# Patient Record
Sex: Female | Born: 1945 | Race: White | Hispanic: No | Smoking: Former smoker
Health system: Southern US, Community
[De-identification: ages and names within clinical notes are randomized; demographics above are authoritative.]

## PROBLEM LIST (undated history)

## (undated) DIAGNOSIS — C419 Malignant neoplasm of bone and articular cartilage, unspecified: Secondary | ICD-10-CM

## (undated) DIAGNOSIS — K219 Gastro-esophageal reflux disease without esophagitis: Secondary | ICD-10-CM

## (undated) DIAGNOSIS — C229 Malignant neoplasm of liver, not specified as primary or secondary: Secondary | ICD-10-CM

## (undated) DIAGNOSIS — I509 Heart failure, unspecified: Secondary | ICD-10-CM

## (undated) DIAGNOSIS — G4730 Sleep apnea, unspecified: Secondary | ICD-10-CM

## (undated) DIAGNOSIS — I10 Essential (primary) hypertension: Secondary | ICD-10-CM

## (undated) DIAGNOSIS — J449 Chronic obstructive pulmonary disease, unspecified: Secondary | ICD-10-CM

## (undated) DIAGNOSIS — E119 Type 2 diabetes mellitus without complications: Secondary | ICD-10-CM

## (undated) DIAGNOSIS — C801 Malignant (primary) neoplasm, unspecified: Secondary | ICD-10-CM

## (undated) DIAGNOSIS — H9209 Otalgia, unspecified ear: Secondary | ICD-10-CM

## (undated) DIAGNOSIS — C539 Malignant neoplasm of cervix uteri, unspecified: Secondary | ICD-10-CM

## (undated) DIAGNOSIS — H43399 Other vitreous opacities, unspecified eye: Secondary | ICD-10-CM

## (undated) DIAGNOSIS — N189 Chronic kidney disease, unspecified: Secondary | ICD-10-CM

## (undated) HISTORY — DX: Chronic obstructive pulmonary disease, unspecified: J44.9

## (undated) HISTORY — DX: Malignant neoplasm of bone and articular cartilage, unspecified: C41.9

## (undated) HISTORY — DX: Chronic kidney disease, unspecified: N18.9

## (undated) HISTORY — DX: Hypercalcemia: E83.52

## (undated) HISTORY — PX: EYE SURGERY: SHX253

## (undated) HISTORY — DX: Other vitreous opacities, unspecified eye: H43.399

## (undated) HISTORY — DX: Otalgia, unspecified ear: H92.09

## (undated) HISTORY — DX: Gastro-esophageal reflux disease without esophagitis: K21.9

## (undated) HISTORY — DX: Malignant (primary) neoplasm, unspecified: C80.1

## (undated) HISTORY — DX: Malignant neoplasm of cervix uteri, unspecified: C53.9

## (undated) HISTORY — PX: ABDOMINAL HYSTERECTOMY: SHX81

## (undated) HISTORY — DX: Type 2 diabetes mellitus without complications: E11.9

## (undated) HISTORY — DX: Essential (primary) hypertension: I10

## (undated) HISTORY — PX: BREAST SURGERY: SHX581

## (undated) HISTORY — DX: Malignant neoplasm of liver, not specified as primary or secondary: C22.9

---

## 2005-04-17 ENCOUNTER — Emergency Department (HOSPITAL_COMMUNITY): Admission: EM | Admit: 2005-04-17 | Discharge: 2005-04-17 | Payer: Self-pay | Admitting: Emergency Medicine

## 2005-11-03 ENCOUNTER — Ambulatory Visit: Payer: Self-pay | Admitting: Family Medicine

## 2005-11-28 ENCOUNTER — Ambulatory Visit: Payer: Self-pay | Admitting: Family Medicine

## 2005-12-28 ENCOUNTER — Ambulatory Visit: Payer: Self-pay | Admitting: Family Medicine

## 2006-05-28 ENCOUNTER — Other Ambulatory Visit: Payer: Self-pay

## 2006-05-28 ENCOUNTER — Emergency Department: Payer: Self-pay | Admitting: Emergency Medicine

## 2007-01-17 ENCOUNTER — Emergency Department (HOSPITAL_COMMUNITY): Admission: EM | Admit: 2007-01-17 | Discharge: 2007-01-17 | Payer: Self-pay | Admitting: Emergency Medicine

## 2007-01-18 ENCOUNTER — Ambulatory Visit (HOSPITAL_COMMUNITY): Admission: RE | Admit: 2007-01-18 | Discharge: 2007-01-18 | Payer: Self-pay | Admitting: Emergency Medicine

## 2007-11-12 ENCOUNTER — Emergency Department (HOSPITAL_COMMUNITY): Admission: EM | Admit: 2007-11-12 | Discharge: 2007-11-12 | Payer: Self-pay | Admitting: Emergency Medicine

## 2008-03-07 ENCOUNTER — Emergency Department: Payer: Self-pay | Admitting: Emergency Medicine

## 2008-03-07 ENCOUNTER — Other Ambulatory Visit: Payer: Self-pay

## 2009-11-10 ENCOUNTER — Emergency Department (HOSPITAL_COMMUNITY): Admission: EM | Admit: 2009-11-10 | Discharge: 2009-11-10 | Payer: Self-pay | Admitting: Emergency Medicine

## 2010-02-05 ENCOUNTER — Emergency Department: Payer: Self-pay | Admitting: Emergency Medicine

## 2010-03-30 ENCOUNTER — Ambulatory Visit: Payer: Self-pay | Admitting: Internal Medicine

## 2010-04-10 ENCOUNTER — Inpatient Hospital Stay: Payer: Self-pay | Admitting: Internal Medicine

## 2010-04-30 ENCOUNTER — Ambulatory Visit: Payer: Self-pay | Admitting: Internal Medicine

## 2010-06-23 ENCOUNTER — Inpatient Hospital Stay: Payer: Self-pay | Admitting: *Deleted

## 2010-08-30 ENCOUNTER — Observation Stay: Payer: Self-pay | Admitting: Internal Medicine

## 2010-09-17 LAB — DIFFERENTIAL
Basophils Relative: 1 % (ref 0–1)
Eosinophils Absolute: 0.7 10*3/uL (ref 0.0–0.7)
Eosinophils Relative: 7 % — ABNORMAL HIGH (ref 0–5)
Monocytes Relative: 6 % (ref 3–12)

## 2010-09-17 LAB — BASIC METABOLIC PANEL
CO2: 24 mEq/L (ref 19–32)
Calcium: 8.7 mg/dL (ref 8.4–10.5)
GFR calc Af Amer: 52 mL/min — ABNORMAL LOW (ref >60)
GFR calc non Af Amer: 43 mL/min — ABNORMAL LOW (ref >60)
Glucose, Bld: 188 mg/dL — ABNORMAL HIGH (ref 70–99)
Potassium: 4 mEq/L (ref 3.5–5.1)
Sodium: 138 mEq/L (ref 135–145)

## 2010-09-17 LAB — CBC
Platelets: 270 10*3/uL (ref 150–400)
RBC: 3.42 MIL/uL — ABNORMAL LOW (ref 3.87–5.11)

## 2010-12-13 ENCOUNTER — Inpatient Hospital Stay: Payer: Self-pay | Admitting: Internal Medicine

## 2011-02-06 ENCOUNTER — Emergency Department: Payer: Self-pay | Admitting: Internal Medicine

## 2011-03-26 LAB — URINALYSIS, ROUTINE W REFLEX MICROSCOPIC
Bilirubin Urine: NEGATIVE
pH: 7

## 2011-04-14 LAB — URINALYSIS, ROUTINE W REFLEX MICROSCOPIC
Bilirubin Urine: NEGATIVE
Glucose, UA: NEGATIVE
Hgb urine dipstick: NEGATIVE
Nitrite: NEGATIVE
Specific Gravity, Urine: 1.025
Urobilinogen, UA: 0.2
pH: 6

## 2011-04-14 LAB — COMPREHENSIVE METABOLIC PANEL
ALT: 40 — ABNORMAL HIGH
AST: 34
Albumin: 3.4 — ABNORMAL LOW
Alkaline Phosphatase: 82
BUN: 14
CO2: 26
Calcium: 8.6
Chloride: 101
Creatinine, Ser: 0.81
GFR calc Af Amer: >60
GFR calc non Af Amer: >60
Glucose, Bld: 171 — ABNORMAL HIGH
Potassium: 3.4 — ABNORMAL LOW
Sodium: 135
Total Bilirubin: 0.8
Total Protein: 6.2

## 2011-04-14 LAB — CBC
Hemoglobin: 13.9
Platelets: 307

## 2011-04-14 LAB — DIFFERENTIAL
Basophils Absolute: 0
Basophils Relative: 0
Eosinophils Absolute: 0.1
Eosinophils Relative: 1
Lymphocytes Relative: 17
Lymphs Abs: 1.4
Monocytes Absolute: 0.6
Monocytes Relative: 7
Neutro Abs: 6.2
Neutrophils Relative %: 75

## 2011-04-14 LAB — LIPASE, BLOOD: Lipase: 12

## 2011-05-06 ENCOUNTER — Emergency Department: Payer: Self-pay | Admitting: Emergency Medicine

## 2011-07-09 ENCOUNTER — Emergency Department: Payer: Self-pay | Admitting: Unknown Physician Specialty

## 2011-07-09 LAB — CBC
MCH: 28.7 pg (ref 26.0–34.0)
MCV: 89 fL (ref 80–100)
Platelet: 310 10*3/uL (ref 150–440)
RBC: 4.53 10*6/uL (ref 3.80–5.20)
RDW: 14.6 % — ABNORMAL HIGH (ref 11.5–14.5)
WBC: 10 10*3/uL (ref 3.6–11.0)

## 2011-07-10 LAB — COMPREHENSIVE METABOLIC PANEL
Albumin: 3.7 g/dL (ref 3.4–5.0)
Alkaline Phosphatase: 66 U/L (ref 50–136)
BUN: 14 mg/dL (ref 7–18)
Bilirubin,Total: 0.3 mg/dL (ref 0.2–1.0)
Calcium, Total: 9.1 mg/dL (ref 8.5–10.1)
Creatinine: 1.01 mg/dL (ref 0.60–1.30)
EGFR (Non-African Amer.): 58 — ABNORMAL LOW
Glucose: 107 mg/dL — ABNORMAL HIGH (ref 65–99)
Osmolality: 278 (ref 275–301)
Sodium: 139 mmol/L (ref 136–145)
Total Protein: 7.6 g/dL (ref 6.4–8.2)

## 2011-07-10 LAB — TROPONIN I: Troponin-I: <0.02 ng/mL

## 2011-11-22 ENCOUNTER — Observation Stay: Payer: Self-pay | Admitting: Internal Medicine

## 2011-11-22 LAB — COMPREHENSIVE METABOLIC PANEL
Albumin: 3.1 g/dL — ABNORMAL LOW (ref 3.4–5.0)
Anion Gap: 11 (ref 7–16)
Bilirubin,Total: 0.2 mg/dL (ref 0.2–1.0)
Calcium, Total: 8.6 mg/dL (ref 8.5–10.1)
Co2: 29 mmol/L (ref 21–32)
Creatinine: 1.02 mg/dL (ref 0.60–1.30)
EGFR (African American): >60
EGFR (Non-African Amer.): 57 — ABNORMAL LOW
Glucose: 303 mg/dL — ABNORMAL HIGH (ref 65–99)
Potassium: 4 mmol/L (ref 3.5–5.1)
SGOT(AST): 16 U/L (ref 15–37)
Total Protein: 7.1 g/dL (ref 6.4–8.2)

## 2011-11-22 LAB — CBC
HCT: 38.8 % (ref 35.0–47.0)
HGB: 11.9 g/dL — ABNORMAL LOW (ref 12.0–16.0)
MCH: 28.2 pg (ref 26.0–34.0)
MCHC: 30.7 g/dL — ABNORMAL LOW (ref 32.0–36.0)
Platelet: 276 10*3/uL (ref 150–440)
RDW: 13.1 % (ref 11.5–14.5)
WBC: 13.9 10*3/uL — ABNORMAL HIGH (ref 3.6–11.0)

## 2011-11-22 LAB — TROPONIN I
Troponin-I: <0.02 ng/mL
Troponin-I: <0.02 ng/mL

## 2011-11-22 LAB — CK TOTAL AND CKMB (NOT AT ARMC): CK, Total: 53 U/L (ref 21–215)

## 2011-11-22 LAB — PRO B NATRIURETIC PEPTIDE: B-Type Natriuretic Peptide: 243 pg/mL — ABNORMAL HIGH (ref 0–125)

## 2011-11-22 LAB — PROTIME-INR
INR: 0.8
Prothrombin Time: 11.8 secs (ref 11.5–14.7)

## 2011-11-22 LAB — APTT: Activated PTT: 27.7 secs (ref 23.6–35.9)

## 2012-02-25 ENCOUNTER — Inpatient Hospital Stay: Payer: Self-pay | Admitting: Internal Medicine

## 2012-02-25 LAB — CBC
MCH: 29.1 pg (ref 26.0–34.0)
MCHC: 32.4 g/dL (ref 32.0–36.0)
Platelet: 261 10*3/uL (ref 150–440)
RBC: 3.86 10*6/uL (ref 3.80–5.20)
RDW: 14.7 % — ABNORMAL HIGH (ref 11.5–14.5)

## 2012-02-25 LAB — COMPREHENSIVE METABOLIC PANEL
Albumin: 3 g/dL — ABNORMAL LOW (ref 3.4–5.0)
Anion Gap: 7 (ref 7–16)
BUN: 17 mg/dL (ref 7–18)
EGFR (African American): 60 — ABNORMAL LOW
EGFR (Non-African Amer.): 52 — ABNORMAL LOW
Glucose: 190 mg/dL — ABNORMAL HIGH (ref 65–99)
Osmolality: 280 (ref 275–301)
Potassium: 4.5 mmol/L (ref 3.5–5.1)
SGOT(AST): 26 U/L (ref 15–37)
Sodium: 137 mmol/L (ref 136–145)

## 2012-02-25 LAB — URINALYSIS, COMPLETE
Bilirubin,UR: NEGATIVE
Ketone: NEGATIVE
Protein: NEGATIVE
Specific Gravity: 1.019 (ref 1.003–1.030)
Squamous Epithelial: 8
WBC UR: 16 /HPF (ref 0–5)

## 2012-02-25 LAB — MAGNESIUM: Magnesium: 1.5 mg/dL — ABNORMAL LOW

## 2012-02-25 LAB — CK TOTAL AND CKMB (NOT AT ARMC): CK-MB: 0.6 ng/mL (ref 0.5–3.6)

## 2012-02-26 LAB — CBC WITH DIFFERENTIAL/PLATELET
Basophil %: 0.1 %
Eosinophil #: 0 10*3/uL (ref 0.0–0.7)
MCH: 28.9 pg (ref 26.0–34.0)
MCHC: 31.7 g/dL — ABNORMAL LOW (ref 32.0–36.0)
Monocyte #: 0.1 x10 3/mm — ABNORMAL LOW (ref 0.2–0.9)
Neutrophil %: 93.7 %
Platelet: 265 10*3/uL (ref 150–440)
RDW: 14.5 % (ref 11.5–14.5)

## 2012-02-26 LAB — BASIC METABOLIC PANEL
Calcium, Total: 8.7 mg/dL (ref 8.5–10.1)
Chloride: 99 mmol/L (ref 98–107)
Co2: 26 mmol/L (ref 21–32)
Creatinine: 1.26 mg/dL (ref 0.60–1.30)
EGFR (African American): 51 — ABNORMAL LOW
Sodium: 136 mmol/L (ref 136–145)

## 2012-02-26 LAB — LIPID PANEL
HDL Cholesterol: 56 mg/dL (ref 40–60)
Ldl Cholesterol, Calc: 117 mg/dL — ABNORMAL HIGH (ref 0–100)
Triglycerides: 79 mg/dL (ref 0–200)

## 2012-03-02 LAB — CULTURE, BLOOD (SINGLE)

## 2013-01-17 ENCOUNTER — Ambulatory Visit: Payer: Self-pay

## 2013-04-19 ENCOUNTER — Inpatient Hospital Stay: Payer: Self-pay | Admitting: Internal Medicine

## 2013-04-19 LAB — BASIC METABOLIC PANEL
Anion Gap: 5 — ABNORMAL LOW (ref 7–16)
BUN: 12 mg/dL (ref 7–18)
Chloride: 100 mmol/L (ref 98–107)
Co2: 30 mmol/L (ref 21–32)
Creatinine: 0.96 mg/dL (ref 0.60–1.30)
EGFR (African American): >60
EGFR (Non-African Amer.): >60
Glucose: 187 mg/dL — ABNORMAL HIGH (ref 65–99)
Osmolality: 275 (ref 275–301)
Sodium: 135 mmol/L — ABNORMAL LOW (ref 136–145)

## 2013-04-19 LAB — CBC
HCT: 36.8 % (ref 35.0–47.0)
HGB: 12.2 g/dL (ref 12.0–16.0)
MCH: 29.1 pg (ref 26.0–34.0)
MCV: 88 fL (ref 80–100)
RBC: 4.18 10*6/uL (ref 3.80–5.20)
RDW: 13.8 % (ref 11.5–14.5)
WBC: 11.9 10*3/uL — ABNORMAL HIGH (ref 3.6–11.0)

## 2013-04-20 LAB — CBC WITH DIFFERENTIAL/PLATELET
Basophil #: 0.1 10*3/uL (ref 0.0–0.1)
HCT: 34.7 % — ABNORMAL LOW (ref 35.0–47.0)
HGB: 11.5 g/dL — ABNORMAL LOW (ref 12.0–16.0)
Lymphocyte %: 6.1 %
MCH: 29.1 pg (ref 26.0–34.0)
MCHC: 33.3 g/dL (ref 32.0–36.0)
Neutrophil #: 12.4 10*3/uL — ABNORMAL HIGH (ref 1.4–6.5)
Neutrophil %: 92.4 %
Platelet: 293 10*3/uL (ref 150–440)
RBC: 3.96 10*6/uL (ref 3.80–5.20)
WBC: 13.4 10*3/uL — ABNORMAL HIGH (ref 3.6–11.0)

## 2013-04-20 LAB — BASIC METABOLIC PANEL
Chloride: 100 mmol/L (ref 98–107)
Co2: 25 mmol/L (ref 21–32)
Creatinine: 1.2 mg/dL (ref 0.60–1.30)
EGFR (African American): 54 — ABNORMAL LOW
Osmolality: 282 (ref 275–301)
Potassium: 4 mmol/L (ref 3.5–5.1)

## 2013-05-23 ENCOUNTER — Inpatient Hospital Stay: Payer: Self-pay | Admitting: Internal Medicine

## 2013-05-23 LAB — CBC
HGB: 12.2 g/dL (ref 12.0–16.0)
MCV: 89 fL (ref 80–100)
RBC: 4.27 10*6/uL (ref 3.80–5.20)
RDW: 14.3 % (ref 11.5–14.5)

## 2013-05-23 LAB — COMPREHENSIVE METABOLIC PANEL
Alkaline Phosphatase: 127 U/L — ABNORMAL HIGH
BUN: 23 mg/dL — ABNORMAL HIGH (ref 7–18)
Calcium, Total: 9 mg/dL (ref 8.5–10.1)
Chloride: 98 mmol/L (ref 98–107)
Creatinine: 1.07 mg/dL (ref 0.60–1.30)
EGFR (African American): >60
EGFR (Non-African Amer.): 54 — ABNORMAL LOW
Glucose: 177 mg/dL — ABNORMAL HIGH (ref 65–99)
Osmolality: 278 (ref 275–301)
Potassium: 3.3 mmol/L — ABNORMAL LOW (ref 3.5–5.1)
SGOT(AST): 18 U/L (ref 15–37)
SGPT (ALT): 22 U/L (ref 12–78)
Sodium: 135 mmol/L — ABNORMAL LOW (ref 136–145)
Total Protein: 7.2 g/dL (ref 6.4–8.2)

## 2013-05-23 LAB — DIFFERENTIAL
Lymphocytes: 18 %
Monocytes: 3 %
Segmented Neutrophils: 77 %

## 2013-05-23 LAB — TROPONIN I: Troponin-I: <0.02 ng/mL

## 2013-05-24 ENCOUNTER — Ambulatory Visit: Payer: Self-pay | Admitting: Hospice and Palliative Medicine

## 2013-05-24 DIAGNOSIS — I059 Rheumatic mitral valve disease, unspecified: Secondary | ICD-10-CM

## 2013-05-24 LAB — TSH: Thyroid Stimulating Horm: 0.833 u[IU]/mL

## 2013-05-24 LAB — BASIC METABOLIC PANEL
Calcium, Total: 8.4 mg/dL — ABNORMAL LOW (ref 8.5–10.1)
Chloride: 113 mmol/L — ABNORMAL HIGH (ref 98–107)
Co2: 26 mmol/L (ref 21–32)
Creatinine: 1.18 mg/dL (ref 0.60–1.30)
EGFR (Non-African Amer.): 48 — ABNORMAL LOW
Glucose: 482 mg/dL — ABNORMAL HIGH (ref 65–99)
Osmolality: 323 (ref 275–301)
Potassium: 4.6 mmol/L (ref 3.5–5.1)

## 2013-05-24 LAB — CBC WITH DIFFERENTIAL/PLATELET
Basophil #: 0 10*3/uL (ref 0.0–0.1)
Basophil %: 0.1 %
HCT: 35.5 % (ref 35.0–47.0)
HGB: 11.4 g/dL — ABNORMAL LOW (ref 12.0–16.0)
Lymphocyte %: 2.5 %
MCHC: 32.1 g/dL (ref 32.0–36.0)
MCV: 90 fL (ref 80–100)
Monocyte #: 0.1 x10 3/mm — ABNORMAL LOW (ref 0.2–0.9)
Monocyte %: 0.8 %
Neutrophil %: 96.6 %
RDW: 14.6 % — ABNORMAL HIGH (ref 11.5–14.5)

## 2013-05-24 LAB — HEMOGLOBIN A1C: Hemoglobin A1C: 10.1 % — ABNORMAL HIGH (ref 4.2–6.3)

## 2013-05-25 LAB — BASIC METABOLIC PANEL
BUN: 27 mg/dL — ABNORMAL HIGH (ref 7–18)
Calcium, Total: 9 mg/dL (ref 8.5–10.1)
Chloride: 98 mmol/L (ref 98–107)
Co2: 30 mmol/L (ref 21–32)
Creatinine: 0.9 mg/dL (ref 0.60–1.30)
EGFR (African American): >60
EGFR (Non-African Amer.): >60
Sodium: 134 mmol/L — ABNORMAL LOW (ref 136–145)

## 2013-05-25 LAB — CBC WITH DIFFERENTIAL/PLATELET
Basophil #: 0 10*3/uL (ref 0.0–0.1)
Eosinophil #: 0 10*3/uL (ref 0.0–0.7)
Eosinophil %: 0 %
HGB: 11.4 g/dL — ABNORMAL LOW (ref 12.0–16.0)
MCH: 28.3 pg (ref 26.0–34.0)
MCV: 89 fL (ref 80–100)
Monocyte %: 2.8 %
Neutrophil %: 92.1 %
Platelet: 336 10*3/uL (ref 150–440)
RBC: 4.02 10*6/uL (ref 3.80–5.20)
WBC: 23.3 10*3/uL — ABNORMAL HIGH (ref 3.6–11.0)

## 2013-05-27 LAB — HEMOGLOBIN: HGB: 11.8 g/dL — ABNORMAL LOW (ref 12.0–16.0)

## 2013-05-28 LAB — CULTURE, BLOOD (SINGLE)

## 2013-05-29 LAB — CREATININE, SERUM
Creatinine: 0.93 mg/dL (ref 0.60–1.30)
EGFR (African American): >60
EGFR (Non-African Amer.): >60

## 2013-05-30 ENCOUNTER — Ambulatory Visit: Payer: Self-pay | Admitting: Hospice and Palliative Medicine

## 2013-05-31 LAB — EXPECTORATED SPUTUM ASSESSMENT W REFEX TO RESP CULTURE

## 2013-09-01 IMAGING — CR DG CHEST 1V PORT
1 series · 1 of 1 positions shown · non-contrast
Comparison: none

REASON FOR EXAM: Chest Pain
COMMENTS:

[portable]
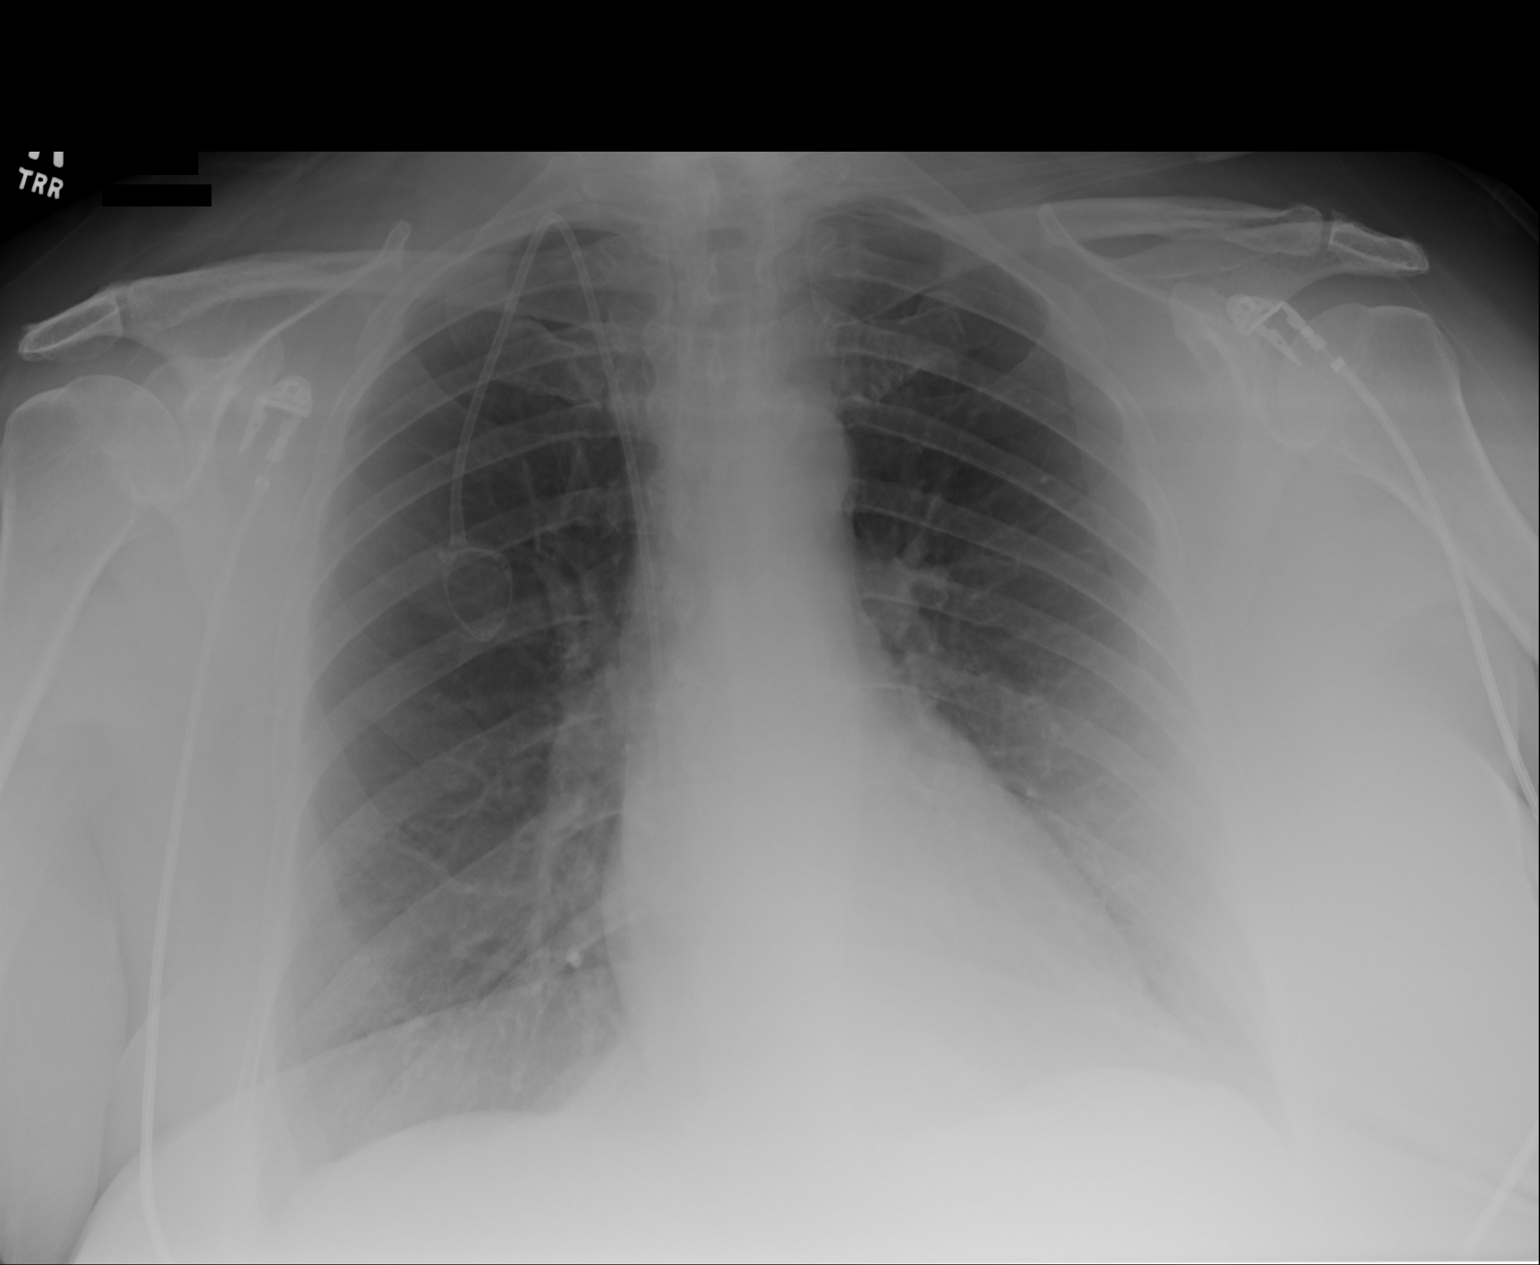

[1 of 1 positions shown; findings below may reference images not displayed]

PROCEDURE:     DXR - DXR PORTABLE CHEST SINGLE VIEW  - November 22, 2011  [DATE]

RESULT:     The lungs are clear. The cardiac silhouette and visualized bony
skeleton are unremarkable. A right-sided Port-A-Cath is appreciated with tip
projecting in the region of the superior vena caval right atrial junction.
IMPRESSION: 1. Chest radiograph without evidence of acute cardiopulmonary disease.
2. Comparison made to prior study dated 07/09/2011.

## 2014-03-24 ENCOUNTER — Inpatient Hospital Stay: Payer: Self-pay | Admitting: Internal Medicine

## 2014-03-24 LAB — CBC WITH DIFFERENTIAL/PLATELET
BASOS PCT: 0.4 %
Basophil #: 0 10*3/uL (ref 0.0–0.1)
EOS ABS: 0.5 10*3/uL (ref 0.0–0.7)
Eosinophil %: 4.3 %
HCT: 33.9 % — ABNORMAL LOW (ref 35.0–47.0)
HGB: 10.8 g/dL — AB (ref 12.0–16.0)
LYMPHS PCT: 16.8 %
Lymphocyte #: 1.9 10*3/uL (ref 1.0–3.6)
MCH: 28.6 pg (ref 26.0–34.0)
MCHC: 31.9 g/dL — ABNORMAL LOW (ref 32.0–36.0)
MCV: 90 fL (ref 80–100)
Monocyte #: 0.9 x10 3/mm (ref 0.2–0.9)
Monocyte %: 7.7 %
Neutrophil #: 8.1 10*3/uL — ABNORMAL HIGH (ref 1.4–6.5)
Neutrophil %: 70.8 %
Platelet: 264 10*3/uL (ref 150–440)
RBC: 3.77 10*6/uL — AB (ref 3.80–5.20)
RDW: 13.6 % (ref 11.5–14.5)
WBC: 11.5 10*3/uL — ABNORMAL HIGH (ref 3.6–11.0)

## 2014-03-24 LAB — URINALYSIS, COMPLETE
BLOOD: NEGATIVE
Bilirubin,UR: NEGATIVE
KETONE: NEGATIVE
Leukocyte Esterase: NEGATIVE
Nitrite: NEGATIVE
PROTEIN: NEGATIVE
Ph: 5 (ref 4.5–8.0)
RBC,UR: <1 /HPF (ref 0–5)
SPECIFIC GRAVITY: 1.048 (ref 1.003–1.030)

## 2014-03-24 LAB — COMPREHENSIVE METABOLIC PANEL
AST: 17 U/L (ref 15–37)
Albumin: 2.9 g/dL — ABNORMAL LOW (ref 3.4–5.0)
Alkaline Phosphatase: 90 U/L
Anion Gap: 7 (ref 7–16)
BUN: 10 mg/dL (ref 7–18)
Bilirubin,Total: 0.3 mg/dL (ref 0.2–1.0)
CO2: 29 mmol/L (ref 21–32)
Calcium, Total: 7.7 mg/dL — ABNORMAL LOW (ref 8.5–10.1)
Chloride: 105 mmol/L (ref 98–107)
Creatinine: 0.97 mg/dL (ref 0.60–1.30)
EGFR (African American): >60
Glucose: 163 mg/dL — ABNORMAL HIGH (ref 65–99)
Osmolality: 284 (ref 275–301)
Potassium: 3.7 mmol/L (ref 3.5–5.1)
SGPT (ALT): 19 U/L
Sodium: 141 mmol/L (ref 136–145)
Total Protein: 6.5 g/dL (ref 6.4–8.2)

## 2014-03-24 LAB — CK TOTAL AND CKMB (NOT AT ARMC)
CK, Total: 75 U/L
CK-MB: 0.8 ng/mL (ref 0.5–3.6)

## 2014-03-24 LAB — D-DIMER(ARMC): D-Dimer: 468 ng/ml

## 2014-03-24 LAB — HEMOGLOBIN A1C: HEMOGLOBIN A1C: 8.4 % — AB (ref 4.2–6.3)

## 2014-03-24 LAB — TROPONIN I

## 2014-03-25 LAB — BASIC METABOLIC PANEL
ANION GAP: 7 (ref 7–16)
BUN: 12 mg/dL (ref 7–18)
CREATININE: 0.95 mg/dL (ref 0.60–1.30)
Calcium, Total: 8.2 mg/dL — ABNORMAL LOW (ref 8.5–10.1)
Chloride: 101 mmol/L (ref 98–107)
Co2: 29 mmol/L (ref 21–32)
EGFR (African American): >60
EGFR (Non-African Amer.): >60
Glucose: 251 mg/dL — ABNORMAL HIGH (ref 65–99)
Osmolality: 282 (ref 275–301)
POTASSIUM: 4.2 mmol/L (ref 3.5–5.1)
SODIUM: 137 mmol/L (ref 136–145)

## 2014-03-25 LAB — CBC WITH DIFFERENTIAL/PLATELET
BASOS ABS: 0 10*3/uL (ref 0.0–0.1)
BASOS PCT: 0.1 %
Eosinophil #: 0 10*3/uL (ref 0.0–0.7)
Eosinophil %: 0 %
HCT: 34.5 % — AB (ref 35.0–47.0)
HGB: 11.1 g/dL — AB (ref 12.0–16.0)
Lymphocyte #: 0.8 10*3/uL — ABNORMAL LOW (ref 1.0–3.6)
Lymphocyte %: 5 %
MCH: 29.1 pg (ref 26.0–34.0)
MCHC: 32.3 g/dL (ref 32.0–36.0)
MCV: 90 fL (ref 80–100)
Monocyte #: 0.2 x10 3/mm (ref 0.2–0.9)
Monocyte %: 1.5 %
NEUTROS ABS: 14.3 10*3/uL — AB (ref 1.4–6.5)
Neutrophil %: 93.4 %
Platelet: 269 10*3/uL (ref 150–440)
RBC: 3.83 10*6/uL (ref 3.80–5.20)
RDW: 13.6 % (ref 11.5–14.5)
WBC: 15.3 10*3/uL — AB (ref 3.6–11.0)

## 2014-03-25 LAB — MAGNESIUM: Magnesium: 1.4 mg/dL — ABNORMAL LOW

## 2014-03-28 LAB — PLATELET COUNT: Platelet: 274 10*3/uL (ref 150–440)

## 2014-06-18 ENCOUNTER — Emergency Department: Payer: Self-pay | Admitting: Emergency Medicine

## 2014-06-18 LAB — CBC
HCT: 37.9 % (ref 35.0–47.0)
HGB: 12 g/dL (ref 12.0–16.0)
MCH: 28.8 pg (ref 26.0–34.0)
MCHC: 31.8 g/dL — ABNORMAL LOW (ref 32.0–36.0)
MCV: 91 fL (ref 80–100)
Platelet: 315 10*3/uL (ref 150–440)
RBC: 4.18 10*6/uL (ref 3.80–5.20)
RDW: 13.7 % (ref 11.5–14.5)
WBC: 12.2 10*3/uL — ABNORMAL HIGH (ref 3.6–11.0)

## 2014-06-18 LAB — BASIC METABOLIC PANEL
ANION GAP: 5 — AB (ref 7–16)
BUN: 12 mg/dL (ref 7–18)
CO2: 29 mmol/L (ref 21–32)
Calcium, Total: 8.2 mg/dL — ABNORMAL LOW (ref 8.5–10.1)
Chloride: 102 mmol/L (ref 98–107)
Creatinine: 0.98 mg/dL (ref 0.60–1.30)
EGFR (African American): >60
GFR CALC NON AF AMER: 60 — AB
Glucose: 209 mg/dL — ABNORMAL HIGH (ref 65–99)
Osmolality: 278 (ref 275–301)
POTASSIUM: 4.1 mmol/L (ref 3.5–5.1)
Sodium: 136 mmol/L (ref 136–145)

## 2014-06-18 LAB — TROPONIN I: Troponin-I: <0.02 ng/mL

## 2014-10-17 NOTE — H&P (Signed)
PATIENT NAME:  Sherry Prince, OFFENBERGER MR#:  [id number] DATE OF BIRTH:  Dec 11, 1945  DATE OF ADMISSION:  02/25/2012  PULMONOLOGIST: Dr. Humphrey Rolls   PRESENTING COMPLAINT: Short of breath.  HISTORY OF PRESENTING COMPLAINT: This is a 69 year old female with past medical history of chronic obstructive pulmonary disease, stage IV lung cancer with metastasis to liver, bone and lymph nodes, congestive heart failure, hypertension, diabetes, obesity, obstructive sleep apnea, depression, restless leg syndrome. For last few days she is not feeling well and she is short of breath so she went to her pulmonary doctor. He is Dr. Humphrey Rolls. He gave her a treatment of nebulization in the office but she still continued feeling short of breath. Usually she had to use her oxygen only at night with her CPAP machine but for last few days she is feeling increasingly short of breath and needs more frequent oxygen and her inhaler is not relieving the symptoms so she decided to come to the Emergency Room. She denies any fever. She has no cough or sputum production. No sick contacts and no travel history. She had mild chest pain but no palpitations. Denies any leg edema. No nausea, vomiting, diarrhea, abdominal pain.   REVIEW OF SYSTEMS: CONSTITUTIONAL: Denies fever, weakness, weight loss. EYES: No blurring or double vision. No pain or redness. ENT: No tinnitus, ear pain, hearing loss or discharge. RESPIRATORY: No cough. She has wheezing. No hemoptysis. CARDIOVASCULAR: Mild chest pain. No edema on the legs. No palpitations. GASTROINTESTINAL: No nausea, vomiting, diarrhea, abdominal pain, hematemesis, melena. GENITOURINARY: No dysuria. No hematuria. No increased frequency. HEMATOLOGY: Denies any anemia, easy bleeding or bruising. MUSCULOSKELETAL: No pain or swelling in the joints. NEUROLOGICAL: No numbness, weakness, tingling or loss of balance. PSYCH: Denies any anxiety or insomnia or nervousness.   PAST MEDICAL HISTORY: 1. Chronic obstructive  pulmonary disease. 2. Emphysema.  3. Chronic smoker.  4. Lung cancer stage IV, mets to liver, bone and lymph node.  5. Congestive heart failure.  6. Obesity. 7. Obstructive sleep apnea.  8. Restless leg syndrome.  9. Diabetes.  10. Hypertension.  PAST SURGICAL HISTORY:  1. Hysterectomy.  2. Hand surgery on left hand because of injury.   ALLERGIES: Allergic to penicillin.  SOCIAL HISTORY: She used to smoke three packs a day, almost smoked for 35 years and quit 12 years ago. Denies use of alcohol. Lives alone. Has to use hospital bed at night with CPAP and oxygen.   HOME MEDICATIONS: 1. Albuterol 3 mL inhaled q.6 hours as needed for short of breath. 2. Citalopram 40 mg once a day. 3. Furosemide 40 mg once a day. 4. Hydroxyzine 25 mg q.6 hours as needed. 5. Lisinopril 10 mg twice a day. 6. Loratadine 10 mg once a day. 7. Lorazepam 1 mg at bedtime.  8. Metformin 1000 mg two times a day. 9. Nitrostat 0.4 mg sublingual q.5 minutes up to three doses as needed for chest pain.  10. Omeprazole 20 mg two times a day.  11. Promethazine 25 mg q.6 hours as needed.  12. Simvastatin 40 mg once a day. 13. Spiriva 18 mcg once a day.  14. Trazodone 50 mg once a day.   PHYSICAL EXAMINATION: VITAL SIGNS: Blood pressure 140/90, heart rate 98, respirations 24, temperature 99.2.  GENERAL: Morbidly obese female with mild respiratory distress, fully alert and oriented and cooperative.   HEAD AND NECK: No signs of trauma. Conjunctiva pink. Oral mucosa wet.   NECK: No jugular venous distention.   RESPIRATORY: Bilateral equal air entry. Expiratory  rhonchi present bilaterally. No crepitations. Nontender on palpation. Using nasal cannula 2 liters oxygen.   CARDIOVASCULAR: Regular rate and rhythm. No murmur. No edema on the legs.   ABDOMEN: Obesity, nontender. Bowel sounds present.   MUSCULOSKELETAL: Joints nontender, non-swollen.   SKIN: No rashes.   NEUROLOGICAL: Cranial nerves intact. All  fours limbs power normal. Follows command. No tremor.   PSYCH: Does not appear in any gross distress. Fully cooperative.   LABORATORY, DIAGNOSTIC AND RADIOLOGICAL DATA: Glucose 190, BNP 432, BUN 17, creatinine 1.1, sodium 137, potassium 4.5, chloride 101, carbon dioxide 29, calcium 8.7, magnesium 1.5, total protein 7, albumin 3, bilirubin 0.2, alkaline phosphatase 101, SGOT 26, SGPT 22, WBC 11.8, RBC 3.86, hemoglobin 11.2, hematocrit 34.6, platelet count 261, MCV 90. WBC 16 on urinalysis. Chest x-ray relatively increased density and retrocardiac opacity likely artifactual related to overlying soft tissue, however, other etiologies such as atelectasis or infection cannot be excluded.   ASSESSMENT: 69 year old female with multiple medical comorbidities presented with chronic obstructive pulmonary disease exacerbation.  PLAN: 1. Chronic obstructive pulmonary disease exacerbation. IV steroids. IV Levaquin. DuoNeb nebulized solution q.6 hours. Continue CPAP at night. Oxygen via nasal cannula. Spiriva. Pulmonary consult with Dr. Humphrey Rolls.  2. Diabetes. We will not give metformin in the hospital. Will put her on sliding scale as her blood sugar level is expected to be high on high dose of IV steroids.  3. Hypertension. Continue home medication; lisinopril and furosemide.  4. Congestive heart failure. Ejection fraction 50% as nuclear medicine studies in past. Right now she is not in any signs of congestive heart failure exacerbation, no fluid retention so will continue home medication, furosemide.  5. Depression. Will continue citalopram.  6. DVT prophylaxis with Lovenox. GI prophylaxis with pantoprazole.  7. Stage IV lung cancer with mets, stable. Follow up with pulmonology.   8. Health care proxy, daughter-in-law.  9. Patient's wish is DO NOT RESUSCITATE.   TOTAL TIME SPENT: 50 minutes.   ____________________________ Ceasar Lund Anselm Jungling, MD vgv:cms D: 02/25/2012 21:30:30 ET T: 02/26/2012 06:43:29  ET JOB#: [id number]  cc: Ceasar Lund. Anselm Jungling, MD, <Dictator> Vaughan Basta MD ELECTRONICALLY SIGNED 03/17/2012 14:40

## 2014-10-17 NOTE — Discharge Summary (Signed)
PATIENT NAME:  Sherry Prince, Sherry Prince MR#:  [id number] DATE OF BIRTH:  Oct 19, 1945  DATE OF ADMISSION:  02/25/2012 DATE OF DISCHARGE:  02/27/2012  ADMITTING DIAGNOSIS: Chronic obstructive pulmonary disease exacerbation.   DISCHARGE DIAGNOSES:  1. Acute on chronic respiratory failure due to chronic obstructive pulmonary disease exacerbation.  2. Acute bronchitis.  3. Congestive heart failure, left heart, acute on chronic, diastolic.  4. Leukocytosis.  5. Anemia. 6. Hypertension. 7. Diabetes mellitus. 8. Obesity. 9. Obstructive sleep apnea.  10. Restless leg syndrome.   DISCHARGE CONDITION: Stable.   DISCHARGE MEDICATIONS: 1. Spiriva one capsule inhalation daily.  2. Omeprazole 20 mg p.o. twice a day. 3. Metformin 1000 mg p.o. twice daily.  4. Simvastatin 40 mg p.o. daily.  5. Nitrostat 0.4 mg sublingual every five minutes as needed.  6. Albuterol inhalation solution 3 mL every six hours as needed.  7. Lorazepam 1 mg p.o. at bedtime for restless legs. 8. Citalopram 40 mg p.o. daily.  9. Hydroxyzine 25 mg p.o. every six hours as needed.  10. Lisinopril 10 mg p.o. twice daily.  11. Trazodone 50 mg p.o. at bedtime p.r.n.  12. Promethazine 25 mg 1/2 to 1 tablet every six hours as needed.  13. Loratadine 10 mg p.o. in the morning daily.  14. Furosemide 40 mg p.o. twice daily.  15. Prednisone 40 mg p.o. once then taper x 10 mg daily until stopped.  16. Budesonide/formoterol 160/4.5 two puffs twice daily.  17. Levofloxacin 250 mg p.o. once daily for five more days.  DIET: 2 gram salt, low fat, low cholesterol diet, consistency regular.   ACTIVITY LIMITATIONS: As tolerated.   DISCHARGE FOLLOWUP: Followup with Dr. Albertine Patricia in two days after discharge.   CONSULTANTS: Social Work.  RADIOLOGIC STUDIES: Chest x-ray, portable single view, 02/25/2012, showed relative increased density and retrocardiac opacity likely artifactual related to overlying soft tissue. However, other etiologies such as  atelectasis or infection cannot be excluded. Followup PA and lateral chest radiographs are recommended.   Repeat chest x-ray, PA and lateral, on 02/27/2012, showed mild hyperinflation which can be consistent with chronic obstructive pulmonary disease, Port-A-Cath device present, and no acute cardiopulmonary disease evident.   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 Please refer to Dr. Nichola Sizer admission note on 02/25/2012.  On arrival to the Emergency Room, the patient's blood pressure was 140/90, heart rate was 98, respiratory rate was 74, and temperature was 99.2. Her physical exam revealed bilateral equal air entrance on respiratory examination and expiratory rhonchi were present but no crepitations. She was using 2 liters of oxygen through nasal cannula.  LABORATORY, DIAGNOSTIC AND RADIOLOGIC DATA: Glucose was 190. BNP was 432. Otherwise, BMP was unremarkable. The patient's magnesium level was low at 1.5. Liver enzymes showed albumin level of 3.0, otherwise unremarkable. Cardiac enzymes were unremarkable. The patient's white blood cell count was slightly elevated at 11.8, hemoglobin was 11.2, and platelet count 261.   Blood cultures x2 taken on 02/25/2012 showed no growth.   Urinalysis revealed yellow hazy urine, 150 mg/dl glucose, negative for bilirubin or ketones, specific gravity 1.019, pH 6.0, negative for blood, protein, and nitrites, 1+ leukocyte esterase, 4 red blood cells, 16 white blood cells, trace bacteria was seen, and 8 epithelial cells were also noted.   EKG  showed normal sinus rhythm at 98 beats per minute, normal axis, and no acute ST-T changes were noted.   The patient's chest x-ray was concerning for  possible infiltrate.  HOSPITAL COURSE: The patient was admitted to the hospital for further evaluation and chronic obstructive pulmonary disease exacerbation. She was started on steroids, DuoNebs, and Spiriva, as well as Levaquin IV. She was continued on oxygen and BiPAP at night. With this therapy, she somewhat improved, but not significantly enough. She was apparently seen by Dr. Humphrey Rolls, however, I do not see his note at this time in the computer, but the patient told me that Dr. Humphrey Rolls felt that the patient very likely has also some element of congestive heart failure. For this reason, diuresis was initiated as well as echocardiogram was obtained. Echocardiogram revealed left ventricle grossly normal size, no thrombus of function, left ventricular systolic function was found to be normal, ejection fraction was more than 55%, there was normal left ventricular wall thickness, left ventricular wall motion was normal, and right ventricular systolic function was also found to be normal. However, with diuresis, the patient's condition markedly improved. She lost with diuresis approximately 1.7 liters during her stay in the hospital time and her oxygenation improved.  On the day of discharge, the patient's temperature is 98, pulse 89, respiratory rate 21, blood pressure 117/76, and saturation was 95% on room air at rest. It was felt that the patient is to continue therapy for COPD exacerbation and acute bronchitis; however, she is also to continue advanced doses of Lasix, which was given to her upon discharge. She was already on ACE inhibitor and lisinopril and her blood pressure was well controlled so that medication was not            changed. The patient is to follow up with her primary care physician, Dr. Albertine Patricia, in the next few days after discharge to followup her improvement.  TIME SPENT: 40 minutes.  ____________________________ Theodoro Grist, MD rv:slb D: 02/28/2012 14:54:19 ET T: 03/02/2012  11:16:40 ET JOB#: [id number]  cc: Theodoro Grist, MD, <Dictator> Beat D. Albertine Patricia, MD Theodoro Grist MD ELECTRONICALLY SIGNED 03/10/2012 22:11

## 2014-10-20 NOTE — H&P (Signed)
PATIENT NAME:  Sherry Prince, Sherry Prince MR#:  [id number] DATE OF BIRTH:  05-05-1946  PRIMARY CARE PHYSICIAN:  [facility].  CHIEF COMPLAINT: Trouble breathing.   HISTORY OF PRESENT ILLNESS: This is a 69 year old female with history of COPD, sleep apnea, history of congestive heart failure. She presents with shortness of breath for the past 3 days. She has been coughing up clear phlegm and wheezing. She is having some sweats, but no fever, occasional nausea. The patient received 125 mg of Solu-Medrol by EMS, and chest x-ray was negative for pneumonia. Hospitalist services were contacted for admission for COPD exacerbation.  PAST MEDICAL HISTORY:  COPD, metastatic breast cancer to lung, liver, bone and lymph node; history of congestive heart failure, obesity, sleep apnea, restless leg syndrome, diabetes, hypertension, kidney stones, history of chronic kidney disease in the past.   PAST SURGICAL HISTORY: Hysterectomy, hand surgery, parathyroidectomy, mastectomy in 2005.   ALLERGIES: PENICILLIN.   SOCIAL HISTORY: Quit smoking. No alcohol. No drug use. Lives alone.   FAMILY HISTORY: Father died of colon cancer, had COPD. Mother died of Alzheimer's. Sister died of COPD and heart failure.   MEDICATIONS: Include albuterol 3 mL every 6 hours as needed for shortness of breath, Celexa 40 mg daily, furosemide 40 mg twice a day, hydroxyzine 25 mg every 6 hours as needed for itching, lisinopril 10 mg twice a day, loratadine 10 mg daily, lorazepam 1 mg at bedtime for restless leg syndrome, metformin 1000 mg twice a day, Nitrostat 0.4 mg sublingually every 5 minutes as needed for chest pain, omeprazole 20 mg twice a day, promethazine 25 mg every 6 hours as needed for nausea, vomiting; simvastatin 40 mg at bedtime, Spiriva 1 inhalation daily, Symbicort 160/4.5, 2 puffs twice a day, trazodone 50 mg at bedtime.   PHYSICAL EXAMINATION: VITAL SIGNS: On presentation include temperature 98.3, pulse 108, respirations 22,  blood pressure 135/71, pulse ox 97%, that was on 2 liters.   GENERAL: Currently now, in general, no respiratory distress, sitting up in bed.  EYES: Conjunctivae and lids normal. Pupils equal, round and reactive to light. Extraocular muscles intact. No nystagmus.  EARS, NOSE, MOUTH AND THROAT: Tympanic membranes: Slight bulging. No erythema. Nasal mucosa: No erythema. Throat: No erythema. No exudate seen. Lips and gums: No lesions.  NECK: No JVD. No bruits. No lymphadenopathy. No thyromegaly. No thyroid nodules palpated.  RESPIRATORY: Decreased breath sounds bilaterally. Positive wheeze throughout entire lung field.  CARDIOVASCULAR SYSTEM: S1, S2 normal. No gallops, rubs or murmurs heard. Carotid upstroke 2+ bilaterally. No bruits.  EXTREMITIES: Dorsalis pedis pulses 2+ bilaterally, 2+ bilateral lower extremity edema.  ABDOMEN: Soft, nontender. No organomegaly/splenomegaly. Normoactive bowel sounds. No masses felt.  LYMPHATIC: No lymph nodes in the neck.  MUSCULOSKELETAL: 2+ edema. No clubbing. No cyanosis.  SKIN: No rashes or ulcers seen.  NEUROLOGIC: Cranial nerves II through XII grossly intact. Deep tendon reflexes 2+ bilateral lower extremities.  PSYCHIATRIC: The patient is oriented to person, place and time.   LABORATORY AND RADIOLOGICAL DATA: Chest x-ray shows no acute disease of the chest. Glucose 187, BUN 12, creatinine 0.96, sodium 135, potassium 3.7, chloride 100, CO2 of 30, calcium 9.2. White blood cell count 11.9, H and H 12.2 and 36.8, platelet count of 302. Troponin negative. BNP 512   EKG: Sinus tachycardia, LVH.   ASSESSMENT AND PLAN: 1.  Chronic obstructive pulmonary disease exacerbation. The patient received 125 mg of Solu-Medrol via EMS. We will give Solu-Medrol 60 mg q.6 hours. Spiriva and Symbicort will be  continued. Will give Zithromax 500 mg IV x 1 and 250 mg daily, nebulizer treatments,  continue to monitor clinically.  2.  Patient complains of hearing loss, ear pain, some  headaches. With her history of metastatic breast cancer, we will rule out brain mets with an MRI of the brain.  3.  Obesity and sleep apnea. Continue CPAP at night.  4.  Diabetes. Hold Glucophage at this point, put on sliding scale.  5.  Hypertension. Blood pressure currently stable on current medications.  6.  History of congestive heart failure. Continue oral medications. No signs of congestive heart failure at this time.   TIME SPENT ON ADMISSION: 55 minutes.   The patient is a DO NOT RESUSCITATE.    ____________________________ Tana Conch. Leslye Peer, MD rjw:dmm D: 04/19/2013 19:52:28 ET T: 04/19/2013 20:08:31 ET JOB#: [id number]  cc: Tana Conch. Leslye Peer, MD, <Dictator> [facility] Marisue Brooklyn MD ELECTRONICALLY SIGNED 04/25/2013 11:31

## 2014-10-20 NOTE — Discharge Summary (Signed)
PATIENT NAME:  Sherry, Prince MR#:  [id number] DATE OF BIRTH:  Aug 21, 1945  DATE OF ADMISSION:  04/19/2013 DATE OF DISCHARGE:  04/21/2013  DISCHARGE DIAGNOSES:  1.  Chronic obstructive pulmonary disease exacerbation.  2.  Hearing impairment.  3.  Hypotension.  4.  Diabetes.  5.  Metastases breast cancer.  6.  Diastolic congestive heart failure.  CODE STATUS ON DISCHARGE:  Do not resuscitate.  MEDICATIONS ON DISCHARGE:  1.  Omeprazole 20 mg delayed-release 2 times a day.  2.  Metformin 1000 mg 2 times a day.  3.  Simvastatin 40 mg once a day.  4.  Nitrostat 0.4 mg sublingual tablet every five minutes as needed for chest pain.  5.  Lorazepam 1 mg oral tablet once a day.  6.  Citalopram 40 mg once a day.  7.  Lisinopril 10 mg 2 times a day.  8.  Trazodone 50 mg one capsule at bedtime as needed for sleep.  9.  Promethazine 25 mg take one half to 1 tablet every six hours as needed for nausea.  10.  Loratadine 10 mg once a day for allergies.  11.  Furosemide 40 mg oral tablet 2 times a day.  12.  Spiriva 18 mcg inhalation capsule once a day.  13.  Vitamin D3 1000 international units oral capsule once a day.  14.  Symbicort 2 puffs inhalation 2 times a day. 15.  Prednisone 10 mg tablet, start at 60 mg and taper x 10 mg daily until finished.  16.  Glipizide 5 mg oral tablet.   DIET: Low sodium, low fat, cholesterol and gallbladder-controlled ADA diet.   ACTIVITY: As tolerated.   TIMEFRAME TO FOLLOW: Within 1 to 2 weeks follow-up with PMD,   HISTORY OF PRESENTING ILLNESS: A 69 year old female with history of chronic obstructive pulmonary disease and sleep apnea, presented with severe wheezing. She received 125 mg of Solu-Medrol by EMS, admitted for chronic obstructive pulmonary disease exacerbation to hospitalist service.  HOSPITAL COURSE AND STAY: After having the treatment for chronic obstructive pulmonary disease with IV steroids antibiotic, patient felt significantly better after two  week, and so we discharged her, continuing this medication orally at home   [place]:   1.  Patient had complaint of ear pain and some hearing loss due to history of breast cancer. MRI of the brain was done, which did not show any metastasis.  2.  Diabetes, uncontrolled diabetes, was due to IV steroids.  We switched to oral and restarted on oral home medication, what she was taking before. Advised her to continue with her primary care physician. 3.  History of congestive heart failure. There were no symptoms of congestive heart failure in the hospital. We continued oral medication.   IMPORTANT LABORATORY RESULTS:  BUN was 12, creatinine 0.96 on admission. WBC was 11.9, hemoglobin was 12.2. Troponin less than 0.02. Chest x-ray, portable: No acute disease of the chest. Creatinine went up to 1.2. MRI of the brain without contrast did not show any abnormality. Blood sugar was ranging a little higher, between 400 and 500.   TOTAL TIME SPENT ON THIS DISCHARGE: 45 minutes.    ____________________________ Ceasar Lund Anselm Jungling, MD vgv:cg D: 04/25/2013 00:17:16 ET T: 04/25/2013 05:17:58 ET JOB#: [id number]  cc: Ceasar Lund. Anselm Jungling, MD, <Dictator> Vaughan Basta MD ELECTRONICALLY SIGNED 04/27/2013 14:01

## 2014-10-20 NOTE — H&P (Signed)
PATIENT NAME:  Sherry Prince, Sherry Prince MR#:  [id number] DATE OF BIRTH:  1946/06/16  DATE OF ADMISSION:  05/23/2013  PRIMARY CARE PROVIDER: [facility].   EMERGENCY DEPARTMENT REFERRING PHYSICIAN: Dr. Ponciano Ort.   CHIEF COMPLAINT: Shortness of breath, acute respiratory failure.   HISTORY OF PRESENT ILLNESS: The patient is a 69 year old morbidly obese female with history of COPD, only uses oxygen with her CPAP at nighttime. Has history of metastatic breast cancer to lung, liver, bone, lymph node, who was recently hospitalized here 3 weeks prior, on 04/19/2013, for 3 days of COPD exacerbation, was treated with azithromycin and prednisone nebulizers with significant improvement. She was seen on 11/14 at the clinic where her breathing was back to baseline, and then 1 week later she started to feel short of breath again. She was treated with prednisone and azithromycin, which she just finished a course. She went back for a followup and continued to have shortness of breath and cough. She feels like she has cold sweats, and has been feeling very tired. The patient came to the ED and was noted to be tachypneic, had to be placed on BiPAP. She is currently on BiPAP. She otherwise denies any chest pains or palpitations. Denies any abdominal pain, nausea, vomiting or diarrhea.   PAST MEDICAL HISTORY: Significant for chronic obstructive pulmonary disease, metastatic breast cancer to lung, liver, bone and lymph nodes; history of congestive heart failure, morbid obesity, sleep apnea, restless leg syndrome, diabetes, hypertension, kidney stones, history of chronic kidney disease in the past. Has osteoarthritis, history of suicidal ideation in the past, peripheral neuropathy, history of hearing loss, type 2 diabetes, hyperlipidemia, depression, GERD, allergic rhinitis.   ALLERGIES: PENICILLIN AND LISINOPRIL AND PEANUTS.   CURRENT MEDICATIONS: As an outpatient, albuterol 3 mL q.4 to q.6 p.r.n. for shortness of  breath, atorvastatin 40 at bedtime, calcium plus vitamin D 1 tab p.o. daily, citalopram 40 daily, Flonase 1 spray to nostril daily, Lasix 40, 1 tab p.o. daily; gabapentin 300, 1 tab p.o. daily; glipizide XL 50 mg daily, ipratropium 4 times a day as needed, isosorbide mononitrate 30 daily, loratadine 10 daily, lorazepam 1 mg at bedtime, omeprazole 20, 1 tab p.o. b.i.d., Proventil 2 puffs q.4 p.r.n., Spiriva 18 mcg daily, Symbicort 2 puffs b.i.d., Xopenex 3 mL q.4 p.r.n., Zofran 4 mg t.i.d. as needed.   SOCIAL HISTORY: Does not smoke. Used to smoke, quit smoking. No alcohol or drug use.   FAMILY HISTORY: Father died of colon cancer. Had COPD.  Mother died of Alzheimer's. Sister died of COPD and heart failure.     REVIEW OF SYSTEMS:   CONSTITUTIONAL: Complains of feeling cold, but no fevers. No weight loss. No weight gain.  EYES: No blurred or double vision. No redness. No erythema. No glaucoma or cataracts. Denies any epistaxis, nasal congestion. No seasonal or year-round allergies.  EARS, NOSE, THROAT: Does complain of hearing impairment. No difficulty swallowing.  CARDIOVASCULAR: Denies any chest pain. Complains of shortness of breath. No edema. No syncope. No palpitations.  RESPIRATORY: Complains of productive greenish cough, shortness of breath, wheezing. No hemoptysis.  GASTROINTESTINAL: Denies any abdominal pain, nausea, vomiting or diarrhea. No hematemesis. No hematochezia.  GENITOURINARY: Denies any frequency, urgency or hesitancy.  MUSCULOSKELETAL: Denies any joint pain or swelling.  SKIN: No skin rash. No ulcers.  NEUROLOGIC:  No focal weakness. No seizures. No headaches.  PSYCHIATRIC: Has a history of anxiety and depression.  ENDOCRINE: No polyuria or polydipsia. No heat or cold intolerance.   PHYSICAL  EXAMINATION: VITAL SIGNS: Temperature 98, pulse 130, respirations 24, blood pressure 141/75, currently on BiPAP.  GENERAL: The patient is an obese female in acute respiratory distress,  tachypneic.  HEAD, EYES, EARS, NOSE, THROAT: Head atraumatic, normocephalic. Pupils equally round, reactive to light and accommodation. There is no conjunctival pallor. No scleral icterus. Nasal exam shows no drainage or ulceration.  Oropharynx is clear without any exudate.  NECK: Supple without any JVD. No carotid bruits.  CARDIOVASCULAR: Regular rate and rhythm, tachycardic. No murmurs, gallops.  LUNGS: Diminished breath sounds bilaterally without any rales or rhonchi.  ABDOMEN: Soft, nontender, nondistended. Positive bowel sounds x 4.  EXTREMITIES: No clubbing, cyanosis or edema.  SKIN: No rash.  LYMPHATICS: No lymph nodes palpable.  VASCULAR: Good DP, PT pulses.  PSYCHIATRIC: Not anxious or depressed.  NEUROLOGIC: Awake, alert, oriented x 3. No focal deficits.   RADIOLOGIC AND LABORATORY DATA: Chest x-ray shows no acute abnormality. WBC 18.8, hemoglobin 12.2, platelet 355. Troponin less than 0.02. Glucose 177, BUN 23, creatinine 1.07, sodium 135, potassium 3.3, chloride 98. CO2 is 28. LFTs showed an alk phos of 127, albumin of 3.   ASSESSMENT AND PLAN: The patient is a 69 year old who is coming back to the hospital with shortness of breath.  1.  Acute on chronic respiratory failure due to acute on chronic obstructive pulmonary disease exacerbation. We will treat her with IV Levaquin, IV Solu-Medrol nebulizers. We will continue BiPAP for now. We will consult her pulmonologist, Dr. Humphrey Rolls. Monitor her respiratory, wean BiPAP as tolerated.  2.  Obstructive sleep apnea. The patient will be on BiPAP tonight, and then CPAP as tolerated.  3.  Diabetes.  We will place her on sliding scale insulin. Follow blood glucose. Continue sulfonylurea.  4.  Hypertension. The patient is on Imdur, which we will continue.  5.  Gastroesophageal reflux disease. We will continue omeprazole.  6.  Metastatic breast cancer, prognosis poor. I will ask the palliative care team to come evaluate the patient.  7.  History of  congestive heart failure, currently not on any treatment. We will monitor her fluid status, use Lasix if needed.  We will continue furosemide as taking at home. 8.  Miscellaneous: We will place her on Lovenox for deep vein thrombosis prophylaxis.   NOTE:  50 minutes of critical care time spent.   The patient at high risk of cardiopulmonary arrest.       ____________________________ Lafonda Mosses. Posey Pronto, MD shp:dmm D: 05/23/2013 20:10:23 ET T: 05/23/2013 20:31:58 ET JOB#: [id number]  cc: Daimien Patmon H. Posey Pronto, MD, <Dictator> Alric Seton MD ELECTRONICALLY SIGNED 06/06/2013 8:29

## 2014-10-20 NOTE — Discharge Summary (Signed)
PATIENT NAME:  Sherry Prince, Sherry Prince MR#:  [id number] DATE OF BIRTH:  1946/03/18  DATE OF ADMISSION:  05/23/2013 DATE OF DISCHARGE:  05/30/2013  ADMITTING DIAGNOSIS: Chronic obstructive pulmonary disease exacerbation.   DISCHARGE DIAGNOSES: 1.  Acute on chronic respiratory failure.  2.  Chronic obstructive pulmonary disease exacerbation.  3.  Acute bronchitis, completed Levaquin course.  4.  Diabetes mellitus with hemoglobin A1c 10.1, now insulin-dependent.  5.  Hypertension.  6.  Obstructive sleep apnea on CPAP at home.  Diastolic congestive heart failure, chronic.  7.  Stage IV breast carcinoma with mets.  8.  Generalized weakness.   DISCHARGE CONDITION: Stable.   DISCHARGE MEDICATIONS:  1.  The patient is to resume omeprazole 20 mg p.o. twice daily,  *   Albuterol 3 mL every 4 to 6 hours as needed.  2.  Lorazepam 1 mg p.o. at bedtime.  3.  Citalopram 40 mg p.o. daily.  4.  Loratadine 10 mg p.o. as needed.   5.  Symbicort 160/4.5, two puffs twice daily.  6.  Spiriva 1 inhalation once daily.  7.  Glipizide XL 15 mg p.o. daily.  8.  Atorvastatin 40 mg p.o. at bedtime.  9.  Furosemide 40 mg p.o. daily.  10.  Calcium with vitamin D 1 tablet once daily.  11.  Zofran 4 mg 3 times daily as needed.  12.  Flonase one spray nasally once daily.  13.  Gabapentin 300 mg p.o. daily.  14.  Ipratropium 1 vile every four hours as needed.  15.  Xopenex 1.25 mg 3 mL every four hours as needed.  16.  Proventil HFA 2 puffs every four hours as needed.  17.  Isosorbide mononitrate 30 mg p.o. daily.  18.  Trazodone 50 mg p.o. at bedtime as needed.  19.  Prednisone 20 mg p.o. on 05/31/2013, then taper by 10 mg daily until stopped.  20.  Insulin detemir 28 units subcutaneously at bedtime.  21.  Metoprolol tartrate 25 mg p.o. twice daily.   The patient will be discharged home with home health physical therapy as well as nurse as well as [employer], [employer].   HOME OXYGEN: None.   DIET: 2 grams salt, low fat,  low cholesterol, carbohydrate -controlled diet, regular consistency.  ACTIVITY LIMITATIONS: As tolerated.   REFERRAL:  To physical therapy R.N. as well as social work.    FOLLOWUP APPOINTMENT: With Dr. Christoper Fabian  in 2 to 3 days after discharge.   CONSULTANTS: Care management, social work, Dr. Mortimer Fries pulmonary care, also palliative care.   RADIOLOGIC STUDIES: Chest x-ray, portable single view, on 05/23/2013, revealed no acute findings.   HOSPITAL COURSE:  The patient is a 69 year old Caucasian female with past medical history significant for history of chronic obstructive pulmonary disease, also breast cancer, who presents to the hospital with complaints of shortness of breath, acute respiratory failure. Please refer to Dr. Chana Bode Patel's admission note on the 05/23/2013. On arrival to the hospital, the patient's physical exam showed diminished breath sounds bilaterally with no rales, rhonchi, she was in acute respiratory distress and she was very tachypneic. Her temperature was 98, pulse was 130, respiration rate was 24, blood pressure 141/75, saturation was measured on BiPAP at 100% on noninvasive ventilation. The patient was admitted to Intensive Care Unit for BiPAP use. Her chest x-ray did not show any changes. She was initiated on broad-spectrum antibiotic as well as steroids IV as well as inhalation therapy. Consultation with Dr. Mortimer Fries pulmonary was obtained. The patient  was managed conservatively her condition improved. She was weaned off BiPAP and she was transferred to the floor. Since the patient's family was concerned about her well-being and wanted her to go to assisted living facility, we were trying to get patient  in an assistive living facility; however, finally on 05/30/2013,we were not able to get her to facility of her wishes, we are going to discharge the patient to home with home health. Physical therapist consulted the patient is recommended home health. She is to continue steroid  taper. She already completed her Levaquin course. She is to continue inhalation therapy at home for her chronic obstructive pulmonary disease exacerbation. We tried to obtain her sputum cultures, however, we were not able. However, since the patient improved on Levaquin antibiotic, we felt that no further changes should be made. In regards to diabetes mellitus, we checked a hemoglobin A1c and this was found to be 10.1. The patient was initiated on long-acting insulin. She is to continue long-acting insulin at 28 units subcutaneously daily. She is to follow up with her primary care physician for further recommendations. For history of hypertension, obstructive sleep apnea, chronic congestive heart failure, the patient is to continue her outpatient management. For a history of stage IV breast carcinoma with metastasis, she is to continue follow up with oncology as previously scheduled. For generalized weakness, she will be receiving physical therapy at home. She is being discharged in stable condition with the above-mentioned medications and follow-up. Her vital signs on the day of discharge: Temperature was 98.3, pulse was 88, respiratory rate was 18 to 20, blood pressure ranging from 98 to 163 systolic and 84T to 36I diastolic. Oxygen saturation was 95% on room air at rest.   TIME SPENT: 40 minutes on this patient.   ____________________________ Theodoro Grist, MD rv:cc D: 05/30/2013 15:52:32 ET T: 05/30/2013 22:36:42 ET JOB#: [id number]  cc: Theodoro Grist, MD, <Dictator> Christoper Fabian, MD Pettit MD ELECTRONICALLY SIGNED 06/08/2013 16:04

## 2014-10-21 NOTE — Discharge Summary (Signed)
PATIENT NAME:  Sherry Prince, Sherry Prince MR#:  [id number] DATE OF BIRTH:  April 17, 1946  DATE OF ADMISSION:  03/24/2014 DATE OF DISCHARGE:  03/28/2014  DISCHARGE DIAGNOSIS: Acute on chronic respiratory failure due to chronic obstructive pulmonary disease exacerbation.   SECONDARY DIAGNOSES:  1. Chronic obstructive pulmonary disease.  2. Metastatic breast cancer.  3. Morbid obesity. 4. Congestive heart failure.  5. Sleep apnea.  6. Restless leg syndrome.  7. Diabetes mellitus.  8. Hypertension.  9. Kidney stone.  10. Chronic kidney disease.  11. Osteoarthritis.  12. Peripheral neuropathy. 13. History of suicidal ideation.  14. History of hearing loss.  15. Hyperlipidemia.  16. Depression. 17. Gastroesophageal reflux disease.   CONSULTATION: None.   PROCEDURE AND RADIOLOGY: Chest x-ray on September 25 showed no acute cardiopulmonary disease.   CT scan of the chest on September 25 showed no pulmonary embolism. Possible bronchitis.   MAJOR LABORATORY PANEL: Urinalysis on admission was negative.   HISTORY AND SHORT HOSPITAL COURSE: The patient is a 69 year old female with the above-mentioned medical problems who was admitted for shortness of breath thought to be secondary to COPD exacerbation. Please see Dr. Graciela Husbands dictated history and physical for further details. The patient was found to be in acute on chronic respiratory failure secondary to COPD exacerbation. The patient was slow to improve, was continued on nebulizer breathing treatment along with steroids and was close to her baseline by September 29 and was discharged home in stable condition.  On the date of discharge, her vital signs were as follows: Temperature 97.6, heart rate 78 per minute, respiration 18 per minute, blood pressure 146/83. She was saturating 96% on room air.   PERTINENT PHYSICAL EXAMINATION ON THE DATE OF DISCHARGE:  CARDIOVASCULAR: S1, S2 normal. No murmur, rubs or gallop.  LUNGS: Clear to auscultation bilaterally.  No wheezing, rales, rhonchi or crepitation.  ABDOMEN: Soft, benign.  NEUROLOGIC: Nonfocal examination.  All other physical examination remained at baseline.   DISCHARGE MEDICATIONS:  1. Omeprazole 20 mg p.o. b.i.d.  2. Albuterol inhalation every 4 to 6 hours as needed.  3. Citalopram 40 mg p.o. daily.  4. Loratadine 10 mg p.o. daily.  5. Symbicort 2 puffs inhaled twice a day.  6. Spiriva once daily.  7. Atorvastatin 40 mg p.o. at bedtime. 8. Calcium with vitamin D once daily.  9. Flonase once daily.  10. Gabapentin 300 mg p.o. daily.  11. Proventil 2 puffs inhaled every 4 hours as needed.  12. Metoprolol 25 mg p.o. b.i.d.  13. Lasix 60 mg p.o. daily.  14. Isosorbide mononitrate 30 mg p.o. daily.  15. Metformin 1000 mg p.o. b.i.d.  16. Insulin Levemir 45 units subcutaneous at bedtime.  17. Prednisone 60 mg p.o. daily, taper 10 mg daily until finished.   DISCHARGE DIET: Low sodium, 1800 ADA.   DISCHARGE ACTIVITY: As tolerated.   DISCHARGE INSTRUCTIONS AND FOLLOW-UP: The patient was instructed to follow up with her primary care physician, Dr. Elyse Jarvis at [facility] in 1 to 2 weeks.   TOTAL TIME DISCHARGING THIS PATIENT: 45 minutes.    ____________________________ Lucina Mellow. Manuella Ghazi, MD vss:JT D: 03/30/2014 23:13:44 ET T: 03/31/2014 01:28:37 ET JOB#: [id number]  cc: Rieley Hausman S. Manuella Ghazi, MD, <Dictator> Elyse Jarvis, MD    Lucina Mellow [facility] MD ELECTRONICALLY SIGNED 04/04/2014 15:48

## 2014-10-21 NOTE — H&P (Signed)
PATIENT NAME:  Sherry Prince, Sherry Prince MR#:  [id number] DATE OF BIRTH:  Jan 10, 1946  DATE OF ADMISSION:  03/24/2014  REFERRING PHYSICIAN: Dr. Marjean Donna.   PRIMARY CARE PHYSICIAN: [facility].    ONCOLOGY:  [facility].    CHIEF COMPLAINT: Shortness of breath.   HISTORY OF PRESENT ILLNESS: This is a 69 year old morbidly obese female with known history of COPD, on home oxygen with CPAP at nighttime, history of metastatic breast cancer to the lungs, liver, bone and lymph nodes, who presents with complaints of shortness of breath. The patient was noticed to be in significant wheezing upon presentation, requiring oxygen and received Solu-Medrol and multiple nebulizer treatments. Despite that, she remained to be significantly short of breath and wheezy, so hospitalist is requested to admit the patient. She denies any fever, any chest pain, any hemoptysis. Reports mild cough, but no productive sputum. Given the patient's cancer, history she had CT chest angiogram to rule out PE, it was negative for PE, which did show evidence of bronchitis which could be chronic or infectious as well. Hospitalist requested to admit the patient for further treatment.    PAST MEDICAL HISTORY:  1. COPD.  2. Metastatic breast cancer to lung, liver, lymph nodes and bone.  3. History of CHF.  4. Morbid obesity.  5. Sleep apnea on CPAP.  6. Restless leg syndrome.  7. Diabetes on insulin.  8. Hypertension.  9. Kidney stones.  10. Chronic kidney disease.  11. Osteoarthritis.  12. Peripheral neuropathy.  13. History of suicidal ideation in the past.  14. History of hearing loss.  15. Hyperlipidemia.  16. Depression.   17. GERD.   ALLERGIES: PENICILLIN, LISINOPRIL AND PEANUTS.   SOCIAL HISTORY: Does not smoke. Used to smoke, quit smoking. No alcohol. No illicit drug use.   FAMILY HISTORY: Significant for colon cancer, COPD and Alzheimer's.   HOME MEDICATIONS:  1. Imdur 30 mg oral daily. 2. Gabapentin 300 mg oral  daily. 3. Celexa 40 mg oral daily. 4. Levemir 35 units at bedtime. 5. Metformin 1 gram oral 2 times a day. 6. Loratadine 10 mg oral daily.  7. Proventil as needed.  8. Spiriva 18 mcg inhalational daily.  9. Symbicort 160/4.5 inhalational 2 puffs b.i.d.  10. Lasix 60 mg oral daily.  11. Flonase one spray daily.  12. Omeprazole 20 mg oral 2 times a day.  13. Calcium 600 with vitamin D oral daily.   REVIEW OF SYSTEMS:  CONSTITUTIONAL: Denies fever, chills, fatigue, weakness, weight gain, weight loss.  EYES: Denies blurry vision, double vision, inflammation.  EARS, NOSE AND THROAT:  Denies tinnitus, ear pain, hearing loss, epistaxis.  RESPIRATORY: Reports shortness of breath, wheezing, cough, nonproductive sputum, history of COPD.   CARDIOVASCULAR: Denies chest pain, edema, palpitation, syncope.  GASTROINTESTINAL: Denies nausea, vomiting, diarrhea, abdominal pain.  GENITOURINARY: Denies dysuria, hematuria, renal colic.  ENDOCRINE: Denies polyuria, polydipsia, heat or cold intolerance.  HEMATOLOGY: Denies anemia, easy bruising, bleeding diathesis.  INTEGUMENTARY: Denies acne, rash, or skin lesion.  MUSCULOSKELETAL: Reports history of arthritis. Denies cramps or gout or lower back pain.  NEUROLOGIC: Denies any history of CVA, TIA, focal deficits, tingling, numbness.  PSYCHIATRIC: Denies history of anxiety, insomnia, or depression. Denies any suicidal ideation or thoughts.   PHYSICAL EXAMINATION:  VITAL SIGNS: Temperature 98.4, pulse 91, respiratory rate 18, blood pressure 113/61, saturating 99% on oxygen.  GENERAL: Obese female, looks comfortable in bed, in no apparent distress.  HEENT: Head is atraumatic, normocephalic. Pupils equal, reactive to light. Pink  conjunctivae. Anicteric sclerae. Moist oral mucosa. No oral thrush or pharyngeal erythema.  NECK: Supple. No thyromegaly. No JVD. No carotid bruits. Trachea is midline. No nuchal rigidity.  CHEST: Fair air entry bilaterally with  diffuse wheezing with long expiration. Negative use of accessory muscle.  CARDIOVASCULAR: S1, S2 heard. No rubs, murmur or gallops. PMI nondisplaced.  ABDOMEN: Obese, soft, nontender, nondistended. Bowel sounds present. No hepatosplenomegaly.  EXTREMITIES:  Lower extremities no edema. No clubbing. No cyanosis. Pedal pulses felt bilaterally.  PSYCHIATRIC: Appropriate affect. Awake, alert x 3. NEUROLOGIC: Cranial nerves grossly intact. Motor 5/5. No focal deficits. Sensation symmetrical to light touch.  MUSCULOSKELETAL: No joint effusion or erythema.  SKIN: Normal skin turgor. Warm and dry.   PERTINENT LABORATORY DATA: Glucose 163, BUN 10, creatinine 0.97, sodium 141, potassium 3.7, ALT 19, AST 17, alkaline phosphatase 90, troponin less than 0.02. White blood cells 11.5, hemoglobin 10.8, hematocrit 33.9, platelets 264,000. D-dimer 468. CT chest with IV contrast. No evidence of PE and showing evidence of bronchitis.   ASSESSMENT AND PLAN:  1. Chronic obstructive pulmonary disease exacerbation. The patient will be started on large dose IV steroids 125 mg every 6 hours, she complains of some cough so we will start her on azithromycin as well. We will continue her on home medication including Symbicort and Spiriva and nebulizer treatment as well.  2. History of obstructive sleep apnea. Continue the patient on CPAP at bedtime.  3. Diabetes mellitus. We will continue the patient on Levemir at a lower dose and add insulin sliding scale as well, we will hold her metformin given the fact she received IV contrast.  4. Bronchitis. We will start the patient on azithromycin.  5. Hypertension. Blood pressure acceptable. Continue with Imdur.  6. History of congestive heart failure. Appears to be compensated at this point. Will hold on starting IV fluids, but as well we will hold her Lasix. We will resume once she is more stable.  7. History of breast cancer stage IV as per patient, she is following at [facility].  8.  Deep vein thrombosis prophylaxis. Subcutaneous heparin.   CODE STATUS: The patient reports she is DO NOT RESUSCITATE. She had the form at home which EMS did not bring with her.   TOTAL TIME SPENT ON ADMISSION AND PATIENT CARE: 55 minutes.    ____________________________ Albertine Patricia, MD dse:JT D: 03/24/2014 05:29:23 ET T: 03/24/2014 06:10:12 ET JOB#: [id number]  cc: Albertine Patricia, MD, <Dictator> Obadiah Dennard Graciela Husbands MD ELECTRONICALLY SIGNED 04/08/2014 23:04

## 2014-10-22 NOTE — H&P (Signed)
PATIENT NAME:  Sherry Prince, Sherry Prince MR#:  [id number] DATE OF BIRTH:  08/13/1945  DATE OF ADMISSION:  11/22/2011  PRIMARY CARE PHYSICIAN: Dr. Deirdre Evener   ONCOLOGIST AND CARDIOLOGIST: At [place]: Chest pain.   HISTORY OF PRESENT ILLNESS: [name] is a 69 year old Caucasian female with history of breast cancer with metastasis to the lungs, liver, bones, and lymph nodes. She was on chemotherapy but that was stopped because of coronary artery disease and congestive cardiomyopathy. The patient stated that at 12:30 a.m. today she developed midsternal chest pain radiating to the left shoulder and the left side of the chest. The pain was described as sharp pain and then became dull with occasional sharp pain that shoots. The severity of the pain was 10 on a scale of 10. The pain gradually eased off to reach a level of 5 on a scale of 10. There is associated shortness of breath with the chest pain. There was a feeling of nausea but no vomiting. The patient was evaluated here in the Emergency Department. Her EKG is unremarkable and the first set of troponin is normal. The patient was admitted for further evaluation and management.   REVIEW OF SYSTEMS: CONSTITUTIONAL: Denies any fever. No chills. No fatigue. EYES: No blurring of vision. No double vision. ENT: She has moderate hearing impairment. No sore throat. No dysphagia. CARDIOVASCULAR: Reports chest pain and shortness of breath. No edema. No syncope. RESPIRATORY: No cough. No sputum production. No hemoptysis but admits having chest pain. GASTROINTESTINAL: No abdominal pain. No vomiting but has nausea. Denies diarrhea. GENITOURINARY: No dysuria. No frequency of urination. No vaginal bleed. MUSCULOSKELETAL: No joint pain or swelling. No muscular pain or swelling. INTEGUMENTARY: No skin rash. No ulcers. NEUROLOGY: No focal weakness. No seizure activity. No headache. PSYCHIATRY: She has history of anxiety and depression. ENDOCRINE: No polyuria.  No polydipsia. No heat or cold intolerance.   PAST MEDICAL HISTORY:  1. History of breast cancer status post right mastectomy. She reports she has metastasis, that is stage IV metastatic breast cancer. 2. History of chronic obstructive pulmonary disease. 3. History of obstructive sleep apnea on CPAP treatment. 4. Hypertension.  5. Hyperlipidemia.  6. Non-insulin-dependent diabetes mellitus, type II.  7. History of coronary disease.  8. History of congestive heart failure, diastolic dysfunction with ejection fraction greater than 55% by echocardiogram in December 2011.  9. History of gastroesophageal reflux disease.  10. History of iron deficiency anemia.    PAST SURGICAL HISTORY:  1. History of right mastectomy. 2. Partial hysterectomy.  3. The patient is unsure if she has had appendectomy or not.  4. History of Port-A-Cath insertion. 5. 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   SOCIAL HISTORY: She is widowed. Lives at home alone.   SOCIAL HABITS: Ex chronic smoker. She used to smoke 2 packs a day since the age of 66. She quit 10 years ago. The patient is nonalcoholic and denies any drug abuse.   FAMILY HISTORY: Her mother died at age of 4. She was healthy. Her father died after having colon cancer.   ADMISSION MEDICATIONS:  1. Spiriva one inhalation once a day. 2. Simvastatin 40 mg a day.  3. Potassium chloride 8 mEq once a day.  4. Omeprazole 20 mg a day. 5. Metformin 1000 mg twice a day. 6. Glipizide XL 10 mg a day. 7. Lasix 40 mg a day. 8. Celexa 20 mg a day. 9. Albuterol nebulization p.r.n.  10. Ativan 1 mg  p.r.n. for anxiety.   ALLERGIES: Penicillin causes skin rash.    PHYSICAL EXAMINATION:   VITAL SIGNS: Blood pressure 106/82, respiratory rate 18, pulse 95, temperature 98.1, oxygen saturation 98%.   GENERAL APPEARANCE: Elderly female laying in bed in no acute distress.   HEAD: Mild pallor. No icterus. No cyanosis.   EARS, NOSE, AND  THROAT: Hearing was normal. Nasal mucosa, lips, tongue were normal. She is edentulous and she has dentures.   EYES: Normal iris and conjunctivae. Pupils about 5 mm, very sluggishly reactive to light.   NECK: Supple. Trachea at midline. No thyromegaly. No cervical lymphadenopathy. No masses.   HEART: Normal S1, S2. Distant heart sounds, barely audible. No murmur. No gallop. No S3 or S4. No carotid bruits.   RESPIRATORY: Normal breathing pattern without use of accessory muscles. No rales. There are scattered  wheezes. These are expiratory.   ABDOMEN: Morbidly obese, soft without tenderness. No hepatosplenomegaly. No masses. No hernia.   SKIN: No ulcers. No subcutaneous nodules.   MUSCULOSKELETAL: No joint swelling. No clubbing.   NEUROLOGIC: Cranial nerves II through XII are intact. No focal motor deficit.   PSYCHIATRY: The patient is alert and oriented x3. Mood and affect were normal.   LABORATORY, DIAGNOSTIC, AND RADIOLOGICAL DATA: EKG showed normal sinus rhythm at rate of 100 per minute, unremarkable EKG.  Chest x-ray heart size was normal. No consolidation. No effusion.   Serum glucose 303, BUN 15, creatinine 1.02, sodium 137, potassium 4. Liver function tests were normal except for slightly low albumin at 3.1. Total CPK 53. Troponin less than 0.02. CBC showed white count 13,000, hemoglobin 11, hematocrit 38, platelet count 276. Prothrombin time 11. INR 0.8. APTT 27.   ASSESSMENT:  1. Midsternal chest pain radiating to the left chest and shoulder suspicious for angina.  2. The patient reports recent history of coronary disease and diastolic congestive heart failure.  3. Stage IV metastatic breast cancer. 4. Chronic obstructive pulmonary disease with mild acute exacerbation.  5. Diabetes mellitus, type 2, uncontrolled.  6. Obstructive sleep apnea, on CPAP treatment and 2 liters of oxygen at night.  7. The patient has history of right mastectomy, partial hysterectomy, possible  appendectomy, parathyroidectomy, and Port-A-Cath insertion.   PLAN:  1. Admit to the medical floor on telemetry. 2. Follow-up cardiac enzymes.  3. I will start aspirin and small dose of beta-blocker.  4. Consult Cardiology.  5. Will treat her COPD mild exacerbation with DuoNebs p.r.n.  6. Continue Spiriva once a day.  7. I will avoid steroids in this setting with sugar being uncontrolled.  8. Place the patient on insulin sliding scale and Accu-Cheks while I will continue the glipizide and the metformin.   9. Will use CPAP at night with oxygen.   I spoke to the patient regarding LIVING WILL. She confirms that she has a LIVING WILL. She gave also the power-of-attorney to her daughter-in-law. Her name is Cathie Beams.   TIME SPENT IN EVALUATING THIS PATIENT: More than 55 minutes.   ____________________________ Clovis Pu. Lenore Manner, MD amd:drc D: 11/22/2011 05:10:52 ET T: 11/22/2011 09:33:17 ET JOB#: [id number]  cc: Clovis Pu. Lenore Manner, MD, <Dictator> Beat D. Albertine Patricia, MD Clovis Pu [place] MD ELECTRONICALLY SIGNED 11/23/2011 6:30

## 2014-10-22 NOTE — Discharge Summary (Signed)
PATIENT NAME:  Sherry Prince, Sherry Prince MR#:  [id number] DATE OF BIRTH:  02/14/1946  DATE OF ADMISSION:  11/22/2011 DATE OF DISCHARGE:  11/23/2011  DISCHARGE DIAGNOSES:  1. Chest pain secondary to mild chronic obstructive pulmonary disease exacerbation and also stress.  2. Hypertension.  3. Diabetes mellitus.  4. Metastatic breast cancer.  5. Sleep apnea.  6. History of depression.  7. History of chronic systolic heart failure.  8. Diabetes mellitus type 2.  9. The patient did have an echo done in December, outside at [facility], and at that time she said she had an ejection fraction around 40%; that has decreased from 50 after she started on chemotherapy, so chemotherapy for breast cancer has been stopped.   CONSULTANTS: Lujean Amel, MD - Cardiology.  HOSPITAL COURSE: This is a 69 year old female with history of hypertension, diabetes, metastatic breast cancer, history of chronic obstructive pulmonary disease, sleep apnea, diabetes, hyperlipidemia, systolic heart failure, and gastroesophageal reflux disease who came in because of chest pain. She said she had chest pain, midsternal and left side, 10/10 in severity, and the patient also felt some nausea. EKG was unremarkable. Troponins have been negative. She was placed on observation and started on aspirin, beta blockers, and nitroglycerin paste along with other medications. The patient says she does not have anymore chest pain today. She was seen by Dr. Clayborn Bigness and the patient's condition was discussed with him. He requested records from [facility] but I did not see any records so the same was explained to him and he said the patient can be discharged, especially if the troponin is negative and she stays pain free, and followup with her cardiologist at [facility] and have a stress test done, like a Lexiscan stress test. The patient has slight wheezing. She is continued on DuoNebs and Spiriva. The patient did not require any steroids during the hospital stay.   ADDITIONAL  LABS/STUDIES: Troponin is less than 0.02. CK total is 53 and CPK-MB 0.5. WBC is slightly elevated at 13.9, hemoglobin is 11.9, hematocrit 38.8, and platelets 276.   Electrolytes: Sodium 137, potassium 4, chloride 97, bicarbonate 29, BUN 15, creatinine 1.02, and glucose 303.  D-dimer 0.27. BNP slightly high at 243.   Chest x-ray showed no acute cardiopulmonary abnormality.   DISCHARGE MEDICATIONS:  1. Spiriva 18 mcg inhalation daily.  2. Albuterol nebulizer as needed. 3. Omeprazole 20 mg twice a day. 4. Glipizide XL 10 mg daily. 5. Azithromycin 500 mg three times a week on Monday, Wednesday, and Friday. She is on chronic azithromycin. 6. Metformin 1 gram twice a day. 7. Simvastatin 40 mg daily. 8. Lasix 40 mg daily. 9. KCL 80 milliequivalents p.o. daily.  10. Ativan 1 mg as needed.  11. Celexa 20 mg daily.   NEW MEDICATION: Nitroglycerin sublingual 0.4 mg as needed for chest pain until she sees her doctor and that can be continued and followup with her cardiologist.  DISCHARGE VITALS: Temperature 97.5, pulse 80, respirations 18, blood pressure 132/80, and saturation 98% on 3 liters. She has a CPAP at home that she can continue and oxygen that she can continue 3 liters.  DISPOSITION: The patient is discharged home.  TIME SPENT ON DISCHARGE PREPARATION: More than 30 minutes.  ____________________________ Epifanio Lesches, MD sk:slb D: 11/23/2011 10:07:55 ET     T: 11/24/2011 13:40:02 ET        JOB#: [id number] cc: Epifanio Lesches, MD, <Dictator> Epifanio Lesches MD ELECTRONICALLY SIGNED 12/02/2011 7:27

## 2014-12-18 ENCOUNTER — Encounter: Payer: Self-pay | Admitting: Emergency Medicine

## 2014-12-18 ENCOUNTER — Emergency Department
Admission: EM | Admit: 2014-12-18 | Discharge: 2014-12-18 | Disposition: A | Discharge status: Home / Self Care | Payer: Medicare Other | Attending: Emergency Medicine | Admitting: Emergency Medicine

## 2014-12-18 ENCOUNTER — Other Ambulatory Visit: Payer: Self-pay

## 2014-12-18 ENCOUNTER — Emergency Department: Payer: Medicare Other

## 2014-12-18 DIAGNOSIS — I10 Essential (primary) hypertension: Secondary | ICD-10-CM | POA: Insufficient documentation

## 2014-12-18 DIAGNOSIS — J441 Chronic obstructive pulmonary disease with (acute) exacerbation: Secondary | ICD-10-CM | POA: Diagnosis not present

## 2014-12-18 DIAGNOSIS — Z79899 Other long term (current) drug therapy: Secondary | ICD-10-CM | POA: Diagnosis not present

## 2014-12-18 DIAGNOSIS — E119 Type 2 diabetes mellitus without complications: Secondary | ICD-10-CM | POA: Diagnosis not present

## 2014-12-18 DIAGNOSIS — R0602 Shortness of breath: Secondary | ICD-10-CM

## 2014-12-18 DIAGNOSIS — Z880 Allergy status to penicillin: Secondary | ICD-10-CM | POA: Insufficient documentation

## 2014-12-18 DIAGNOSIS — Z7951 Long term (current) use of inhaled steroids: Secondary | ICD-10-CM | POA: Diagnosis not present

## 2014-12-18 DIAGNOSIS — Z87891 Personal history of nicotine dependence: Secondary | ICD-10-CM | POA: Insufficient documentation

## 2014-12-18 DIAGNOSIS — Z794 Long term (current) use of insulin: Secondary | ICD-10-CM | POA: Insufficient documentation

## 2014-12-18 LAB — CBC
HEMATOCRIT: 34.7 % — AB (ref 35.0–47.0)
Hemoglobin: 11.1 g/dL — ABNORMAL LOW (ref 12.0–16.0)
MCH: 28.6 pg (ref 26.0–34.0)
MCHC: 31.9 g/dL — ABNORMAL LOW (ref 32.0–36.0)
MCV: 89.7 fL (ref 80.0–100.0)
PLATELETS: 301 10*3/uL (ref 150–440)
RBC: 3.87 MIL/uL (ref 3.80–5.20)
RDW: 13.7 % (ref 11.5–14.5)
WBC: 11 10*3/uL (ref 3.6–11.0)

## 2014-12-18 LAB — BASIC METABOLIC PANEL
Anion gap: 10 (ref 5–15)
BUN: 15 mg/dL (ref 6–20)
CO2: 29 mmol/L (ref 22–32)
CREATININE: 1.01 mg/dL — AB (ref 0.44–1.00)
Calcium: 8.7 mg/dL — ABNORMAL LOW (ref 8.9–10.3)
Chloride: 98 mmol/L — ABNORMAL LOW (ref 101–111)
GFR calc Af Amer: >60 mL/min (ref >60)
GFR calc non Af Amer: 55 mL/min — ABNORMAL LOW (ref >60)
GLUCOSE: 226 mg/dL — AB (ref 65–99)
Potassium: 3.8 mmol/L (ref 3.5–5.1)
Sodium: 137 mmol/L (ref 135–145)

## 2014-12-18 LAB — TROPONIN I: Troponin I: <0.03 ng/mL (ref <0.031)

## 2014-12-18 MED ORDER — METHYLPREDNISOLONE SODIUM SUCC 125 MG IJ SOLR
125.0000 mg | Freq: Once | INTRAMUSCULAR | Status: AC
  Administered 2014-12-18: 125 mg via INTRAVENOUS

## 2014-12-18 MED ORDER — METHYLPREDNISOLONE SODIUM SUCC 125 MG IJ SOLR
INTRAMUSCULAR | Status: AC
  Administered 2014-12-18: 125 mg via INTRAVENOUS
  Filled 2014-12-18: qty 2

## 2014-12-18 MED ORDER — ALBUTEROL SULFATE (2.5 MG/3ML) 0.083% IN NEBU
INHALATION_SOLUTION | RESPIRATORY_TRACT | Status: AC
  Administered 2014-12-18: 2.5 mg via RESPIRATORY_TRACT
  Filled 2014-12-18: qty 3

## 2014-12-18 MED ORDER — ALBUTEROL SULFATE (2.5 MG/3ML) 0.083% IN NEBU
2.5000 mg | INHALATION_SOLUTION | Freq: Once | RESPIRATORY_TRACT | Status: AC
  Administered 2014-12-18: 2.5 mg via RESPIRATORY_TRACT

## 2014-12-18 MED ORDER — IPRATROPIUM-ALBUTEROL 0.5-2.5 (3) MG/3ML IN SOLN
3.0000 mL | Freq: Once | RESPIRATORY_TRACT | Status: AC
  Administered 2014-12-18: 3 mL via RESPIRATORY_TRACT

## 2014-12-18 MED ORDER — PREDNISONE 20 MG PO TABS: 60.0000 mg | ORAL_TABLET | Freq: Every day | ORAL | Status: DC

## 2014-12-18 MED ORDER — AZITHROMYCIN 250 MG PO TABS: ORAL_TABLET | ORAL | Status: AC

## 2014-12-18 MED ORDER — DIAZEPAM 2 MG PO TABS
2.0000 mg | ORAL_TABLET | Freq: Once | ORAL | Status: AC
  Administered 2014-12-18: 2 mg via ORAL

## 2014-12-18 MED ORDER — MAGNESIUM SULFATE 2 GM/50ML IV SOLN
2.0000 g | Freq: Once | INTRAVENOUS | Status: AC
Start: 2014-12-18 — End: 2014-12-18
  Administered 2014-12-18: 2 g via INTRAVENOUS

## 2014-12-18 MED ORDER — IPRATROPIUM-ALBUTEROL 0.5-2.5 (3) MG/3ML IN SOLN
RESPIRATORY_TRACT | Status: AC
  Administered 2014-12-18: 3 mL via RESPIRATORY_TRACT
  Filled 2014-12-18: qty 6

## 2014-12-18 MED ORDER — MAGNESIUM SULFATE 2 GM/50ML IV SOLN
INTRAVENOUS | Status: AC
  Administered 2014-12-18: 2 g via INTRAVENOUS
  Filled 2014-12-18: qty 50

## 2014-12-18 MED ORDER — DIAZEPAM 2 MG PO TABS
ORAL_TABLET | ORAL | Status: AC
  Administered 2014-12-18: 2 mg via ORAL
  Filled 2014-12-18: qty 1

## 2014-12-18 NOTE — Discharge Instructions (Signed)
Chronic Obstructive Pulmonary Disease Exacerbation Chronic obstructive pulmonary disease (COPD) is a common lung condition in which airflow from the lungs is limited. COPD is a general term that can be used to describe many different lung problems that limit airflow, including chronic bronchitis and emphysema. COPD exacerbations are episodes when breathing symptoms become much worse and require extra treatment. Without treatment, COPD exacerbations can be life threatening, and frequent COPD exacerbations can cause further damage to your lungs. CAUSES   Respiratory infections.   Exposure to smoke.   Exposure to air pollution, chemical fumes, or dust. Sometimes there is no apparent cause or trigger. RISK FACTORS  Smoking cigarettes.  Older age.  Frequent prior COPD exacerbations. SIGNS AND SYMPTOMS   Increased coughing.   Increased thick spit (sputum) production.   Increased wheezing.   Increased shortness of breath.   Rapid breathing.   Chest tightness. DIAGNOSIS  Your medical history, a physical exam, and tests will help your health care provider make a diagnosis. Tests may include:  A chest X-ray.  Basic lab tests.  Sputum testing.  An arterial blood gas test. TREATMENT  Depending on the severity of your COPD exacerbation, you may need to be admitted to a hospital for treatment. Some of the treatments commonly used to treat COPD exacerbations are:   Antibiotic medicines.   Bronchodilators. These are drugs that expand the air passages. They may be given with an inhaler or nebulizer. Spacer devices may be needed to help improve drug delivery.  Corticosteroid medicines.  Supplemental oxygen therapy.  HOME CARE INSTRUCTIONS   Do not smoke. Quitting smoking is very important to prevent COPD from getting worse and exacerbations from happening as often.  Avoid exposure to all substances that irritate the airway, especially to tobacco smoke.   If you were  prescribed an antibiotic medicine, finish it all even if you start to feel better.  Take all medicines as directed by your health care provider.It is important to use correct technique with inhaled medicines.  Drink enough fluids to keep your urine clear or pale yellow (unless you have a medical condition that requires fluid restriction).  Use a cool mist vaporizer. This makes it easier to clear your chest when you cough.   If you have a home nebulizer and oxygen, continue to use them as directed.   Maintain all necessary vaccinations to prevent infections.   Exercise regularly.   Eat a healthy diet.   Keep all follow-up appointments as directed by your health care provider. SEEK IMMEDIATE MEDICAL CARE IF:  You have worsening shortness of breath.   You have trouble talking.   You have severe chest pain.  You have blood in your sputum.  You have a fever.  You have weakness, vomit repeatedly, or faint.   You feel confused.   You continue to get worse. MAKE SURE YOU:   Understand these instructions.  Will watch your condition.  Will get help right away if you are not doing well or get worse. Document Released: 04/13/2007 Document Revised: 10/31/2013 Document Reviewed: 02/18/2013 [facility] Patient Information 2015 [place], [place]. This information is not intended to replace advice given to you by your health care provider. Make sure you discuss any questions you have with your health care provider.  Shortness of Breath Shortness of breath means you have trouble breathing. It could also mean that you have a medical problem. You should get immediate medical care for shortness of breath. CAUSES   Not enough oxygen in the  air such as with high altitudes or a smoke-filled room.  Certain lung diseases, infections, or problems.  Heart disease or conditions, such as angina or heart failure.  Low red blood cells (anemia).  Poor physical fitness, which can cause  shortness of breath when you exercise.  Chest or back injuries or stiffness.  Being overweight.  Smoking.  Anxiety, which can make you feel like you are not getting enough air. DIAGNOSIS  Serious medical problems can often be found during your physical exam. Tests may also be done to determine why you are having shortness of breath. Tests may include:  Chest X-rays.  Lung function tests.  Blood tests.  An electrocardiogram (ECG).  An ambulatory electrocardiogram. An ambulatory ECG records your heartbeat patterns over a 24-hour period.  Exercise testing.  A transthoracic echocardiogram (TTE). During echocardiography, sound waves are used to evaluate how blood flows through your heart.  A transesophageal echocardiogram (TEE).  Imaging scans. Your health care provider may not be able to find a cause for your shortness of breath after your exam. In this case, it is important to have a follow-up exam with your health care provider as directed.  TREATMENT  Treatment for shortness of breath depends on the cause of your symptoms and can vary greatly. HOME CARE INSTRUCTIONS   Do not smoke. Smoking is a common cause of shortness of breath. If you smoke, ask for help to quit.  Avoid being around chemicals or things that may bother your breathing, such as paint fumes and dust.  Rest as needed. Slowly resume your usual activities.  If medicines were prescribed, take them as directed for the full length of time directed. This includes oxygen and any inhaled medicines.  Keep all follow-up appointments as directed by your health care provider. SEEK MEDICAL CARE IF:   Your condition does not improve in the time expected.  You have a hard time doing your normal activities even with rest.  You have any new symptoms. SEEK IMMEDIATE MEDICAL CARE IF:   Your shortness of breath gets worse.  You feel light-headed, faint, or develop a cough not controlled with medicines.  You start  coughing up blood.  You have pain with breathing.  You have chest pain or pain in your arms, shoulders, or abdomen.  You have a fever.  You are unable to walk up stairs or exercise the way you normally do. MAKE SURE YOU:  Understand these instructions.  Will watch your condition.  Will get help right away if you are not doing well or get worse. Document Released: 03/11/2001 Document Revised: 06/21/2013 Document Reviewed: 09/01/2011 [facility] Patient Information 2015 [place], [place]. This information is not intended to replace advice given to you by your health care provider. Make sure you discuss any questions you have with your health care provider.

## 2014-12-18 NOTE — ED Provider Notes (Signed)
[facility] Emergency Department Provider Note  ____________________________________________  Time seen: Approximately 0018 AM  I have reviewed the triage vital signs and the nursing notes.   HISTORY  Chief Complaint Respiratory Distress    HPI Sherry Prince is a 69 y.o. female with a history of lung cancer and COPD who comes in with difficulty breathing for 3 days. The patient reports that she took albuterol at home prior to calling EMS. The patient reports that she has been having some increased wheezing and that her albuterol has not been helping to improve the symptoms. She reports she feels as though her airway is closing up. She denies a fever or cough but reports she's had some chest pain. The patient has not had any nausea or vomiting. This happen multiple times in the past per the patient. She does wear oxygen at home 2 L by nasal cannula.The patient reports that her pain currently is a 5 out of 10 in intensity.   Past medical history COPD Stage IV lung cancer CHF Asthma Hypertension Diabetes High cholesterol History of breast cancer   There are no active problems to display for this patient.   Past surgical history Hysterectomy Mastectomy Lithotripsy   Current Outpatient Rx  Name  Route  Sig  Dispense  Refill  . atorvastatin (LIPITOR) 40 MG tablet   Oral   Take 40 mg by mouth daily.         . budesonide-formoterol (SYMBICORT) 160-4.5 MCG/ACT inhaler   Inhalation   Inhale 2 puffs into the lungs 2 (two) times daily.         . citalopram (CELEXA) 40 MG tablet   Oral   Take 40 mg by mouth every morning.         . gabapentin (NEURONTIN) 300 MG capsule   Oral   Take 300 mg by mouth at bedtime.         Marland Kitchen glipiZIDE (GLUCOTROL) 5 MG tablet   Oral   Take 5 mg by mouth daily before breakfast.         . insulin detemir (LEVEMIR) 100 UNIT/ML injection   Subcutaneous   Inject 35 Units into the skin at bedtime.         .  isosorbide mononitrate (IMDUR) 30 MG 24 hr tablet   Oral   Take 30 mg by mouth daily.         Marland Kitchen lisinopril (PRINIVIL,ZESTRIL) 10 MG tablet   Oral   Take 10 mg by mouth daily.         Marland Kitchen LORazepam (ATIVAN) 1 MG tablet   Oral   Take 1 mg by mouth every 8 (eight) hours as needed for anxiety.         . metFORMIN (GLUCOPHAGE) 500 MG tablet   Oral   Take by mouth 2 (two) times daily with a meal.         . metoprolol tartrate (LOPRESSOR) 25 MG tablet   Oral   Take 25 mg by mouth 2 (two) times daily.         Marland Kitchen omeprazole (PRILOSEC) 20 MG capsule   Oral   Take 20 mg by mouth daily. PRN         . promethazine (PHENERGAN) 25 MG tablet   Oral   Take 25 mg by mouth every 6 (six) hours as needed for nausea or vomiting.         . traZODone (DESYREL) 50 MG tablet   Oral  Take 50 mg by mouth at bedtime.         Marland Kitchen azithromycin (ZITHROMAX Z-PAK) 250 MG tablet      Take 2 tablets (500 mg) on  Day 1,  followed by 1 tablet (250 mg) once daily on Days 2 through 5.   6 each   0   . predniSONE (DELTASONE) 20 MG tablet   Oral   Take 3 tablets (60 mg total) by mouth daily.   12 tablet   0     Allergies Penicillins  History reviewed. No pertinent family history.  Social History History  Substance Use Topics  . Smoking status: Former Research scientist (life sciences)  . Smokeless tobacco: Not on file  . Alcohol Use: No    Review of Systems Constitutional: No fever/chills Eyes: No visual changes. ENT: No sore throat. Cardiovascular: chest pain. Respiratory: shortness of breath. Gastrointestinal: No abdominal pain.  No nausea, no vomiting.   Genitourinary: Negative for dysuria. Musculoskeletal: Negative for back pain. Skin: Negative for rash. Neurological: Negative for headaches, 10-point ROS otherwise negative.  ____________________________________________   PHYSICAL EXAM:  VITAL SIGNS: ED Triage Vitals  Enc Vitals Group     BP 12/18/14 0036 105/73 mmHg     Pulse Rate 12/18/14  0036 103     Resp 12/18/14 0036 16     Temp 12/18/14 0036 98.6 F (37 C)     Temp Source 12/18/14 0036 Oral     SpO2 12/18/14 0036 100 %     Weight 12/18/14 0036 250 lb (113.399 kg)     Height 12/18/14 0036 '5\' 5"'$  (1.651 m)     Head Cir --      Peak Flow --      Pain Score 12/18/14 0038 5     Pain Loc --      Pain Edu? --      Excl. in [place]? --     Constitutional: Alert and oriented. Well appearing and in moderate distress. Eyes: Conjunctivae are normal. PERRL. EOMI. Head: Atraumatic. Nose: No congestion/rhinnorhea. Mouth/Throat: Mucous membranes are moist.  Oropharynx non-erythematous. Cardiovascular: Tachycardia regular rhythm. Grossly normal heart sounds.  Good peripheral circulation. Respiratory: Normal respiratory effort.  No retractions. Diffuse expiratory wheezing. Gastrointestinal: Soft and nontender. No distention. Positive bowel sounds Musculoskeletal: No lower extremity tenderness nor edema.  Neurologic:  Normal speech and language. No gross focal neurologic deficits are appreciated.  Skin:  Skin is warm, dry and intact. No rash noted. Psychiatric: Mood and affect are normal.   ____________________________________________   LABS (all labs ordered are listed, but only abnormal results are displayed)  Labs Reviewed  CBC - Abnormal; Notable for the following:    Hemoglobin 11.1 (*)    HCT 34.7 (*)    MCHC 31.9 (*)    All other components within normal limits  BASIC METABOLIC PANEL - Abnormal; Notable for the following:    Chloride 98 (*)    Glucose, Bld 226 (*)    Creatinine, Ser 1.01 (*)    Calcium 8.7 (*)    GFR calc non Af Amer 55 (*)    All other components within normal limits  TROPONIN I  TROPONIN I   ____________________________________________  EKG  ED ECG REPORT I, Loney Hering, the attending physician, personally viewed and interpreted this ECG.   Date: 12/18/2014  EKG Time: 0031  Rate: 106  Rhythm: sinus tachycardia  Axis: Normal   Intervals:none  ST&T Change: None  ____________________________________________  RADIOLOGY  Chest x-ray: No active  cardiopulmonary process seen ____________________________________________   PROCEDURES  Procedure(s) performed: None  Critical Care performed: No  ____________________________________________   INITIAL IMPRESSION / ASSESSMENT AND PLAN / ED COURSE  Pertinent labs & imaging results that were available during my care of the patient were reviewed by me and considered in my medical decision making (see chart for details).  This is a 69 year old female with a significant lung disease history who comes in today with shortness of breath and wheezing. I will perform a chest x-ray give the patient some breathing treatments and steroids. I'll reassess the patient once I have obtained the results of her blood work as well as when she's receive her medications.  ----------------------------------------- 5:24 AM on 12/18/2014 -----------------------------------------  Patient received 2 DuoNeb as well as another albuterol neb. The patient also received some Solu-Medrol and magnesium sulfate. The patient's wheezing is improved but when she is sleeping she does have some signs of sleep apnea. The patient does have some tachycardia but did receive multiple nebs. I asked the patient if her breathing did feel improved and she reports that it did. I asked if she felt comfortable enough to be discharged home at that she feel associated to be in the hospital for longer. The patient reports that she feels improved and she would like to be discharged home to try to manage her COPD exacerbation. I will discharge the patient with some steroids as well as some type iodic's. The patient should follow-up with her primary care physician or return to [facility] clinic for evaluation. ____________________________________________   FINAL CLINICAL IMPRESSION(S) / ED DIAGNOSES  Final diagnoses:  COPD  exacerbation  Shortness of breath      Loney Hering, MD 12/18/14 [phone]

## 2014-12-18 NOTE — ED Notes (Signed)
Pt to ED from home via [facility] c/o respiratory distress x3 days.  Pt states getting worse, reports centralized chest pain that is dull and worsens with breathing.  Pt denies n/v/d, no cough at home.  Pt self treated at home today with albuterol and EMS gave one duoneb.  Pt has hx of COPD, HTN, CHF, liver cancer, breast cancer, bone cancer, cervical cancer, and lung cancer.  Pt presents with diminished lung sounds and expiratory wheezing x4.  Pt A&Ox4, speaking in complete and coherent sentences and in NAD at this time.

## 2015-01-16 ENCOUNTER — Ambulatory Visit: Payer: Medicare Other | Admitting: Pain Medicine

## 2015-02-08 ENCOUNTER — Ambulatory Visit: Payer: Medicare Other | Attending: Pain Medicine | Admitting: Pain Medicine

## 2015-02-08 ENCOUNTER — Encounter: Payer: Self-pay | Admitting: Pain Medicine

## 2015-02-08 VITALS — BP 119/69 | HR 85 | Temp 98.2°F | Resp 16 | Wt 260.0 lb

## 2015-02-08 DIAGNOSIS — F419 Anxiety disorder, unspecified: Secondary | ICD-10-CM | POA: Insufficient documentation

## 2015-02-08 DIAGNOSIS — N200 Calculus of kidney: Secondary | ICD-10-CM | POA: Diagnosis not present

## 2015-02-08 DIAGNOSIS — M545 Low back pain: Secondary | ICD-10-CM | POA: Diagnosis present

## 2015-02-08 DIAGNOSIS — C50919 Malignant neoplasm of unspecified site of unspecified female breast: Secondary | ICD-10-CM | POA: Diagnosis not present

## 2015-02-08 DIAGNOSIS — M5136 Other intervertebral disc degeneration, lumbar region: Secondary | ICD-10-CM | POA: Insufficient documentation

## 2015-02-08 DIAGNOSIS — M170 Bilateral primary osteoarthritis of knee: Secondary | ICD-10-CM

## 2015-02-08 DIAGNOSIS — E119 Type 2 diabetes mellitus without complications: Secondary | ICD-10-CM | POA: Diagnosis not present

## 2015-02-08 DIAGNOSIS — F329 Major depressive disorder, single episode, unspecified: Secondary | ICD-10-CM | POA: Diagnosis not present

## 2015-02-08 DIAGNOSIS — M179 Osteoarthritis of knee, unspecified: Secondary | ICD-10-CM | POA: Insufficient documentation

## 2015-02-08 DIAGNOSIS — M1710 Unilateral primary osteoarthritis, unspecified knee: Secondary | ICD-10-CM | POA: Insufficient documentation

## 2015-02-08 DIAGNOSIS — M51369 Other intervertebral disc degeneration, lumbar region without mention of lumbar back pain or lower extremity pain: Secondary | ICD-10-CM | POA: Insufficient documentation

## 2015-02-08 DIAGNOSIS — K219 Gastro-esophageal reflux disease without esophagitis: Secondary | ICD-10-CM | POA: Diagnosis not present

## 2015-02-08 DIAGNOSIS — M79604 Pain in right leg: Secondary | ICD-10-CM | POA: Diagnosis present

## 2015-02-08 DIAGNOSIS — M79605 Pain in left leg: Secondary | ICD-10-CM | POA: Diagnosis present

## 2015-02-08 NOTE — Patient Instructions (Addendum)
Continue present medications Please do not take Excedrin for 5 days prior to your procedure  Geniculate nerve blocks of the knee to be performed at time of return appointment as discussed  F/U PCP Dr Ladoris Gene for evaliation of  BP and general medical  condition  F/U surgical evaluation as discussed  F/U neurological evaluation  May consider radiofrequency rhizolysis or intraspinal procedures pending response to present treatment and F/U evaluation   Patient to call [facility] should patient have concerns prior to scheduled return appointmen. GENERAL RISKS AND COMPLICATIONS  What are the risk, side effects and possible complications? Generally speaking, most procedures are safe.  However, with any procedure there are risks, side effects, and the possibility of complications.  The risks and complications are dependent upon the sites that are lesioned, or the type of nerve block to be performed.  The closer the procedure is to the spine, the more serious the risks are.  Great care is taken when placing the radio frequency needles, block needles or lesioning probes, but sometimes complications can occur. 1. Infection: Any time there is an injection through the skin, there is a risk of infection.  This is why sterile conditions are used for these blocks.  There are four possible types of infection. 1. Localized skin infection. 2. Central Nervous System Infection-This can be in the form of Meningitis, which can be deadly. 3. Epidural Infections-This can be in the form of an epidural abscess, which can cause pressure inside of the spine, causing compression of the spinal cord with subsequent paralysis. This would require an emergency surgery to decompress, and there are no guarantees that the patient would recover from the paralysis. 4. Discitis-This is an infection of the intervertebral discs.  It occurs in about 1% of discography procedures.  It is difficult to treat and it may lead to  surgery.        2. Pain: the needles have to go through skin and soft tissues, will cause soreness.       3. Damage to internal structures:  The nerves to be lesioned may be near blood vessels or    other nerves which can be potentially damaged.       4. Bleeding: Bleeding is more common if the patient is taking blood thinners such as  aspirin, Coumadin, Ticiid, Plavix, etc., or if he/she have some genetic predisposition  such as hemophilia. Bleeding into the spinal canal can cause compression of the spinal  cord with subsequent paralysis.  This would require an emergency surgery to  decompress and there are no guarantees that the patient would recover from the  paralysis.       5. Pneumothorax:  Puncturing of a lung is a possibility, every time a needle is introduced in  the area of the chest or upper back.  Pneumothorax refers to free air around the  collapsed lung(s), inside of the thoracic cavity (chest cavity).  Another two possible  complications related to a similar event would include: Hemothorax and Chylothorax.   These are variations of the Pneumothorax, where instead of air around the collapsed  lung(s), you may have blood or chyle, respectively.       6. Spinal headaches: They may occur with any procedures in the area of the spine.       7. Persistent CSF (Cerebro-Spinal Fluid) leakage: This is a rare problem, but may occur  with prolonged intrathecal or epidural catheters either due to the formation of a fistulous  track or a dural tear.       8. Nerve damage: By working so close to the spinal cord, there is always a possibility of  nerve damage, which could be as serious as a permanent spinal cord injury with  paralysis.       9. Death:  Although rare, severe deadly allergic reactions known as "Anaphylactic  reaction" can occur to any of the medications used.      10. Worsening of the symptoms:  We can always make thing worse.  What are the chances of something like this  happening? Chances of any of this occuring are extremely low.  By statistics, you have more of a chance of getting killed in a motor vehicle accident: while driving to the hospital than any of the above occurring .  Nevertheless, you should be aware that they are possibilities.  In general, it is similar to taking a shower.  Everybody knows that you can slip, hit your head and get killed.  Does that mean that you should not shower again?  Nevertheless always keep in mind that statistics do not mean anything if you happen to be on the wrong side of them.  Even if a procedure has a 1 (one) in a 1,000,000 (million) chance of going wrong, it you happen to be that one..Also, keep in mind that by statistics, you have more of a chance of having something go wrong when taking medications.  Who should not have this procedure? If you are on a blood thinning medication (e.g. Coumadin, Plavix, see list of "Blood Thinners"), or if you have an active infection going on, you should not have the procedure.  If you are taking any blood thinners, please inform your physician.  How should I prepare for this procedure?  Do not eat or drink anything at least six hours prior to the procedure.  Bring a driver with you .  It cannot be a taxi.  Come accompanied by an adult that can drive you back, and that is strong enough to help you if your legs get weak or numb from the local anesthetic.  Take all of your medicines the morning of the procedure with just enough water to swallow them.  If you have diabetes, make sure that you are scheduled to have your procedure done first thing in the morning, whenever possible.  If you have diabetes, take only half of your insulin dose and notify our nurse that you have done so as soon as you arrive at the clinic.  If you are diabetic, but only take blood sugar pills (oral hypoglycemic), then do not take them on the morning of your procedure.  You may take them after you have had the  procedure.  Do not take aspirin or any aspirin-containing medications, at least eleven (11) days prior to the procedure.  They may prolong bleeding.  Wear loose fitting clothing that may be easy to take off and that you would not mind if it got stained with Betadine or blood.  Do not wear any jewelry or perfume  Remove any nail coloring.  It will interfere with some of our monitoring equipment.  NOTE: Remember that this is not meant to be interpreted as a complete list of all possible complications.  Unforeseen problems may occur.  BLOOD THINNERS The following drugs contain aspirin or other products, which can cause increased bleeding during surgery and should not be taken for 2 weeks prior to and 1 week after surgery.  If you should need take something for relief of minor pain, you may take acetaminophen which is found in Tylenol,m Datril, Anacin-3 and Panadol. It is not blood thinner. The products listed below are.  Do not take any of the products listed below in addition to any listed on your instruction sheet.  A.P.C or A.P.C with Codeine Codeine Phosphate Capsules #3 Ibuprofen Ridaura  ABC compound Congesprin Imuran rimadil  Advil Cope Indocin Robaxisal  Alka-Seltzer Effervescent Pain Reliever and Antacid Coricidin or Coricidin-D  Indomethacin Rufen  Alka-Seltzer plus Cold Medicine Cosprin Ketoprofen S-A-C Tablets  Anacin Analgesic Tablets or Capsules Coumadin Korlgesic Salflex  Anacin Extra Strength Analgesic tablets or capsules CP-2 Tablets Lanoril Salicylate  Anaprox Cuprimine Capsules Levenox Salocol  Anexsia-D Dalteparin Magan Salsalate  Anodynos Darvon compound Magnesium Salicylate Sine-off  Ansaid Dasin Capsules Magsal Sodium Salicylate  Anturane Depen Capsules Marnal Soma  APF Arthritis pain formula Dewitt's Pills Measurin Stanback  Argesic Dia-Gesic Meclofenamic Sulfinpyrazone  Arthritis Bayer Timed Release Aspirin Diclofenac Meclomen Sulindac  Arthritis pain formula  Anacin Dicumarol Medipren Supac  Analgesic (Safety coated) Arthralgen Diffunasal Mefanamic Suprofen  Arthritis Strength Bufferin Dihydrocodeine Mepro Compound Suprol  Arthropan liquid Dopirydamole Methcarbomol with Aspirin Synalgos  ASA tablets/Enseals Disalcid Micrainin Tagament  Ascriptin Doan's Midol Talwin  Ascriptin A/D Dolene Mobidin Tanderil  Ascriptin Extra Strength Dolobid Moblgesic Ticlid  Ascriptin with Codeine Doloprin or Doloprin with Codeine Momentum Tolectin  Asperbuf Duoprin Mono-gesic Trendar  Aspergum Duradyne Motrin or Motrin IB Triminicin  Aspirin plain, buffered or enteric coated Durasal Myochrisine Trigesic  Aspirin Suppositories Easprin Nalfon Trillsate  Aspirin with Codeine Ecotrin Regular or Extra Strength Naprosyn Uracel  Atromid-S Efficin Naproxen Ursinus  Auranofin Capsules Elmiron Neocylate Vanquish  Axotal Emagrin Norgesic Verin  Azathioprine Empirin or Empirin with Codeine Normiflo Vitamin E  Azolid Emprazil Nuprin Voltaren  Bayer Aspirin plain, buffered or children's or timed BC Tablets or powders Encaprin Orgaran Warfarin Sodium  Buff-a-Comp Enoxaparin Orudis Zorpin  Buff-a-Comp with Codeine Equegesic Os-Cal-Gesic   Buffaprin Excedrin plain, buffered or Extra Strength Oxalid   Bufferin Arthritis Strength Feldene Oxphenbutazone   Bufferin plain or Extra Strength Feldene Capsules Oxycodone with Aspirin   Bufferin with Codeine Fenoprofen Fenoprofen Pabalate or Pabalate-SF   Buffets II Flogesic Panagesic   Buffinol plain or Extra Strength Florinal or Florinal with Codeine Panwarfarin   Buf-Tabs Flurbiprofen Penicillamine   Butalbital Compound Four-way cold tablets Penicillin   Butazolidin Fragmin Pepto-Bismol   Carbenicillin Geminisyn Percodan   Carna Arthritis Reliever Geopen Persantine   Carprofen Gold's salt Persistin   Chloramphenicol Goody's Phenylbutazone   Chloromycetin Haltrain Piroxlcam   Clmetidine heparin Plaquenil   Cllnoril Hyco-pap  Ponstel   Clofibrate Hydroxy chloroquine Propoxyphen         Before stopping any of these medications, be sure to consult the physician who ordered them.  Some, such as Coumadin (Warfarin) are ordered to prevent or treat serious conditions such as "deep thrombosis", "pumonary embolisms", and other heart problems.  The amount of time that you may need off of the medication may also vary with the medication and the reason for which you were taking it.  If you are taking any of these medications, please make sure you notify your pain physician before you undergo any procedures.         Knee Injection Joint injections are shots. Your caregiver will place a needle into your knee joint. The needle is used to put medicine into the joint. These shots can be used to help treat  different painful knee conditions such as osteoarthritis, bursitis, local flare-ups of rheumatoid arthritis, and pseudogout. Anti-inflammatory medicines such as corticosteroids and anesthetics are the most common medicines used for joint and soft tissue injections.  PROCEDURE 2. The skin over the kneecap will be cleaned with an antiseptic solution. 3. Your caregiver will inject a small amount of a local anesthetic (a medicine like Novocaine) just under the skin in the area that was cleaned. 4. After the area becomes numb, a second injection is done. This second injection usually includes an anesthetic and an anti-inflammatory medicine called a steroid or cortisone. The needle is carefully placed in between the kneecap and the knee, and the medicine is injected into the joint space. 5. After the injection is done, the needle is removed. Your caregiver may place a bandage over the injection site. The whole procedure takes no more than a couple of minutes. BEFORE THE PROCEDURE  Wash all of the skin around the entire knee area. Try to remove any loose, scaling skin. There is no other specific preparation necessary unless advised  otherwise by your caregiver. LET YOUR CAREGIVER KNOW ABOUT:   Allergies.  Medications taken including herbs, eye drops, over the counter medications, and creams.  Use of steroids (by mouth or creams).  Possible pregnancy, if applicable.  Previous problems with anesthetics or Novocaine.  History of blood clots (thrombophlebitis).  History of bleeding or blood problems.  Previous surgery.  Other health problems. RISKS AND COMPLICATIONS Side effects from cortisone shots are rare. They include:   Slight bruising of the skin.  Shrinkage of the normal fatty tissue under the skin where the shot was given.  Increase in pain after the shot.  Infection.  Weakening of tendons or tendon rupture.  Allergic reaction to the medicine.  Diabetics may have a temporary increase in their blood sugar after a shot.  Cortisone can temporarily weaken the immune system. While receiving these shots, you should not get certain vaccines. Also, avoid contact with anyone who has chickenpox or measles. Especially if you have never had these diseases or have not been previously immunized. Your immune system may not be strong enough to fight off the infection while the cortisone is in your system. AFTER THE PROCEDURE   You can go home after the procedure.  You may need to put ice on the joint 15-20 minutes every 3 or 4 hours until the pain goes away.  You may need to put an elastic bandage on the joint. HOME CARE INSTRUCTIONS  12. Only take over-the-counter or prescription medicines for pain, discomfort, or fever as directed by your caregiver. 13. You should avoid stressing the joint. Unless advised otherwise, avoid activities that put a lot of pressure on a knee joint, such as: 1. Jogging. 2. Bicycling. 3. Recreational climbing. 4. Hiking. 14. Laying down and elevating the leg/knee above the level of your heart can help to minimize swelling. SEEK MEDICAL CARE IF:   You have repeated or  worsening swelling.  There is drainage from the puncture area.  You develop red streaking that extends above or below the site where the needle was inserted. SEEK IMMEDIATE MEDICAL CARE IF:   You develop a fever.  You have pain that gets worse even though you are taking pain medicine.  The area is red and warm, and you have trouble moving the joint. MAKE SURE YOU:   Understand these instructions.  Will watch your condition.  Will get help right away if you are not  doing well or get worse. Document Released: 09/07/2006 Document Revised: 09/08/2011 Document Reviewed: 06/04/2007 [facility] Patient Information 2015 [place], [place]. This information is not intended to replace advice given to you by your health care provider. Make sure you discuss any questions you have with your health care provider.

## 2015-02-08 NOTE — Progress Notes (Signed)
Subjective:    Patient ID: Sherry Prince, female    DOB: [birth year], 69 y.o.   MRN: [id number]  HPI  Patient is 69 year old female who comes to pain management Center at the request of  Dr.Mancheno Revelo for further evaluation and treatment of lower back lower extremity pain with pain involving the region of the knee. Patient is with history of carcinoma of multiple regions. Patient stated that her knee pain was quite significant and was independent with all activities of daily living. Patient stated that she is undergone orthopedic evaluation and that she is not an operative candidate for total knee replacement due to her general medical condition. We discussed patient's condition on today's visit and patient described her pain as aching agonizing unknowing sharp shooting stabbing throbbing toothache-like sensation and dull pain as well. Patient stated the pain awakens her from sleep and interferes with ability to go to sleep. Patient states the pain was associated with weakness and that the pain increases with walking sitting standing stooping squatting motion kneeling bending. Patient stated the pain decreased with resting. We discussed patient's overall condition and discussed performing geniculate nerve blocks of the knee at time return appointment in attempt to decrease severity of patient's symptoms, minimize progression of symptoms, and avoid the need for more involved treatment. He also discussed radiofrequency procedures as well as intra-articular injections. We will consider patient for geniculate nerve blocks at time return appointment and will consider additional modification of treatment as discussed and explained to patient on today's visit is understanding and in agreement status treatment plan.   Review of Systems   Cardiovascular Heart trouble Congestive heart failure  Pulmonary Asthma Emphysema Sleep  apnea  Neurological Unremarkable  Psychological Anxiety Depression  Gastrointestinal Gastroesophageal reflux disease  Genitourinary Kidney disease  Hematological Unremarkable  Endocrine Diabetes mellitus   Rheumatological Unremarkable   Musculoskeletal Unremarkable  Other significant Stage IV breast carcinoma       Objective:   Physical Exam    There was tenderness of the splenius capitis and occipitalis musculature regions. Palpation of which reproduced mild discomfort. There was mild tenderness of the cervical facet cervical paraspinal muscles as well as the thoracic facet and thoracic paraspinal musculature region. There was mild tinnitus of the acromioclavicular and glenohumeral joint region. Patient was a tends to palpation over the thoracic facet thoracic paraspinal musculature region of mild to moderate degree. No crepitus of the thoracic region was noted. Appear to be unremarkable Spurling's maneuver. Tinel and Phalen's maneuver were without increase of pain significant degree. Palpation over the lumbar paraspinal muscle lumbar facet region was tends to palpation of mild to moderate degree. Lateral bending and rotation and extension and palpation of the lumbar facets reproduce mild to moderate discomfort. There was mild tenderness over the PSIS PSIS region. Palpation over the region of the greater trochanteric region iliotibial band region was with moderate tends to palpation. Patient appeared to be with decreased EHL strength. There was no sensory deficit of dermatomal distribution detected. There was negative clonus negative Homans. The knee was a tends to palpation without increased pain in crepitus of the knee with range of motion maneuvers of the knee. There was no increased warmth or erythema noted in the region of the knee. There was negative anterior and posterior drawer signs. No ballottement of the patella was noted. There was nontender and no costovertebral  angle tenderness was noted.     Assessment & Plan:  Degenerative disc disease lumbar spine  Degenerative joint  disease of knee  Diabetes mellitus  Carcinoma the progress stage IV  Nephrolithiasis  Anxiety/Depression    Plan   Continue present medications  Geniculate nerve blocks of the need to be performed at time of return appointment  F/U PCP Dr.Mancheno Revelo   for evaliation of  BP and general medical  condition  F/U surgical evaluationAs discussed  F/U neurological evaluation  May consider radiofrequency rhizolysis or intraspinal procedures pending response to present treatment and F/U evaluation   Patient to call Pain Management Center should patient have concerns prior to scheduled return appointmen.

## 2015-02-08 NOTE — Progress Notes (Signed)
Safety precautions to be maintained throughout the outpatient stay will include: orient to surroundings, keep bed in low position, maintain call bell within reach at all times, provide assistance with transfer out of bed and ambulation.  

## 2015-02-08 NOTE — Progress Notes (Signed)
Discharged to home , ambulatory.  Pre procedure instructions given with teach back 3 done.

## 2015-02-14 ENCOUNTER — Ambulatory Visit: Payer: Medicare Other | Attending: Pain Medicine | Admitting: Pain Medicine

## 2015-02-14 VITALS — BP 139/87 | HR 78 | Temp 98.1°F | Resp 16 | Ht 65.0 in | Wt 260.0 lb

## 2015-02-14 DIAGNOSIS — Z96652 Presence of left artificial knee joint: Secondary | ICD-10-CM | POA: Diagnosis not present

## 2015-02-14 DIAGNOSIS — M79605 Pain in left leg: Secondary | ICD-10-CM | POA: Diagnosis present

## 2015-02-14 DIAGNOSIS — M1711 Unilateral primary osteoarthritis, right knee: Secondary | ICD-10-CM | POA: Diagnosis not present

## 2015-02-14 DIAGNOSIS — M5136 Other intervertebral disc degeneration, lumbar region: Secondary | ICD-10-CM

## 2015-02-14 DIAGNOSIS — M170 Bilateral primary osteoarthritis of knee: Secondary | ICD-10-CM

## 2015-02-14 DIAGNOSIS — M545 Low back pain: Secondary | ICD-10-CM | POA: Insufficient documentation

## 2015-02-14 DIAGNOSIS — M79604 Pain in right leg: Secondary | ICD-10-CM | POA: Diagnosis present

## 2015-02-14 MED ORDER — BUPIVACAINE HCL (PF) 0.25 % IJ SOLN
INTRAMUSCULAR | Status: AC
  Administered 2015-02-14: 10 mL
  Filled 2015-02-14: qty 30

## 2015-02-14 MED ORDER — FENTANYL CITRATE (PF) 100 MCG/2ML IJ SOLN
INTRAMUSCULAR | Status: AC
  Administered 2015-02-14: 100 ug via INTRAVENOUS
  Filled 2015-02-14: qty 2

## 2015-02-14 MED ORDER — CIPROFLOXACIN IN D5W 400 MG/200ML IV SOLN
INTRAVENOUS | Status: AC
  Administered 2015-02-14: 400 mg via INTRAVENOUS
  Filled 2015-02-14: qty 200

## 2015-02-14 MED ORDER — TRIAMCINOLONE ACETONIDE 40 MG/ML IJ SUSP
INTRAMUSCULAR | Status: AC
  Administered 2015-02-14: 10 mg
  Filled 2015-02-14: qty 1

## 2015-02-14 MED ORDER — CIPROFLOXACIN HCL 250 MG PO TABS: 250.0000 mg | ORAL_TABLET | Freq: Two times a day (BID) | ORAL | Status: DC

## 2015-02-14 MED ORDER — MIDAZOLAM HCL 5 MG/5ML IJ SOLN
INTRAMUSCULAR | Status: AC
  Administered 2015-02-14: 3 mg via INTRAVENOUS
  Filled 2015-02-14: qty 5

## 2015-02-14 MED ORDER — ORPHENADRINE CITRATE 30 MG/ML IJ SOLN
INTRAMUSCULAR | Status: AC
  Filled 2015-02-14: qty 2

## 2015-02-14 NOTE — Patient Instructions (Addendum)
Continue present medications and begin taking antibiotic Cipro as prescribed. Please obtain your antibiotic Cipro today and begin taking antibiotic today  F/U PCP for evaliation of  BP and general medical  condition.  F/U surgical evaluation as discussed  F/U neurological evaluation.  May consider radiofrequency rhizolysis or intraspinal procedures pending response to present treatment and F/U evaluation.  Patient to call Pain Management Center should patient have concerns prior to scheduled return appointment.   A prescription for Cipro was sent to your pharmacy. Pain Management Discharge Instructions  General Discharge Instructions :  If you need to reach your doctor call: Monday-Friday 8:00 am - 4:00 pm at [phone] or toll free [phone].  After clinic hours [phone] to have operator reach doctor.  Bring all of your medication bottles to all your appointments in the pain clinic.  To cancel or reschedule your appointment with Pain Management please remember to call 24 hours in advance to avoid a fee.  Refer to the educational materials which you have been given on: General Risks, I had my Procedure. Discharge Instructions, Post Sedation.  Post Procedure Instructions:  The drugs you were given will stay in your system until tomorrow, so for the next 24 hours you should not drive, make any legal decisions or drink any alcoholic beverages.  You may eat anything you prefer, but it is better to start with liquids then soups and crackers, and gradually work up to solid foods.  Please notify your doctor immediately if you have any unusual bleeding, trouble breathing or pain that is not related to your normal pain.  Depending on the type of procedure that was done, some parts of your body may feel week and/or numb.  This usually clears up by tonight or the next day.  Walk with the use of an assistive device or accompanied by an adult for the 24 hours.  You may use ice on  the affected area for the first 24 hours.  Put ice in a Ziploc bag and cover with a towel and place against area 15 minutes on 15 minutes off.  You may switch to heat after 24 hours.

## 2015-02-14 NOTE — Progress Notes (Signed)
Subjective:    Patient ID: Sherry Prince, female    DOB: Mar 24, 1946, 69 y.o.   MRN: [id number]  HPI  .Geniculate nerve blocks of the  right knee   The patient is a 69 y.o. female who returns to the [facility] for further evaluation and treatment of pain involving the lumbar lower extremity region with severe pain of the  right knee. Prior studies reveal patient to be with  severe degenerative joint disease of the knee and patient is with prior left total knee replacement . The patient is with severely disabling pain of the right knee and wished to proceed with interventional treatment in attempt to decrease severity of symptoms, minimize progression of symptoms, and hopefully avoid the need for more involved treatment including right total knee replacement.  We will proceed with geniculate nerve blocks of the  right knee in an attempt to decrease severity of symptoms, minimize the risk of medication escalation, hopefully retard progression of symptoms and avoid the need for more involved treatment.  The risks benefits and expectations of the procedure were discussed with the patient. The patient was with understanding and in agreement with suggested treatment plan.  DESCRIPTION OF PROCEDURE: Geniculate nerve blocks of the  right knee. The  procedure was performed with IV Versed and IV fentanyl, conscious sedation and under fluoroscopic guidance.  NEEDLE PLACEMENT FOR BLOCK OF THE LATERAL SUPERIOR GENICULATE NERVE: The patient was taking to the fluoroscopy suite. With the patient supine, with knee in flexed position, Betadine prep of proposed entry site accomplished.  IV Versed, IV fentanyl conscious sedation, EKG, blood pressure, pulse and pulse oximetry monitoring were all in place. Under fluoroscopic guidance, a 22-gauge needle was inserted in the region of the  right knee with needle placed at the lateral border of the femur at the junction of the shaft of the femur and the condyle of the  femur.  Following needle placement at the lateral aspect of the knee, needle placement was then accomplished in the region of the medial aspect of the knee.  NEEDLE PLACEMENT FOR BLOCK OF THE MEDIAL SUPERIOR GENICULATE NERVE:  Under fluoroscopic guidance, a 22 - gauge needle was inserted in the region of the  right knee with needle placed at the medial border of the femur at the junction of the shaft of the femur and the condyle of the femur.   NEEDLE PLACEMENT FOR BLOCK OF THE MEDIAL INFERIOR GENICULATE NERVE:  Under fluoroscopic guidance, a 22 - gauge needle was inserted in the region of the  Right right knee with needle placed at the junction of the shaft and plateau of the tibia.   Following needle placement on AP view of needles placed in all three locations, placement was then verified on lateral view with the tips of the superior lateral and superior medial needles documented to be one half the distance of the shaft of the femur and the tip of the inferior medial geniculate needle documented to be one half the distance of the shaft of the tibia.  Following documentation of needle placements on lateral view, each needle was injected with one mL of 0.25% bupivacaine with Kenalog. A total of 10 mg of Kenalog was utilized for the entire procedure. The patient tolerated the procedure well.    PLAN 1. Medications: Continue present medications 2. Follow-up appointment with PCP Dr.Mancheno Revelo  for evaluation of blood pressure and general medical condition. 3. Follow-up surgical evaluation as discussed 4. Follow-up neurological evaluation  5. He patient may be a candidate for radiofrequency rhizolysis and other treatment pending response to treatment on today's visit and follow-up evaluation. 6. The patient is advised to adhere to proper body mechanics and avoid activities which appear to aggravate condition     Review of Systems     Objective:   Physical Exam        Assessment & Plan:

## 2015-02-14 NOTE — Progress Notes (Signed)
Safety precautions to be maintained throughout the outpatient stay will include: orient to surroundings, keep bed in low position, maintain call bell within reach at all times, provide assistance with transfer out of bed and ambulation.  

## 2015-02-15 ENCOUNTER — Telehealth: Payer: Self-pay | Admitting: *Deleted

## 2015-02-15 NOTE — Telephone Encounter (Signed)
Message left

## 2015-03-01 ENCOUNTER — Other Ambulatory Visit: Payer: Self-pay | Admitting: Pain Medicine

## 2015-03-20 ENCOUNTER — Ambulatory Visit: Payer: Medicare Other | Attending: Pain Medicine | Admitting: Pain Medicine

## 2015-03-20 ENCOUNTER — Encounter: Payer: Self-pay | Admitting: Pain Medicine

## 2015-03-20 VITALS — BP 118/58 | HR 95 | Temp 98.0°F | Resp 18 | Ht 65.0 in | Wt 260.0 lb

## 2015-03-20 DIAGNOSIS — N200 Calculus of kidney: Secondary | ICD-10-CM | POA: Insufficient documentation

## 2015-03-20 DIAGNOSIS — F419 Anxiety disorder, unspecified: Secondary | ICD-10-CM | POA: Diagnosis not present

## 2015-03-20 DIAGNOSIS — M25561 Pain in right knee: Secondary | ICD-10-CM | POA: Diagnosis present

## 2015-03-20 DIAGNOSIS — M5136 Other intervertebral disc degeneration, lumbar region: Secondary | ICD-10-CM | POA: Insufficient documentation

## 2015-03-20 DIAGNOSIS — M5481 Occipital neuralgia: Secondary | ICD-10-CM

## 2015-03-20 DIAGNOSIS — M51369 Other intervertebral disc degeneration, lumbar region without mention of lumbar back pain or lower extremity pain: Secondary | ICD-10-CM

## 2015-03-20 DIAGNOSIS — M179 Osteoarthritis of knee, unspecified: Secondary | ICD-10-CM | POA: Insufficient documentation

## 2015-03-20 DIAGNOSIS — M170 Bilateral primary osteoarthritis of knee: Secondary | ICD-10-CM

## 2015-03-20 DIAGNOSIS — E119 Type 2 diabetes mellitus without complications: Secondary | ICD-10-CM | POA: Diagnosis not present

## 2015-03-20 DIAGNOSIS — M25562 Pain in left knee: Secondary | ICD-10-CM | POA: Diagnosis present

## 2015-03-20 DIAGNOSIS — F329 Major depressive disorder, single episode, unspecified: Secondary | ICD-10-CM | POA: Diagnosis not present

## 2015-03-20 DIAGNOSIS — C801 Malignant (primary) neoplasm, unspecified: Secondary | ICD-10-CM | POA: Insufficient documentation

## 2015-03-20 MED ORDER — TRAMADOL HCL 50 MG PO TABS: ORAL_TABLET | ORAL | Status: DC

## 2015-03-20 NOTE — Progress Notes (Signed)
Subjective:    Patient ID: Sherry Prince, female    DOB: 01-Aug-1945, 69 y.o.   MRN: [id number]  HPI  Patient is 69 year old female returns to [facility] for further evaluation and treatment of pain involving the region of the knee. Patient has significant improvement of pain following previous geniculate nerve blocks of the knee. Patient stated that she had greater than 50% relief of pain following the procedure. Patient notes return of pain of the knee at this time and wishes to proceed with interventional treatment to prevent the severity of the pain returning to previous level of intensity. We discussed patient's condition and will proceed with interventional treatment consisting of geniculate nerve blocks of the knee at time return appointment as discussed with patient on today's visit. The patient was in agreement with suggested treatment plan.. The patient was also given prescription for tramadol on today's visit. Patient admits taken tramadol previously. We explained to patient that respiratory depression confusion and excessive drowsiness can occur with the use of tramadol. The patient expressed understanding and willingness to comply to avoid undesirable side effects. We'll proceed with geniculate nerve blocks of the knee at time return appointment moderate dictation of treatment pending response to treatment and follow-up evaluation. The patient agreed with suggested treatment plan.    Review of Systems     Objective:   Physical Exam  There was tends to palpation of the splenius capitis and occipitalis musculature region of mild degree. There was mild tinnitus of the acromioclavicular and glenohumeral joint region. There appeared to be no significant increase of pain with Tinel and Phalen's maneuver. Patient appeared to be with bilaterally equal grip strength. Palpation of the thoracic facet thoracic paraspinal musculature region reproduced minimal discomfort. Palpation over  the thoracic facet thoracic paraspinal musculature region was with minimal increase of pain is well. Palpation over the lumbar paraspinal muscles region lumbar facet region moderate tends to palpation of mild to moderate degree. Lateral bending and rotation and extension and palpation of the lumbar facets reproduce mild to moderate discomfort.palpation of the knee reproduced pain of moderate degree with moderate crepitus of the knee noted and there was no ballottement of the patella was noted . No increased warmth or erythema of the knee was noted. Range of motion maneuvers of the knee reproduce moderately severe discomfort.Straight leg raising was tolerates approximately 20 without a definite increase of pain with dorsiflexion noted. There was negative clonus negative Homans. Palpation of the greater trochanteric region and iliotibial band region reproduced pain of mild degree. No sensory deficit of dermatomal distribution was detected. There was negative clonus negative Homans. Abdomen was nontender with no costovertebral angle tenderness noted.      Assessment & Plan:    Degenerative disc disease lumbar spine  Degenerative joint disease of knee  Diabetes mellitus  Carcinoma the progress stage IV  Nephrolithiasis  Anxiety/Depression    PLAN   Continue present medication. The patient was given prescription for tramadol and was cautioned regarding respiratory depression confusion and excessive sedation which can occur with the medication . The patient expressed understanding and willingness to comply to avoid undesirable side effects  Geniculate nerve blocks of need to be performed at time of return appointment  F/U PCP  Dr.Mancheno Revelo for evaliation of  BP and general medical  condition  F/U surgical evaluation. May consider pending follow-up evaluations  F/U neurological evaluation. May consider pending follow-up evaluations  May consider radiofrequency rhizolysis or  intraspinal procedures pending response  to present treatment and F/U evaluation   Patient to call Pain Management Center should patient have concerns prior to scheduled return appointment.

## 2015-03-20 NOTE — Patient Instructions (Addendum)
PLAN   Continue present medication tramadol. Caution tramadol can cause drowsiness respiratory depression confusion and other side effects be extremely cautious when taking tramadol   Geniculate nerve blocks of need to be performed at time of return appointment  F/U PCP Dr.Mancheno Revelo  for evaliation of  BP and general medical  condition  F/U surgical evaluation. May consider pending follow-up evaluations  F/U neurological evaluation. May consider pending follow-up evaluations  May consider radiofrequency rhizolysis or intraspinal procedures pending response to present treatment and F/U evaluation   Patient to call [facility] should patient have concerns prior to scheduled return appointment. Knee Injection Joint injections are shots. Your caregiver will place a needle into your knee joint. The needle is used to put medicine into the joint. These shots can be used to help treat different painful knee conditions such as osteoarthritis, bursitis, local flare-ups of rheumatoid arthritis, and pseudogout. Anti-inflammatory medicines such as corticosteroids and anesthetics are the most common medicines used for joint and soft tissue injections.  PROCEDURE 1. The skin over the kneecap will be cleaned with an antiseptic solution. 2. Your caregiver will inject a small amount of a local anesthetic (a medicine like Novocaine) just under the skin in the area that was cleaned. 3. After the area becomes numb, a second injection is done. This second injection usually includes an anesthetic and an anti-inflammatory medicine called a steroid or cortisone. The needle is carefully placed in between the kneecap and the knee, and the medicine is injected into the joint space. 4. After the injection is done, the needle is removed. Your caregiver may place a bandage over the injection site. The whole procedure takes no more than a couple of minutes. BEFORE THE PROCEDURE  Wash all of the skin around  the entire knee area. Try to remove any loose, scaling skin. There is no other specific preparation necessary unless advised otherwise by your caregiver. LET YOUR CAREGIVER KNOW ABOUT:   Allergies.  Medications taken including herbs, eye drops, over the counter medications, and creams.  Use of steroids (by mouth or creams).  Possible pregnancy, if applicable.  Previous problems with anesthetics or Novocaine.  History of blood clots (thrombophlebitis).  History of bleeding or blood problems.  Previous surgery.  Other health problems. RISKS AND COMPLICATIONS Side effects from cortisone shots are rare. They include:   Slight bruising of the skin.  Shrinkage of the normal fatty tissue under the skin where the shot was given.  Increase in pain after the shot.  Infection.  Weakening of tendons or tendon rupture.  Allergic reaction to the medicine.  Diabetics may have a temporary increase in their blood sugar after a shot.  Cortisone can temporarily weaken the immune system. While receiving these shots, you should not get certain vaccines. Also, avoid contact with anyone who has chickenpox or measles. Especially if you have never had these diseases or have not been previously immunized. Your immune system may not be strong enough to fight off the infection while the cortisone is in your system. AFTER THE PROCEDURE   You can go home after the procedure.  You may need to put ice on the joint 15-20 minutes every 3 or 4 hours until the pain goes away.  You may need to put an elastic bandage on the joint. HOME CARE INSTRUCTIONS  1. Only take over-the-counter or prescription medicines for pain, discomfort, or fever as directed by your caregiver. 2. You should avoid stressing the joint. Unless advised otherwise,  avoid activities that put a lot of pressure on a knee joint, such as: 1. Jogging. 2. Bicycling. 3. Recreational climbing. 4. Hiking. 3. Laying down and elevating the  leg/knee above the level of your heart can help to minimize swelling. SEEK MEDICAL CARE IF:   You have repeated or worsening swelling.  There is drainage from the puncture area.  You develop red streaking that extends above or below the site where the needle was inserted. SEEK IMMEDIATE MEDICAL CARE IF:   You develop a fever.  You have pain that gets worse even though you are taking pain medicine.  The area is red and warm, and you have trouble moving the joint. MAKE SURE YOU:   Understand these instructions.  Will watch your condition.  Will get help right away if you are not doing well or get worse. Document Released: 09/07/2006 Document Revised: 09/08/2011 Document Reviewed: 06/04/2007 [facility] Patient Information 2015 [place], [place]. This information is not intended to replace advice given to you by your health care provider. Make sure you discuss any questions you have with your health care provider. GENERAL RISKS AND COMPLICATIONS  What are the risk, side effects and possible complications? Generally speaking, most procedures are safe.  However, with any procedure there are risks, side effects, and the possibility of complications.  The risks and complications are dependent upon the sites that are lesioned, or the type of nerve block to be performed.  The closer the procedure is to the spine, the more serious the risks are.  Great care is taken when placing the radio frequency needles, block needles or lesioning probes, but sometimes complications can occur. 1. Infection: Any time there is an injection through the skin, there is a risk of infection.  This is why sterile conditions are used for these blocks.  There are four possible types of infection. 1. Localized skin infection. 2. Central Nervous System Infection-This can be in the form of Meningitis, which can be deadly. 3. Epidural Infections-This can be in the form of an epidural abscess, which can cause pressure inside of  the spine, causing compression of the spinal cord with subsequent paralysis. This would require an emergency surgery to decompress, and there are no guarantees that the patient would recover from the paralysis. 4. Discitis-This is an infection of the intervertebral discs.  It occurs in about 1% of discography procedures.  It is difficult to treat and it may lead to surgery.        2. Pain: the needles have to go through skin and soft tissues, will cause soreness.       3. Damage to internal structures:  The nerves to be lesioned may be near blood vessels or    other nerves which can be potentially damaged.       4. Bleeding: Bleeding is more common if the patient is taking blood thinners such as  aspirin, Coumadin, Ticiid, Plavix, etc., or if he/she have some genetic predisposition  such as hemophilia. Bleeding into the spinal canal can cause compression of the spinal  cord with subsequent paralysis.  This would require an emergency surgery to  decompress and there are no guarantees that the patient would recover from the  paralysis.       5. Pneumothorax:  Puncturing of a lung is a possibility, every time a needle is introduced in  the area of the chest or upper back.  Pneumothorax refers to free air around the  collapsed lung(s), inside of the thoracic  cavity (chest cavity).  Another two possible  complications related to a similar event would include: Hemothorax and Chylothorax.   These are variations of the Pneumothorax, where instead of air around the collapsed  lung(s), you may have blood or chyle, respectively.       6. Spinal headaches: They may occur with any procedures in the area of the spine.       7. Persistent CSF (Cerebro-Spinal Fluid) leakage: This is a rare problem, but may occur  with prolonged intrathecal or epidural catheters either due to the formation of a fistulous  track or a dural tear.       8. Nerve damage: By working so close to the spinal cord, there is always a possibility of   nerve damage, which could be as serious as a permanent spinal cord injury with  paralysis.       9. Death:  Although rare, severe deadly allergic reactions known as "Anaphylactic  reaction" can occur to any of the medications used.      10. Worsening of the symptoms:  We can always make thing worse.  What are the chances of something like this happening? Chances of any of this occuring are extremely low.  By statistics, you have more of a chance of getting killed in a motor vehicle accident: while driving to the hospital than any of the above occurring .  Nevertheless, you should be aware that they are possibilities.  In general, it is similar to taking a shower.  Everybody knows that you can slip, hit your head and get killed.  Does that mean that you should not shower again?  Nevertheless always keep in mind that statistics do not mean anything if you happen to be on the wrong side of them.  Even if a procedure has a 1 (one) in a 1,000,000 (million) chance of going wrong, it you happen to be that one..Also, keep in mind that by statistics, you have more of a chance of having something go wrong when taking medications.  Who should not have this procedure? If you are on a blood thinning medication (e.g. Coumadin, Plavix, see list of "Blood Thinners"), or if you have an active infection going on, you should not have the procedure.  If you are taking any blood thinners, please inform your physician.  How should I prepare for this procedure?  Do not eat or drink anything at least six hours prior to the procedure.  Bring a driver with you .  It cannot be a taxi.  Come accompanied by an adult that can drive you back, and that is strong enough to help you if your legs get weak or numb from the local anesthetic.  Take all of your medicines the morning of the procedure with just enough water to swallow them.  If you have diabetes, make sure that you are scheduled to have your procedure done first thing  in the morning, whenever possible.  If you have diabetes, take only half of your insulin dose and notify our nurse that you have done so as soon as you arrive at the clinic.  If you are diabetic, but only take blood sugar pills (oral hypoglycemic), then do not take them on the morning of your procedure.  You may take them after you have had the procedure.  Do not take aspirin or any aspirin-containing medications, at least eleven (11) days prior to the procedure.  They may prolong bleeding.  Wear loose fitting clothing that  may be easy to take off and that you would not mind if it got stained with Betadine or blood.  Do not wear any jewelry or perfume  Remove any nail coloring.  It will interfere with some of our monitoring equipment.  NOTE: Remember that this is not meant to be interpreted as a complete list of all possible complications.  Unforeseen problems may occur.  BLOOD THINNERS The following drugs contain aspirin or other products, which can cause increased bleeding during surgery and should not be taken for 2 weeks prior to and 1 week after surgery.  If you should need take something for relief of minor pain, you may take acetaminophen which is found in Tylenol,m Datril, Anacin-3 and Panadol. It is not blood thinner. The products listed below are.  Do not take any of the products listed below in addition to any listed on your instruction sheet.  A.P.C or A.P.C with Codeine Codeine Phosphate Capsules #3 Ibuprofen Ridaura  ABC compound Congesprin Imuran rimadil  Advil Cope Indocin Robaxisal  Alka-Seltzer Effervescent Pain Reliever and Antacid Coricidin or Coricidin-D  Indomethacin Rufen  Alka-Seltzer plus Cold Medicine Cosprin Ketoprofen S-A-C Tablets  Anacin Analgesic Tablets or Capsules Coumadin Korlgesic Salflex  Anacin Extra Strength Analgesic tablets or capsules CP-2 Tablets Lanoril Salicylate  Anaprox Cuprimine Capsules Levenox Salocol  Anexsia-D Dalteparin Magan Salsalate   Anodynos Darvon compound Magnesium Salicylate Sine-off  Ansaid Dasin Capsules Magsal Sodium Salicylate  Anturane Depen Capsules Marnal Soma  APF Arthritis pain formula Dewitt's Pills Measurin Stanback  Argesic Dia-Gesic Meclofenamic Sulfinpyrazone  Arthritis Bayer Timed Release Aspirin Diclofenac Meclomen Sulindac  Arthritis pain formula Anacin Dicumarol Medipren Supac  Analgesic (Safety coated) Arthralgen Diffunasal Mefanamic Suprofen  Arthritis Strength Bufferin Dihydrocodeine Mepro Compound Suprol  Arthropan liquid Dopirydamole Methcarbomol with Aspirin Synalgos  ASA tablets/Enseals Disalcid Micrainin Tagament  Ascriptin Doan's Midol Talwin  Ascriptin A/D Dolene Mobidin Tanderil  Ascriptin Extra Strength Dolobid Moblgesic Ticlid  Ascriptin with Codeine Doloprin or Doloprin with Codeine Momentum Tolectin  Asperbuf Duoprin Mono-gesic Trendar  Aspergum Duradyne Motrin or Motrin IB Triminicin  Aspirin plain, buffered or enteric coated Durasal Myochrisine Trigesic  Aspirin Suppositories Easprin Nalfon Trillsate  Aspirin with Codeine Ecotrin Regular or Extra Strength Naprosyn Uracel  Atromid-S Efficin Naproxen Ursinus  Auranofin Capsules Elmiron Neocylate Vanquish  Axotal Emagrin Norgesic Verin  Azathioprine Empirin or Empirin with Codeine Normiflo Vitamin E  Azolid Emprazil Nuprin Voltaren  Bayer Aspirin plain, buffered or children's or timed BC Tablets or powders Encaprin Orgaran Warfarin Sodium  Buff-a-Comp Enoxaparin Orudis Zorpin  Buff-a-Comp with Codeine Equegesic Os-Cal-Gesic   Buffaprin Excedrin plain, buffered or Extra Strength Oxalid   Bufferin Arthritis Strength Feldene Oxphenbutazone   Bufferin plain or Extra Strength Feldene Capsules Oxycodone with Aspirin   Bufferin with Codeine Fenoprofen Fenoprofen Pabalate or Pabalate-SF   Buffets II Flogesic Panagesic   Buffinol plain or Extra Strength Florinal or Florinal with Codeine Panwarfarin   Buf-Tabs Flurbiprofen  Penicillamine   Butalbital Compound Four-way cold tablets Penicillin   Butazolidin Fragmin Pepto-Bismol   Carbenicillin Geminisyn Percodan   Carna Arthritis Reliever Geopen Persantine   Carprofen Gold's salt Persistin   Chloramphenicol Goody's Phenylbutazone   Chloromycetin Haltrain Piroxlcam   Clmetidine heparin Plaquenil   Cllnoril Hyco-pap Ponstel   Clofibrate Hydroxy chloroquine Propoxyphen         Before stopping any of these medications, be sure to consult the physician who ordered them.  Some, such as Coumadin (Warfarin) are ordered to prevent or treat serious conditions such  as "deep thrombosis", "pumonary embolisms", and other heart problems.  The amount of time that you may need off of the medication may also vary with the medication and the reason for which you were taking it.  If you are taking any of these medications, please make sure you notify your pain physician before you undergo any procedures.         Pain Management Discharge Instructions  General Discharge Instructions :  If you need to reach your doctor call: Monday-Friday 8:00 am - 4:00 pm at [phone] or toll free [phone].  After clinic hours [phone] to have operator reach doctor.  Bring all of your medication bottles to all your appointments in the pain clinic.  To cancel or reschedule your appointment with Pain Management please remember to call 24 hours in advance to avoid a fee.  Refer to the educational materials which you have been given on: General Risks, I had my Procedure. Discharge Instructions, Post Sedation.  Post Procedure Instructions:  The drugs you were given will stay in your system until tomorrow, so for the next 24 hours you should not drive, make any legal decisions or drink any alcoholic beverages.  You may eat anything you prefer, but it is better to start with liquids then soups and crackers, and gradually work up to solid foods.  Please notify your doctor  immediately if you have any unusual bleeding, trouble breathing or pain that is not related to your normal pain.  Depending on the type of procedure that was done, some parts of your body may feel week and/or numb.  This usually clears up by tonight or the next day.  Walk with the use of an assistive device or accompanied by an adult for the 24 hours.  You may use ice on the affected area for the first 24 hours.  Put ice in a Ziploc bag and cover with a towel and place against area 15 minutes on 15 minutes off.  You may switch to heat after 24 hours.Tramadol tablets What is this medicine? TRAMADOL (TRA ma dole) is a pain reliever. It is used to treat moderate to severe pain in adults. This medicine may be used for other purposes; ask your health care provider or pharmacist if you have questions. COMMON BRAND NAME(S): Ultram What should I tell my health care provider before I take this medicine? They need to know if you have any of these conditions: -brain tumor -depression -drug abuse or addiction -head injury -if you frequently drink alcohol containing drinks -kidney disease or trouble passing urine -liver disease -lung disease, asthma, or breathing problems -seizures or epilepsy -suicidal thoughts, plans, or attempt; a previous suicide attempt by you or a family member -an unusual or allergic reaction to tramadol, codeine, other medicines, foods, dyes, or preservatives -pregnant or trying to get pregnant -breast-feeding How should I use this medicine? Take this medicine by mouth with a full glass of water. Follow the directions on the prescription label. If the medicine upsets your stomach, take it with food or milk. Do not take more medicine than you are told to take. Talk to your pediatrician regarding the use of this medicine in children. Special care may be needed. Overdosage: If you think you have taken too much of this medicine contact a poison control center or emergency room at  once. NOTE: This medicine is only for you. Do not share this medicine with others. What if I miss a dose? If you miss a dose, take  it as soon as you can. If it is almost time for your next dose, take only that dose. Do not take double or extra doses. What may interact with this medicine? Do not take this medicine with any of the following medications: -MAOIs like Carbex, Eldepryl, Marplan, Nardil, and Parnate This medicine may also interact with the following medications: -alcohol or medicines that contain alcohol -antihistamines -benzodiazepines -bupropion -carbamazepine or oxcarbazepine -clozapine -cyclobenzaprine -digoxin -furazolidone -linezolid -medicines for depression, anxiety, or psychotic disturbances -medicines for migraine headache like almotriptan, eletriptan, frovatriptan, naratriptan, rizatriptan, sumatriptan, zolmitriptan -medicines for pain like pentazocine, buprenorphine, butorphanol, meperidine, nalbuphine, and propoxyphene -medicines for sleep -muscle relaxants -naltrexone -phenobarbital -phenothiazines like perphenazine, thioridazine, chlorpromazine, mesoridazine, fluphenazine, prochlorperazine, promazine, and trifluoperazine -procarbazine -warfarin This list may not describe all possible interactions. Give your health care provider a list of all the medicines, herbs, non-prescription drugs, or dietary supplements you use. Also tell them if you smoke, drink alcohol, or use illegal drugs. Some items may interact with your medicine. What should I watch for while using this medicine? Tell your doctor or health care professional if your pain does not go away, if it gets worse, or if you have new or a different type of pain. You may develop tolerance to the medicine. Tolerance means that you will need a higher dose of the medicine for pain relief. Tolerance is normal and is expected if you take this medicine for a long time. Do not suddenly stop taking your medicine  because you may develop a severe reaction. Your body becomes used to the medicine. This does NOT mean you are addicted. Addiction is a behavior related to getting and using a drug for a non-medical reason. If you have pain, you have a medical reason to take pain medicine. Your doctor will tell you how much medicine to take. If your doctor wants you to stop the medicine, the dose will be slowly lowered over time to avoid any side effects. You may get drowsy or dizzy. Do not drive, use machinery, or do anything that needs mental alertness until you know how this medicine affects you. Do not stand or sit up quickly, especially if you are an older patient. This reduces the risk of dizzy or fainting spells. Alcohol can increase or decrease the effects of this medicine. Avoid alcoholic drinks. You may have constipation. Try to have a bowel movement at least every 2 to 3 days. If you do not have a bowel movement for 3 days, call your doctor or health care professional. Your mouth may get dry. Chewing sugarless gum or sucking hard candy, and drinking plenty of water may help. Contact your doctor if the problem does not go away or is severe. What side effects may I notice from receiving this medicine? Side effects that you should report to your doctor or health care professional as soon as possible: -allergic reactions like skin rash, itching or hives, swelling of the face, lips, or tongue -breathing difficulties, wheezing -confusion -itching -light headedness or fainting spells -redness, blistering, peeling or loosening of the skin, including inside the mouth -seizures Side effects that usually do not require medical attention (report to your doctor or health care professional if they continue or are bothersome): -constipation -dizziness -drowsiness -headache -nausea, vomiting This list may not describe all possible side effects. Call your doctor for medical advice about side effects. You may report side  effects to FDA at 1-800-FDA-1088. Where should I keep my medicine? Keep out of the reach of  children. Store at room temperature between 15 and 30 degrees C (59 and 86 degrees F). Keep container tightly closed. Throw away any unused medicine after the expiration date. NOTE: This sheet is a summary. It may not cover all possible information. If you have questions about this medicine, talk to your doctor, pharmacist, or health care provider.  2015, [employer]. (2010-02-27 11:55:44) GENERAL RISKS AND COMPLICATIONS  What are the risk, side effects and possible complications? Generally speaking, most procedures are safe.  However, with any procedure there are risks, side effects, and the possibility of complications.  The risks and complications are dependent upon the sites that are lesioned, or the type of nerve block to be performed.  The closer the procedure is to the spine, the more serious the risks are.  Great care is taken when placing the radio frequency needles, block needles or lesioning probes, but sometimes complications can occur. 2. Infection: Any time there is an injection through the skin, there is a risk of infection.  This is why sterile conditions are used for these blocks.  There are four possible types of infection. 1. Localized skin infection. 2. Central Nervous System Infection-This can be in the form of Meningitis, which can be deadly. 3. Epidural Infections-This can be in the form of an epidural abscess, which can cause pressure inside of the spine, causing compression of the spinal cord with subsequent paralysis. This would require an emergency surgery to decompress, and there are no guarantees that the patient would recover from the paralysis. 4. Discitis-This is an infection of the intervertebral discs.  It occurs in about 1% of discography procedures.  It is difficult to treat and it may lead to surgery.        2. Pain: the needles have to go through skin and soft  tissues, will cause soreness.       3. Damage to internal structures:  The nerves to be lesioned may be near blood vessels or    other nerves which can be potentially damaged.       4. Bleeding: Bleeding is more common if the patient is taking blood thinners such as  aspirin, Coumadin, Ticiid, Plavix, etc., or if he/she have some genetic predisposition  such as hemophilia. Bleeding into the spinal canal can cause compression of the spinal  cord with subsequent paralysis.  This would require an emergency surgery to  decompress and there are no guarantees that the patient would recover from the  paralysis.       5. Pneumothorax:  Puncturing of a lung is a possibility, every time a needle is introduced in  the area of the chest or upper back.  Pneumothorax refers to free air around the  collapsed lung(s), inside of the thoracic cavity (chest cavity).  Another two possible  complications related to a similar event would include: Hemothorax and Chylothorax.   These are variations of the Pneumothorax, where instead of air around the collapsed  lung(s), you may have blood or chyle, respectively.       6. Spinal headaches: They may occur with any procedures in the area of the spine.       7. Persistent CSF (Cerebro-Spinal Fluid) leakage: This is a rare problem, but may occur  with prolonged intrathecal or epidural catheters either due to the formation of a fistulous  track or a dural tear.       8. Nerve damage: By working so close to the spinal cord, there is always a  possibility of  nerve damage, which could be as serious as a permanent spinal cord injury with  paralysis.       9. Death:  Although rare, severe deadly allergic reactions known as "Anaphylactic  reaction" can occur to any of the medications used.      10. Worsening of the symptoms:  We can always make thing worse.  What are the chances of something like this happening? Chances of any of this occuring are extremely low.  By statistics, you have  more of a chance of getting killed in a motor vehicle accident: while driving to the hospital than any of the above occurring .  Nevertheless, you should be aware that they are possibilities.  In general, it is similar to taking a shower.  Everybody knows that you can slip, hit your head and get killed.  Does that mean that you should not shower again?  Nevertheless always keep in mind that statistics do not mean anything if you happen to be on the wrong side of them.  Even if a procedure has a 1 (one) in a 1,000,000 (million) chance of going wrong, it you happen to be that one..Also, keep in mind that by statistics, you have more of a chance of having something go wrong when taking medications.  Who should not have this procedure? If you are on a blood thinning medication (e.g. Coumadin, Plavix, see list of "Blood Thinners"), or if you have an active infection going on, you should not have the procedure.  If you are taking any blood thinners, please inform your physician.  How should I prepare for this procedure?  Do not eat or drink anything at least six hours prior to the procedure.  Bring a driver with you .  It cannot be a taxi.  Come accompanied by an adult that can drive you back, and that is strong enough to help you if your legs get weak or numb from the local anesthetic.  Take all of your medicines the morning of the procedure with just enough water to swallow them.  If you have diabetes, make sure that you are scheduled to have your procedure done first thing in the morning, whenever possible.  If you have diabetes, take only half of your insulin dose and notify our nurse that you have done so as soon as you arrive at the clinic.  If you are diabetic, but only take blood sugar pills (oral hypoglycemic), then do not take them on the morning of your procedure.  You may take them after you have had the procedure.  Do not take aspirin or any aspirin-containing medications, at least eleven  (11) days prior to the procedure.  They may prolong bleeding.  Wear loose fitting clothing that may be easy to take off and that you would not mind if it got stained with Betadine or blood.  Do not wear any jewelry or perfume  Remove any nail coloring.  It will interfere with some of our monitoring equipment.  NOTE: Remember that this is not meant to be interpreted as a complete list of all possible complications.  Unforeseen problems may occur.  BLOOD THINNERS The following drugs contain aspirin or other products, which can cause increased bleeding during surgery and should not be taken for 2 weeks prior to and 1 week after surgery.  If you should need take something for relief of minor pain, you may take acetaminophen which is found in Tylenol,m Datril, Anacin-3 and Panadol. It  is not blood thinner. The products listed below are.  Do not take any of the products listed below in addition to any listed on your instruction sheet.  A.P.C or A.P.C with Codeine Codeine Phosphate Capsules #3 Ibuprofen Ridaura  ABC compound Congesprin Imuran rimadil  Advil Cope Indocin Robaxisal  Alka-Seltzer Effervescent Pain Reliever and Antacid Coricidin or Coricidin-D  Indomethacin Rufen  Alka-Seltzer plus Cold Medicine Cosprin Ketoprofen S-A-C Tablets  Anacin Analgesic Tablets or Capsules Coumadin Korlgesic Salflex  Anacin Extra Strength Analgesic tablets or capsules CP-2 Tablets Lanoril Salicylate  Anaprox Cuprimine Capsules Levenox Salocol  Anexsia-D Dalteparin Magan Salsalate  Anodynos Darvon compound Magnesium Salicylate Sine-off  Ansaid Dasin Capsules Magsal Sodium Salicylate  Anturane Depen Capsules Marnal Soma  APF Arthritis pain formula Dewitt's Pills Measurin Stanback  Argesic Dia-Gesic Meclofenamic Sulfinpyrazone  Arthritis Bayer Timed Release Aspirin Diclofenac Meclomen Sulindac  Arthritis pain formula Anacin Dicumarol Medipren Supac  Analgesic (Safety coated) Arthralgen Diffunasal Mefanamic  Suprofen  Arthritis Strength Bufferin Dihydrocodeine Mepro Compound Suprol  Arthropan liquid Dopirydamole Methcarbomol with Aspirin Synalgos  ASA tablets/Enseals Disalcid Micrainin Tagament  Ascriptin Doan's Midol Talwin  Ascriptin A/D Dolene Mobidin Tanderil  Ascriptin Extra Strength Dolobid Moblgesic Ticlid  Ascriptin with Codeine Doloprin or Doloprin with Codeine Momentum Tolectin  Asperbuf Duoprin Mono-gesic Trendar  Aspergum Duradyne Motrin or Motrin IB Triminicin  Aspirin plain, buffered or enteric coated Durasal Myochrisine Trigesic  Aspirin Suppositories Easprin Nalfon Trillsate  Aspirin with Codeine Ecotrin Regular or Extra Strength Naprosyn Uracel  Atromid-S Efficin Naproxen Ursinus  Auranofin Capsules Elmiron Neocylate Vanquish  Axotal Emagrin Norgesic Verin  Azathioprine Empirin or Empirin with Codeine Normiflo Vitamin E  Azolid Emprazil Nuprin Voltaren  Bayer Aspirin plain, buffered or children's or timed BC Tablets or powders Encaprin Orgaran Warfarin Sodium  Buff-a-Comp Enoxaparin Orudis Zorpin  Buff-a-Comp with Codeine Equegesic Os-Cal-Gesic   Buffaprin Excedrin plain, buffered or Extra Strength Oxalid   Bufferin Arthritis Strength Feldene Oxphenbutazone   Bufferin plain or Extra Strength Feldene Capsules Oxycodone with Aspirin   Bufferin with Codeine Fenoprofen Fenoprofen Pabalate or Pabalate-SF   Buffets II Flogesic Panagesic   Buffinol plain or Extra Strength Florinal or Florinal with Codeine Panwarfarin   Buf-Tabs Flurbiprofen Penicillamine   Butalbital Compound Four-way cold tablets Penicillin   Butazolidin Fragmin Pepto-Bismol   Carbenicillin Geminisyn Percodan   Carna Arthritis Reliever Geopen Persantine   Carprofen Gold's salt Persistin   Chloramphenicol Goody's Phenylbutazone   Chloromycetin Haltrain Piroxlcam   Clmetidine heparin Plaquenil   Cllnoril Hyco-pap Ponstel   Clofibrate Hydroxy chloroquine Propoxyphen         Before stopping any of these  medications, be sure to consult the physician who ordered them.  Some, such as Coumadin (Warfarin) are ordered to prevent or treat serious conditions such as "deep thrombosis", "pumonary embolisms", and other heart problems.  The amount of time that you may need off of the medication may also vary with the medication and the reason for which you were taking it.  If you are taking any of these medications, please make sure you notify your pain physician before you undergo any procedures.         Knee Injection Joint injections are shots. Your caregiver will place a needle into your knee joint. The needle is used to put medicine into the joint. These shots can be used to help treat different painful knee conditions such as osteoarthritis, bursitis, local flare-ups of rheumatoid arthritis, and pseudogout. Anti-inflammatory medicines such as corticosteroids and anesthetics are the most  common medicines used for joint and soft tissue injections.  PROCEDURE 5. The skin over the kneecap will be cleaned with an antiseptic solution. 6. Your caregiver will inject a small amount of a local anesthetic (a medicine like Novocaine) just under the skin in the area that was cleaned. 7. After the area becomes numb, a second injection is done. This second injection usually includes an anesthetic and an anti-inflammatory medicine called a steroid or cortisone. The needle is carefully placed in between the kneecap and the knee, and the medicine is injected into the joint space. 8. After the injection is done, the needle is removed. Your caregiver may place a bandage over the injection site. The whole procedure takes no more than a couple of minutes. BEFORE THE PROCEDURE  Wash all of the skin around the entire knee area. Try to remove any loose, scaling skin. There is no other specific preparation necessary unless advised otherwise by your caregiver. LET YOUR CAREGIVER KNOW ABOUT:   Allergies.  Medications taken  including herbs, eye drops, over the counter medications, and creams.  Use of steroids (by mouth or creams).  Possible pregnancy, if applicable.  Previous problems with anesthetics or Novocaine.  History of blood clots (thrombophlebitis).  History of bleeding or blood problems.  Previous surgery.  Other health problems. RISKS AND COMPLICATIONS Side effects from cortisone shots are rare. They include:   Slight bruising of the skin.  Shrinkage of the normal fatty tissue under the skin where the shot was given.  Increase in pain after the shot.  Infection.  Weakening of tendons or tendon rupture.  Allergic reaction to the medicine.  Diabetics may have a temporary increase in their blood sugar after a shot.  Cortisone can temporarily weaken the immune system. While receiving these shots, you should not get certain vaccines. Also, avoid contact with anyone who has chickenpox or measles. Especially if you have never had these diseases or have not been previously immunized. Your immune system may not be strong enough to fight off the infection while the cortisone is in your system. AFTER THE PROCEDURE   You can go home after the procedure.  You may need to put ice on the joint 15-20 minutes every 3 or 4 hours until the pain goes away.  You may need to put an elastic bandage on the joint. HOME CARE INSTRUCTIONS  23. Only take over-the-counter or prescription medicines for pain, discomfort, or fever as directed by your caregiver. 24. You should avoid stressing the joint. Unless advised otherwise, avoid activities that put a lot of pressure on a knee joint, such as: 1. Jogging. 2. Bicycling. 3. Recreational climbing. 4. Hiking. 25. Laying down and elevating the leg/knee above the level of your heart can help to minimize swelling. SEEK MEDICAL CARE IF:   You have repeated or worsening swelling.  There is drainage from the puncture area.  You develop red streaking that  extends above or below the site where the needle was inserted. SEEK IMMEDIATE MEDICAL CARE IF:   You develop a fever.  You have pain that gets worse even though you are taking pain medicine.  The area is red and warm, and you have trouble moving the joint. MAKE SURE YOU:   Understand these instructions.  Will watch your condition.  Will get help right away if you are not doing well or get worse. Document Released: 09/07/2006 Document Revised: 09/08/2011 Document Reviewed: 06/04/2007 [facility] Patient Information 2015 [place], [place]. This information is not intended  to replace advice given to you by your health care provider. Make sure you discuss any questions you have with your health care provider.  

## 2015-03-20 NOTE — Progress Notes (Signed)
Safety precautions to be maintained throughout the outpatient stay will include: orient to surroundings, keep bed in low position, maintain call bell within reach at all times, provide assistance with transfer out of bed and ambulation.  

## 2015-03-23 ENCOUNTER — Encounter: Payer: Self-pay | Admitting: Emergency Medicine

## 2015-03-23 ENCOUNTER — Emergency Department: Payer: Medicare Other

## 2015-03-23 ENCOUNTER — Observation Stay
Admission: EM | Admit: 2015-03-23 | Discharge: 2015-03-27 | Disposition: A | Discharge status: Home / Self Care | Payer: Medicare Other | Attending: Internal Medicine | Admitting: Internal Medicine

## 2015-03-23 DIAGNOSIS — Z85830 Personal history of malignant neoplasm of bone: Secondary | ICD-10-CM | POA: Diagnosis not present

## 2015-03-23 DIAGNOSIS — T380X5A Adverse effect of glucocorticoids and synthetic analogues, initial encounter: Secondary | ICD-10-CM | POA: Diagnosis not present

## 2015-03-23 DIAGNOSIS — F419 Anxiety disorder, unspecified: Secondary | ICD-10-CM | POA: Diagnosis not present

## 2015-03-23 DIAGNOSIS — C3492 Malignant neoplasm of unspecified part of left bronchus or lung: Secondary | ICD-10-CM

## 2015-03-23 DIAGNOSIS — Z880 Allergy status to penicillin: Secondary | ICD-10-CM | POA: Insufficient documentation

## 2015-03-23 DIAGNOSIS — Z85118 Personal history of other malignant neoplasm of bronchus and lung: Secondary | ICD-10-CM | POA: Diagnosis not present

## 2015-03-23 DIAGNOSIS — I509 Heart failure, unspecified: Secondary | ICD-10-CM | POA: Diagnosis not present

## 2015-03-23 DIAGNOSIS — Z90710 Acquired absence of both cervix and uterus: Secondary | ICD-10-CM | POA: Diagnosis not present

## 2015-03-23 DIAGNOSIS — Z87891 Personal history of nicotine dependence: Secondary | ICD-10-CM | POA: Diagnosis not present

## 2015-03-23 DIAGNOSIS — Z794 Long term (current) use of insulin: Secondary | ICD-10-CM | POA: Diagnosis not present

## 2015-03-23 DIAGNOSIS — K219 Gastro-esophageal reflux disease without esophagitis: Secondary | ICD-10-CM | POA: Diagnosis not present

## 2015-03-23 DIAGNOSIS — G4730 Sleep apnea, unspecified: Secondary | ICD-10-CM | POA: Diagnosis not present

## 2015-03-23 DIAGNOSIS — Z66 Do not resuscitate: Secondary | ICD-10-CM | POA: Diagnosis not present

## 2015-03-23 DIAGNOSIS — N189 Chronic kidney disease, unspecified: Secondary | ICD-10-CM | POA: Diagnosis not present

## 2015-03-23 DIAGNOSIS — E1165 Type 2 diabetes mellitus with hyperglycemia: Secondary | ICD-10-CM | POA: Insufficient documentation

## 2015-03-23 DIAGNOSIS — E1142 Type 2 diabetes mellitus with diabetic polyneuropathy: Secondary | ICD-10-CM | POA: Insufficient documentation

## 2015-03-23 DIAGNOSIS — J9621 Acute and chronic respiratory failure with hypoxia: Secondary | ICD-10-CM | POA: Diagnosis not present

## 2015-03-23 DIAGNOSIS — R05 Cough: Secondary | ICD-10-CM | POA: Diagnosis not present

## 2015-03-23 DIAGNOSIS — J441 Chronic obstructive pulmonary disease with (acute) exacerbation: Secondary | ICD-10-CM | POA: Diagnosis present

## 2015-03-23 DIAGNOSIS — Z8541 Personal history of malignant neoplasm of cervix uteri: Secondary | ICD-10-CM | POA: Diagnosis not present

## 2015-03-23 DIAGNOSIS — F329 Major depressive disorder, single episode, unspecified: Secondary | ICD-10-CM | POA: Diagnosis not present

## 2015-03-23 DIAGNOSIS — Z7952 Long term (current) use of systemic steroids: Secondary | ICD-10-CM | POA: Diagnosis not present

## 2015-03-23 DIAGNOSIS — I129 Hypertensive chronic kidney disease with stage 1 through stage 4 chronic kidney disease, or unspecified chronic kidney disease: Secondary | ICD-10-CM | POA: Insufficient documentation

## 2015-03-23 DIAGNOSIS — Z8505 Personal history of malignant neoplasm of liver: Secondary | ICD-10-CM | POA: Diagnosis not present

## 2015-03-23 DIAGNOSIS — R918 Other nonspecific abnormal finding of lung field: Secondary | ICD-10-CM | POA: Diagnosis not present

## 2015-03-23 DIAGNOSIS — Z7951 Long term (current) use of inhaled steroids: Secondary | ICD-10-CM | POA: Diagnosis not present

## 2015-03-23 DIAGNOSIS — Z853 Personal history of malignant neoplasm of breast: Secondary | ICD-10-CM | POA: Insufficient documentation

## 2015-03-23 DIAGNOSIS — Z9889 Other specified postprocedural states: Secondary | ICD-10-CM | POA: Insufficient documentation

## 2015-03-23 DIAGNOSIS — R911 Solitary pulmonary nodule: Secondary | ICD-10-CM

## 2015-03-23 DIAGNOSIS — Z79899 Other long term (current) drug therapy: Secondary | ICD-10-CM | POA: Diagnosis not present

## 2015-03-23 DIAGNOSIS — E1122 Type 2 diabetes mellitus with diabetic chronic kidney disease: Secondary | ICD-10-CM | POA: Insufficient documentation

## 2015-03-23 DIAGNOSIS — R590 Localized enlarged lymph nodes: Secondary | ICD-10-CM | POA: Diagnosis not present

## 2015-03-23 HISTORY — DX: Heart failure, unspecified: I50.9

## 2015-03-23 HISTORY — DX: Sleep apnea, unspecified: G47.30

## 2015-03-23 MED ORDER — ALBUTEROL SULFATE (2.5 MG/3ML) 0.083% IN NEBU
5.0000 mg | INHALATION_SOLUTION | Freq: Once | RESPIRATORY_TRACT | Status: AC
  Administered 2015-03-24: 5 mg via RESPIRATORY_TRACT
  Filled 2015-03-23: qty 6

## 2015-03-23 NOTE — ED Notes (Signed)
Pt here from home via [place], reports shortness of breath upon exertion for 1 week; EMS reports pt sat 97% on room air; 99% on 2L; pt reports taking SVN tx upon arrival. EMS reports CBG 231; pt is CHF patient, unsure of weigh gain but reports she's retaining fluid in abdomen.

## 2015-03-24 ENCOUNTER — Emergency Department: Payer: Medicare Other

## 2015-03-24 DIAGNOSIS — G4730 Sleep apnea, unspecified: Secondary | ICD-10-CM

## 2015-03-24 DIAGNOSIS — R918 Other nonspecific abnormal finding of lung field: Secondary | ICD-10-CM | POA: Diagnosis not present

## 2015-03-24 DIAGNOSIS — R0602 Shortness of breath: Secondary | ICD-10-CM

## 2015-03-24 DIAGNOSIS — Z853 Personal history of malignant neoplasm of breast: Secondary | ICD-10-CM | POA: Diagnosis not present

## 2015-03-24 DIAGNOSIS — Z9011 Acquired absence of right breast and nipple: Secondary | ICD-10-CM

## 2015-03-24 DIAGNOSIS — Z9221 Personal history of antineoplastic chemotherapy: Secondary | ICD-10-CM

## 2015-03-24 DIAGNOSIS — Z87891 Personal history of nicotine dependence: Secondary | ICD-10-CM

## 2015-03-24 DIAGNOSIS — Z794 Long term (current) use of insulin: Secondary | ICD-10-CM

## 2015-03-24 DIAGNOSIS — E119 Type 2 diabetes mellitus without complications: Secondary | ICD-10-CM

## 2015-03-24 DIAGNOSIS — I129 Hypertensive chronic kidney disease with stage 1 through stage 4 chronic kidney disease, or unspecified chronic kidney disease: Secondary | ICD-10-CM

## 2015-03-24 DIAGNOSIS — K219 Gastro-esophageal reflux disease without esophagitis: Secondary | ICD-10-CM

## 2015-03-24 DIAGNOSIS — N189 Chronic kidney disease, unspecified: Secondary | ICD-10-CM

## 2015-03-24 LAB — GLUCOSE, CAPILLARY
Glucose-Capillary: 365 mg/dL — ABNORMAL HIGH (ref 65–99)
Glucose-Capillary: 476 mg/dL — ABNORMAL HIGH (ref 65–99)
Glucose-Capillary: 489 mg/dL — ABNORMAL HIGH (ref 65–99)
Glucose-Capillary: 328 mg/dL — ABNORMAL HIGH (ref 65–99)
Glucose-Capillary: 133 mg/dL — ABNORMAL HIGH (ref 65–99)
Glucose-Capillary: 242 mg/dL — ABNORMAL HIGH (ref 65–99)

## 2015-03-24 LAB — CBC WITH DIFFERENTIAL/PLATELET
BASOS ABS: 0.1 10*3/uL (ref 0–0.1)
Basophils Relative: 1 %
EOS PCT: 3 %
Eosinophils Absolute: 0.3 10*3/uL (ref 0–0.7)
HEMATOCRIT: 32.8 % — AB (ref 35.0–47.0)
Hemoglobin: 10.5 g/dL — ABNORMAL LOW (ref 12.0–16.0)
LYMPHS ABS: 2.1 10*3/uL (ref 1.0–3.6)
LYMPHS PCT: 25 %
MCH: 28.2 pg (ref 26.0–34.0)
MCHC: 32.1 g/dL (ref 32.0–36.0)
MCV: 87.8 fL (ref 80.0–100.0)
MONO ABS: 0.7 10*3/uL (ref 0.2–0.9)
Monocytes Relative: 8 %
NEUTROS ABS: 5.3 10*3/uL (ref 1.4–6.5)
Neutrophils Relative %: 63 %
PLATELETS: 246 10*3/uL (ref 150–440)
RBC: 3.74 MIL/uL — AB (ref 3.80–5.20)
RDW: 14 % (ref 11.5–14.5)
WBC: 8.5 10*3/uL (ref 3.6–11.0)

## 2015-03-24 LAB — BASIC METABOLIC PANEL
ANION GAP: 7 (ref 5–15)
BUN: 14 mg/dL (ref 6–20)
CHLORIDE: 104 mmol/L (ref 101–111)
CO2: 27 mmol/L (ref 22–32)
Calcium: 8.9 mg/dL (ref 8.9–10.3)
Creatinine, Ser: 0.89 mg/dL (ref 0.44–1.00)
GFR calc Af Amer: >60 mL/min (ref >60)
GFR calc non Af Amer: >60 mL/min (ref >60)
GLUCOSE: 224 mg/dL — AB (ref 65–99)
POTASSIUM: 3.6 mmol/L (ref 3.5–5.1)
Sodium: 138 mmol/L (ref 135–145)

## 2015-03-24 LAB — HEMOGLOBIN A1C: Hgb A1c MFr Bld: 8.5 % — ABNORMAL HIGH (ref 4.0–6.0)

## 2015-03-24 LAB — TROPONIN I
Troponin I: <0.03 ng/mL (ref <0.031)
Troponin I: <0.03 ng/mL (ref <0.031)

## 2015-03-24 MED ORDER — IBUPROFEN 600 MG PO TABS
ORAL_TABLET | ORAL | Status: AC
  Administered 2015-03-24: 600 mg via ORAL
  Filled 2015-03-24: qty 1

## 2015-03-24 MED ORDER — ACETAMINOPHEN 325 MG PO TABS
650.0000 mg | ORAL_TABLET | Freq: Four times a day (QID) | ORAL | Status: DC | PRN
  Administered 2015-03-24: 20:00:00 650 mg via ORAL
  Filled 2015-03-24: qty 2

## 2015-03-24 MED ORDER — INSULIN ASPART 100 UNIT/ML ~~LOC~~ SOLN: 8.0000 [IU] | Freq: Three times a day (TID) | SUBCUTANEOUS | Status: DC

## 2015-03-24 MED ORDER — ALBUTEROL SULFATE (2.5 MG/3ML) 0.083% IN NEBU
2.5000 mg | INHALATION_SOLUTION | RESPIRATORY_TRACT | Status: DC | PRN
  Administered 2015-03-26: 2.5 mg via RESPIRATORY_TRACT
  Filled 2015-03-24: qty 3

## 2015-03-24 MED ORDER — GABAPENTIN 300 MG PO CAPS
300.0000 mg | ORAL_CAPSULE | Freq: Every day | ORAL | Status: DC
  Administered 2015-03-24 – 2015-03-26 (×3): 300 mg via ORAL
  Filled 2015-03-24 (×3): qty 1

## 2015-03-24 MED ORDER — METHYLPREDNISOLONE SODIUM SUCC 40 MG IJ SOLR
40.0000 mg | Freq: Two times a day (BID) | INTRAMUSCULAR | Status: DC
  Administered 2015-03-24 – 2015-03-26 (×4): 40 mg via INTRAVENOUS
  Filled 2015-03-24 (×4): qty 1

## 2015-03-24 MED ORDER — IBUPROFEN 600 MG PO TABS
600.0000 mg | ORAL_TABLET | Freq: Once | ORAL | Status: AC
  Administered 2015-03-24: 600 mg via ORAL

## 2015-03-24 MED ORDER — IOHEXOL 300 MG/ML  SOLN
75.0000 mL | Freq: Once | INTRAMUSCULAR | Status: AC | PRN
Start: 2015-03-24 — End: 2015-03-24
  Administered 2015-03-24: 75 mL via INTRAVENOUS

## 2015-03-24 MED ORDER — IPRATROPIUM-ALBUTEROL 0.5-2.5 (3) MG/3ML IN SOLN
3.0000 mL | Freq: Once | RESPIRATORY_TRACT | Status: AC
  Administered 2015-03-24: 3 mL via RESPIRATORY_TRACT
  Filled 2015-03-24: qty 3

## 2015-03-24 MED ORDER — INSULIN DETEMIR 100 UNIT/ML ~~LOC~~ SOLN
35.0000 [IU] | Freq: Every day | SUBCUTANEOUS | Status: DC
  Administered 2015-03-24 – 2015-03-26 (×3): 35 [IU] via SUBCUTANEOUS
  Filled 2015-03-24 (×4): qty 0.35

## 2015-03-24 MED ORDER — INSULIN ASPART 100 UNIT/ML ~~LOC~~ SOLN
16.0000 [IU] | Freq: Once | SUBCUTANEOUS | Status: AC
  Administered 2015-03-24: 11:00:00 16 [IU] via SUBCUTANEOUS
  Filled 2015-03-24: qty 16

## 2015-03-24 MED ORDER — INSULIN ASPART 100 UNIT/ML ~~LOC~~ SOLN
12.0000 [IU] | Freq: Once | SUBCUTANEOUS | Status: AC
  Administered 2015-03-24: 14:00:00 12 [IU] via SUBCUTANEOUS
  Filled 2015-03-24: qty 12

## 2015-03-24 MED ORDER — METOPROLOL TARTRATE 25 MG PO TABS
25.0000 mg | ORAL_TABLET | Freq: Two times a day (BID) | ORAL | Status: DC
  Administered 2015-03-24 – 2015-03-27 (×7): 25 mg via ORAL
  Filled 2015-03-24 (×7): qty 1

## 2015-03-24 MED ORDER — ATORVASTATIN CALCIUM 20 MG PO TABS
40.0000 mg | ORAL_TABLET | Freq: Every day | ORAL | Status: DC
  Administered 2015-03-24 – 2015-03-27 (×4): 40 mg via ORAL
  Filled 2015-03-24 (×4): qty 2

## 2015-03-24 MED ORDER — POLYVINYL ALCOHOL 1.4 % OP SOLN: 1.0000 [drp] | OPHTHALMIC | Status: DC | PRN

## 2015-03-24 MED ORDER — DOXYLAMINE-DM 6.25-15 MG/15ML PO LIQD: 1.0000 | Freq: Four times a day (QID) | ORAL | Status: DC | PRN

## 2015-03-24 MED ORDER — INSULIN ASPART 100 UNIT/ML ~~LOC~~ SOLN
0.0000 [IU] | Freq: Three times a day (TID) | SUBCUTANEOUS | Status: DC
  Administered 2015-03-24: 2 [IU] via SUBCUTANEOUS
  Administered 2015-03-25: 5 [IU] via SUBCUTANEOUS
  Administered 2015-03-25: 17:00:00 8 [IU] via SUBCUTANEOUS
  Administered 2015-03-25 – 2015-03-26 (×2): 3 [IU] via SUBCUTANEOUS
  Administered 2015-03-26: 8 [IU] via SUBCUTANEOUS
  Administered 2015-03-26 – 2015-03-27 (×2): 2 [IU] via SUBCUTANEOUS
  Filled 2015-03-24 (×2): qty 2
  Filled 2015-03-24: qty 3
  Filled 2015-03-24: qty 8
  Filled 2015-03-24: qty 2
  Filled 2015-03-24: qty 8
  Filled 2015-03-24: qty 1
  Filled 2015-03-24: qty 5

## 2015-03-24 MED ORDER — PANTOPRAZOLE SODIUM 40 MG PO TBEC
40.0000 mg | DELAYED_RELEASE_TABLET | Freq: Every day | ORAL | Status: DC
  Administered 2015-03-24 – 2015-03-27 (×4): 40 mg via ORAL
  Filled 2015-03-24 (×5): qty 1

## 2015-03-24 MED ORDER — DOCUSATE SODIUM 100 MG PO CAPS
100.0000 mg | ORAL_CAPSULE | Freq: Two times a day (BID) | ORAL | Status: DC
  Administered 2015-03-24 – 2015-03-27 (×6): 100 mg via ORAL
  Filled 2015-03-24 (×6): qty 1

## 2015-03-24 MED ORDER — BUDESONIDE-FORMOTEROL FUMARATE 160-4.5 MCG/ACT IN AERO
2.0000 | INHALATION_SPRAY | Freq: Two times a day (BID) | RESPIRATORY_TRACT | Status: DC
  Administered 2015-03-24 – 2015-03-27 (×7): 2 via RESPIRATORY_TRACT
  Filled 2015-03-24: qty 6

## 2015-03-24 MED ORDER — TRAZODONE HCL 50 MG PO TABS
50.0000 mg | ORAL_TABLET | Freq: Every day | ORAL | Status: DC
Start: 2015-03-24 — End: 2015-03-27
  Administered 2015-03-24 – 2015-03-26 (×3): 50 mg via ORAL
  Filled 2015-03-24 (×3): qty 1

## 2015-03-24 MED ORDER — LORAZEPAM 1 MG PO TABS
1.0000 mg | ORAL_TABLET | Freq: Three times a day (TID) | ORAL | Status: DC | PRN
  Administered 2015-03-24 – 2015-03-27 (×4): 1 mg via ORAL
  Filled 2015-03-24 (×4): qty 1

## 2015-03-24 MED ORDER — ISOSORBIDE MONONITRATE ER 30 MG PO TB24
30.0000 mg | ORAL_TABLET | Freq: Every day | ORAL | Status: DC
  Administered 2015-03-24 – 2015-03-27 (×4): 30 mg via ORAL
  Filled 2015-03-24 (×4): qty 1

## 2015-03-24 MED ORDER — INSULIN DETEMIR 100 UNIT/ML ~~LOC~~ SOLN
24.0000 [IU] | Freq: Every day | SUBCUTANEOUS | Status: DC
  Filled 2015-03-24: qty 0.24

## 2015-03-24 MED ORDER — TIOTROPIUM BROMIDE MONOHYDRATE 18 MCG IN CAPS
1.0000 | ORAL_CAPSULE | Freq: Every day | RESPIRATORY_TRACT | Status: DC
  Administered 2015-03-24 – 2015-03-27 (×4): 18 ug via RESPIRATORY_TRACT
  Filled 2015-03-24: qty 5

## 2015-03-24 MED ORDER — BUPROPION HCL ER (SR) 100 MG PO TB12
100.0000 mg | ORAL_TABLET | Freq: Every day | ORAL | Status: DC
  Administered 2015-03-24 – 2015-03-27 (×4): 100 mg via ORAL
  Filled 2015-03-24 (×4): qty 1

## 2015-03-24 MED ORDER — METHYLPREDNISOLONE SODIUM SUCC 125 MG IJ SOLR
125.0000 mg | Freq: Once | INTRAMUSCULAR | Status: AC
  Administered 2015-03-24: 125 mg via INTRAVENOUS
  Filled 2015-03-24: qty 2

## 2015-03-24 MED ORDER — IPRATROPIUM-ALBUTEROL 0.5-2.5 (3) MG/3ML IN SOLN
3.0000 mL | Freq: Once | RESPIRATORY_TRACT | Status: AC
  Administered 2015-03-24: 3 mL via RESPIRATORY_TRACT

## 2015-03-24 MED ORDER — HEPARIN SODIUM (PORCINE) 5000 UNIT/ML IJ SOLN
5000.0000 [IU] | Freq: Three times a day (TID) | INTRAMUSCULAR | Status: DC
  Administered 2015-03-24 – 2015-03-25 (×6): 5000 [IU] via SUBCUTANEOUS
  Filled 2015-03-24 (×6): qty 1

## 2015-03-24 MED ORDER — SODIUM CHLORIDE 0.9 % IJ SOLN
3.0000 mL | Freq: Two times a day (BID) | INTRAMUSCULAR | Status: DC
  Administered 2015-03-24 – 2015-03-27 (×6): 3 mL via INTRAVENOUS

## 2015-03-24 MED ORDER — FUROSEMIDE 40 MG PO TABS
60.0000 mg | ORAL_TABLET | Freq: Every day | ORAL | Status: DC
  Administered 2015-03-24 – 2015-03-26 (×3): 60 mg via ORAL
  Filled 2015-03-24 (×3): qty 1

## 2015-03-24 MED ORDER — FLUTICASONE PROPIONATE 50 MCG/ACT NA SUSP: 1.0000 | Freq: Every day | NASAL | Status: DC | PRN

## 2015-03-24 MED ORDER — GUAIFENESIN-DM 100-10 MG/5ML PO SYRP: 5.0000 mL | ORAL_SOLUTION | Freq: Four times a day (QID) | ORAL | Status: DC | PRN

## 2015-03-24 MED ORDER — LEVOFLOXACIN 500 MG PO TABS
500.0000 mg | ORAL_TABLET | Freq: Every day | ORAL | Status: DC
  Administered 2015-03-24 – 2015-03-27 (×4): 500 mg via ORAL
  Filled 2015-03-24 (×4): qty 1

## 2015-03-24 MED ORDER — ACETAMINOPHEN 650 MG RE SUPP: 650.0000 mg | Freq: Four times a day (QID) | RECTAL | Status: DC | PRN

## 2015-03-24 MED ORDER — INSULIN ASPART 100 UNIT/ML ~~LOC~~ SOLN
0.0000 [IU] | Freq: Once | SUBCUTANEOUS | Status: AC
  Administered 2015-03-26: 8 [IU] via SUBCUTANEOUS

## 2015-03-24 MED ORDER — ARTIFICIAL TEARS 0.1-0.3 % OP SOLN: 1.0000 [drp] | OPHTHALMIC | Status: DC | PRN

## 2015-03-24 MED ORDER — PROMETHAZINE HCL 25 MG PO TABS
25.0000 mg | ORAL_TABLET | Freq: Four times a day (QID) | ORAL | Status: DC | PRN
  Administered 2015-03-24: 20:00:00 25 mg via ORAL
  Filled 2015-03-24: qty 1

## 2015-03-24 MED ORDER — INSULIN GLARGINE 100 UNIT/ML ~~LOC~~ SOLN
15.0000 [IU] | Freq: Once | SUBCUTANEOUS | Status: AC
  Administered 2015-03-24: 15 [IU] via SUBCUTANEOUS
  Filled 2015-03-24: qty 0.15

## 2015-03-24 MED ORDER — LISINOPRIL 10 MG PO TABS
10.0000 mg | ORAL_TABLET | Freq: Every day | ORAL | Status: DC
  Administered 2015-03-24 – 2015-03-27 (×4): 10 mg via ORAL
  Filled 2015-03-24 (×4): qty 1

## 2015-03-24 MED ORDER — CITALOPRAM HYDROBROMIDE 20 MG PO TABS
40.0000 mg | ORAL_TABLET | Freq: Every morning | ORAL | Status: DC
  Administered 2015-03-24 – 2015-03-27 (×4): 40 mg via ORAL
  Filled 2015-03-24 (×4): qty 2

## 2015-03-24 NOTE — Consult Note (Signed)
[name]  Telephone:([phone]) [phone] Fax:(336) [phone]  ID: Sherry Prince OB: 04-07-1946  MR#: [id number]  LZJ#:[id number]  Patient Care Team: Theotis Burrow, MD as PCP - General (Family Medicine)  CHIEF COMPLAINT:  Chief Complaint  Patient presents with  . Shortness of Breath    INTERVAL HISTORY: Patient is a 69 year old female with history of stage IV breast cancer treated at [facility] who recently presented to the emergency room with increasing shortness of breath. Subsequent workup included CT of the chest which revealed a lung mass highly suspicious for malignancy. Currently, she continues to feel short of breath but otherwise feels well. She has no neurologic complaints. She denies any recent fevers. She denies any chest pain. She has a good appetite and denies weight loss. She denies any pain. She has no nausea, vomiting, constipation, or diarrhea. She has no urinary complaints. Patient otherwise feels well and offers no further specific complaints.  REVIEW OF SYSTEMS:   Review of Systems  Constitutional: Negative for fever and weight loss.  Respiratory: Positive for shortness of breath. Negative for cough and hemoptysis.   Cardiovascular: Negative for chest pain.  Gastrointestinal: Negative.   Musculoskeletal: Negative.   Neurological: Negative.     As per HPI. Otherwise, a complete review of systems is negatve.  PAST MEDICAL HISTORY: Past Medical History  Diagnosis Date  . Diabetes mellitus without complication   . Chronic kidney disease   . GERD (gastroesophageal reflux disease)   . Hypertension   . Ear pain   . Serum calcium elevated   . Floaters   . Sleep apnea   . Cancer   . Liver cancer   . Cervical cancer   . Bone cancer   . CHF (congestive heart failure)     PAST SURGICAL HISTORY: Past Surgical History  Procedure Laterality Date  . Abdominal hysterectomy    . Breast surgery    . Eye surgery      FAMILY HISTORY Family History   Problem Relation Age of Onset  . Cancer Father        ADVANCED DIRECTIVES:    HEALTH MAINTENANCE: Social History  Substance Use Topics  . Smoking status: Former Research scientist (life sciences)  . Smokeless tobacco: None  . Alcohol Use: No     Colonoscopy:  PAP:  Bone density:  Lipid panel:  Allergies  Allergen Reactions  . Penicillins Hives    Current Facility-Administered Medications  Medication Dose Route Frequency Provider Last Rate Last Dose  . acetaminophen (TYLENOL) tablet 650 mg  650 mg Oral Q6H PRN Harrie Foreman, MD       Or  . acetaminophen (TYLENOL) suppository 650 mg  650 mg Rectal Q6H PRN Harrie Foreman, MD      . albuterol (PROVENTIL) (2.5 MG/3ML) 0.083% nebulizer solution 2.5 mg  2.5 mg Nebulization Q2H PRN Harrie Foreman, MD      . atorvastatin (LIPITOR) tablet 40 mg  40 mg Oral Daily Harrie Foreman, MD   40 mg at 03/24/15 1050  . budesonide-formoterol (SYMBICORT) 160-4.5 MCG/ACT inhaler 2 puff  2 puff Inhalation BID Harrie Foreman, MD   2 puff at 03/24/15 0757  . buPROPion (WELLBUTRIN SR) 12 hr tablet 100 mg  100 mg Oral Daily Harrie Foreman, MD   100 mg at 03/24/15 1049  . citalopram (CELEXA) tablet 40 mg  40 mg Oral q morning - 10a Harrie Foreman, MD   40 mg at 03/24/15 1049  . docusate sodium (  COLACE) capsule 100 mg  100 mg Oral BID Harrie Foreman, MD   100 mg at 03/24/15 1049  . fluticasone (FLONASE) 50 MCG/ACT nasal spray 1 spray  1 spray Each Nare Daily PRN Harrie Foreman, MD      . furosemide (LASIX) tablet 60 mg  60 mg Oral Daily Harrie Foreman, MD   60 mg at 03/24/15 1049  . gabapentin (NEURONTIN) capsule 300 mg  300 mg Oral QHS Harrie Foreman, MD      . guaiFENesin-dextromethorphan [facility] DM) 100-10 MG/5ML syrup 5 mL  5 mL Oral Q6H PRN Harrie Foreman, MD      . heparin injection 5,000 Units  5,000 Units Subcutaneous 3 times per day Harrie Foreman, MD   5,000 Units at 03/24/15 1346  . insulin aspart (novoLOG) injection 0-15  Units  0-15 Units Subcutaneous TID WC Harrie Foreman, MD   2 Units at 03/24/15 1654  . insulin aspart (novoLOG) injection 0-5 Units  0-5 Units Subcutaneous Once Harrie Foreman, MD   0 Units at 03/24/15 0630  . insulin aspart (novoLOG) injection 8 Units  8 Units Subcutaneous TID WC Gladstone Lighter, MD   Stopped at 03/24/15 1655  . insulin detemir (LEVEMIR) injection 35 Units  35 Units Subcutaneous QHS Gladstone Lighter, MD      . ipratropium-albuterol (DUONEB) 0.5-2.5 (3) MG/3ML nebulizer solution 3 mL  3 mL Nebulization Once Lisa Roca, MD      . isosorbide mononitrate (IMDUR) 24 hr tablet 30 mg  30 mg Oral Daily Harrie Foreman, MD   30 mg at 03/24/15 1049  . levofloxacin (LEVAQUIN) tablet 500 mg  500 mg Oral Daily Harrie Foreman, MD   500 mg at 03/24/15 1050  . lisinopril (PRINIVIL,ZESTRIL) tablet 10 mg  10 mg Oral Daily Harrie Foreman, MD   10 mg at 03/24/15 1049  . LORazepam (ATIVAN) tablet 1 mg  1 mg Oral Q8H PRN Harrie Foreman, MD   1 mg at 03/24/15 1049  . methylPREDNISolone sodium succinate (SOLU-MEDROL) 40 mg/mL injection 40 mg  40 mg Intravenous Q12H Gladstone Lighter, MD   40 mg at 03/24/15 1337  . metoprolol tartrate (LOPRESSOR) tablet 25 mg  25 mg Oral BID Harrie Foreman, MD   25 mg at 03/24/15 1049  . pantoprazole (PROTONIX) EC tablet 40 mg  40 mg Oral Daily Harrie Foreman, MD   40 mg at 03/24/15 0757  . polyvinyl alcohol (LIQUIFILM TEARS) 1.4 % ophthalmic solution 1 drop  1 drop Both Eyes PRN Harrie Foreman, MD      . promethazine (PHENERGAN) tablet 25 mg  25 mg Oral Q6H PRN Harrie Foreman, MD      . sodium chloride 0.9 % injection 3 mL  3 mL Intravenous Q12H Harrie Foreman, MD   3 mL at 03/24/15 1107  . tiotropium (SPIRIVA) inhalation capsule 18 mcg  1 capsule Inhalation Daily Harrie Foreman, MD   18 mcg at 03/24/15 1107  . traZODone (DESYREL) tablet 50 mg  50 mg Oral QHS Harrie Foreman, MD        OBJECTIVE: Filed Vitals:   03/24/15 1220    BP: 123/70  Pulse: 102  Temp: 98 F (36.7 C)  Resp: 22     Body mass index is 44.1 kg/(m^2).    ECOG FS:0 - Asymptomatic  General: Well-developed, well-nourished, no acute distress. Eyes: Pink conjunctiva, anicteric sclera. HEENT: Normocephalic, moist mucous membranes,  clear oropharnyx. Lungs: Scattered wheezing. Heart: Regular rate and rhythm. No rubs, murmurs, or gallops. Abdomen: Soft, nontender, nondistended. No organomegaly noted, normoactive bowel sounds. Musculoskeletal: No edema, cyanosis, or clubbing. Neuro: Alert, answering all questions appropriately. Cranial nerves grossly intact. Skin: No rashes or petechiae noted. Psych: Normal affect. Lymphatics: No cervical, calvicular, axillary or inguinal LAD.   LAB RESULTS:  Lab Results  Component Value Date   NA 138 03/24/2015   K 3.6 03/24/2015   CL 104 03/24/2015   CO2 27 03/24/2015   GLUCOSE 224* 03/24/2015   BUN 14 03/24/2015   CREATININE 0.89 03/24/2015   CALCIUM 8.9 03/24/2015   PROT 6.5 03/24/2014   ALBUMIN 2.9* 03/24/2014   AST 17 03/24/2014   ALT 19 03/24/2014   ALKPHOS 90 03/24/2014   BILITOT 0.3 03/24/2014   GFRNONAA >60 03/24/2015   GFRAA >60 03/24/2015    Lab Results  Component Value Date   WBC 8.5 03/24/2015   NEUTROABS 5.3 03/24/2015   HGB 10.5* 03/24/2015   HCT 32.8* 03/24/2015   MCV 87.8 03/24/2015   PLT 246 03/24/2015     STUDIES: Dg Chest 2 View  03/23/2015   CLINICAL DATA:  Patient with wheezing and shortness of breath for 1 week. Nonproductive cough. History of lung cancer.  EXAM: CHEST  2 VIEW  COMPARISON:  CT chest 03/24/2014 ; chest radiograph 06/18/2014  FINDINGS: Anterior chest wall Port-A-Cath is present. Stable cardiac and mediastinal contours. Interval development of masslike opacity within the left mid lung. Remainder of the lungs are clear. No pleural effusion or pneumothorax. Mid thoracic spine degenerative changes.  IMPRESSION: Masslike opacity within the left mid lung is  nonspecific in etiology. Given history of history of cancer, recommend dedicated evaluation of the chest with contrast-enhanced CT for definitive characterization.   Electronically Signed   By: Lovey Newcomer M.D.   On: 03/23/2015 23:45   Ct Chest W Contrast  03/24/2015   CLINICAL DATA:  Shortness of breath on exertion for 1 week. History of liver cancer, cervical cancer, bone cancer. Opacity in the left lung on chest radiograph.  EXAM: CT CHEST WITH CONTRAST  TECHNIQUE: Multidetector CT imaging of the chest was performed during intravenous contrast administration.  CONTRAST:  77m OMNIPAQUE IOHEXOL 300 MG/ML  SOLN  COMPARISON:  Chest radiograph 03/23/2015.  CT chest 03/24/2014.  FINDINGS: Corresponding to the chest x-ray abnormality, there is a nodule in the left mid lung measuring 2.4 by 3 cm diameter. This is suspicious for bronchogenic carcinoma. There is left hilar lymphadenopathy measuring 3 cm diameter and subcarinal lymphadenopathy measuring up to 1.9 cm diameter. Enlarged right peritracheal and pretracheal lymph nodes are also present. Prominent lymph nodes of the thoracic inlet.  Normal heart size. Normal caliber thoracic aorta. Esophagus is decompressed. Small subpleural nodules in the right apex appear unchanged since prior study and are probably benign. No focal consolidation in the lungs. No pleural effusions. No pneumothorax.  Included portions of the upper abdominal organs demonstrate multiple diffuse low-attenuation lesions throughout the liver, each measuring up to about a cm diameter. These were not present previously and likely represent liver metastasis. No adrenal gland nodules. Degenerative changes in the spine. No destructive bone lesions appreciated.  IMPRESSION: 2.4 x 3 cm nodule in the left mid lung with left hilar and diffuse mediastinal lymphadenopathy. 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 Multiple liver lesions likely representing liver metastasis.    Electronically Signed   By: WLucienne CapersM.D.   On:  03/24/2015 03:05    ASSESSMENT: Left lung mass, highly suspicious for underlying malignancy.  PLAN:    1. History of breast cancer: Patient was diagnosed with a stage I breast cancer in 2005 which was ER/PR negative, HER-2 positive. She had a right mastectomy and chemotherapy at that time with dose dense Adriamycin and Cytoxan 4 without Herceptin. She did not have radiation. She subsequently had a recurrence in 2010 that was diagnosed from a sternal FNA. She was found to have involvement of her right chest wall and pectoralis muscle as well as mediastinal lymphadenopathy, lung nodules, bone, and liver lesions. She was again given chemotherapy with Taxol, Herceptin, and Zometa between February 2010 and September 2010 with a partial response.  Taxol was discontinued then she received Herceptin and Zometa from September 2010 through November and 2012. Herceptin was subsequently discontinued secondary to worsening EF. She has been maintained on Xgeva approximately every 2 months since that time. We will need to obtain more records from [facility] for further information regarding her breast cancer treatments. Patient's most recent CA-27-29 on December 07, 2014 was 20.4, will repeat today. 2. Lung mass: Highly suspicious for possible lung primary, but given her breast cancer history this is unclear. Ultimately, patient will require biopsy to confirm diagnosis. Although lesion looks amenable to bronchoscopy, patient states she cannot have anesthesia given her poor cardiac function. Consider CT-guided biopsy.  Patient has also stated she wishes to transfer her care to [facility] given the difficulty of travel back and forth to [facility].  Appreciate consult, will follow.   Sherry Huger, MD   03/24/2015 6:07 PM

## 2015-03-24 NOTE — Plan of Care (Signed)
Problem: Discharge Progression Outcomes Goal: Other Discharge Outcomes/Goals Outcome: Progressing VSS. No respiratory distress noted. O2 sats in the high 90's on room air. Denies pain. CBG at 1044>400, MD notified. Orders received. Pt is tolerating her diet. Ativan given once for anxiety with improvement. Oncology consult pending. Up to the bathroom with 1xassist.

## 2015-03-24 NOTE — Plan of Care (Signed)
Problem: Discharge Progression Outcomes Goal: Other Discharge Outcomes/Goals Outcome: Progressing Plan of Care Progress to Goal:   Pt came on floor at about 6am. Pt wears bilateral hearing aids but they are at home. No signs of distress at this time. Will continue to monitor.

## 2015-03-24 NOTE — ED Provider Notes (Signed)
[facility] Sherry Prince Note   ____________________________________________  Time seen: Approximately 11 PM I have reviewed the triage vital signs and the triage nursing note.  HISTORY  Chief Complaint Shortness of Breath   Historian Patient  HPI Sherry Prince is a 69 y.o. female who has a history of COPD/emphysema, who uses CPAP at night, and does not need O2 during the day, who reports 2 days of worsening shortness of breath and wheezing despite using her nebulizer all day today, presents due to worsening shortness of breath and wheezing. Mild cough without sputum. No fever. No chest pain. History also of CHF, but denies any increased lower extremity edema or weight gain. Symptoms are moderate. States that the last time this happened she did have to be admitted to the hospital.    Past Medical History  Diagnosis Date  . Diabetes mellitus without complication   . Chronic kidney disease   . GERD (gastroesophageal reflux disease)   . Hypertension   . Ear pain   . Serum calcium elevated   . Floaters   . Sleep apnea   . Cancer   . Liver cancer   . Cervical cancer   . Bone cancer   . CHF (congestive heart failure)     Patient Active Problem List   Diagnosis Date Noted  . Bilateral occipital neuralgia 03/20/2015  . DJD (degenerative joint disease) of knee 02/08/2015  . DDD (degenerative disc disease), lumbar 02/08/2015    Past Surgical History  Procedure Laterality Date  . Abdominal hysterectomy    . Breast surgery    . Eye surgery      Current Outpatient Rx  Name  Route  Sig  Dispense  Refill  . albuterol (PROVENTIL) (2.5 MG/3ML) 0.083% nebulizer solution   Nebulization   Take 2.5 mg by nebulization every 2 (two) hours as needed for wheezing or shortness of breath.         . ARTIFICIAL TEARS 0.1-0.3 % SOLN   Both Eyes   Place 1 drop into both eyes as needed (dry eyes).          Marland Kitchen atorvastatin (LIPITOR) 40 MG  tablet   Oral   Take 40 mg by mouth daily.         . budesonide-formoterol (SYMBICORT) 160-4.5 MCG/ACT inhaler   Inhalation   Inhale 2 puffs into the lungs 2 (two) times daily.         Marland Kitchen buPROPion (WELLBUTRIN SR) 100 MG 12 hr tablet   Oral   Take 100 mg by mouth daily.         . citalopram (CELEXA) 40 MG tablet   Oral   Take 40 mg by mouth every morning.         . Doxylamine-DM 6.25-15 MG/15ML LIQD   Oral   Take 1 Dose by mouth every 6 (six) hours as needed (cough).         . fluticasone (FLONASE) 50 MCG/ACT nasal spray   Nasal   Place 1 spray into the nose daily as needed for allergies or rhinitis.          . furosemide (LASIX) 40 MG tablet   Oral   Take 60 mg by mouth daily.         Marland Kitchen gabapentin (NEURONTIN) 300 MG capsule   Oral   Take 300 mg by mouth at bedtime.         Marland Kitchen glipiZIDE (GLUCOTROL) 5 MG tablet   Oral  Take 5 mg by mouth daily before breakfast.         . insulin detemir (LEVEMIR) 100 UNIT/ML injection   Subcutaneous   Inject 35 Units into the skin at bedtime.         . isosorbide mononitrate (IMDUR) 30 MG 24 hr tablet   Oral   Take 30 mg by mouth daily.         Marland Kitchen lisinopril (PRINIVIL,ZESTRIL) 10 MG tablet   Oral   Take 10 mg by mouth daily.         Marland Kitchen LORazepam (ATIVAN) 1 MG tablet   Oral   Take 1 mg by mouth every 8 (eight) hours as needed for anxiety.         . metFORMIN (GLUCOPHAGE) 500 MG tablet   Oral   Take 1,000 mg by mouth 2 (two) times daily with a meal.          . metoprolol tartrate (LOPRESSOR) 25 MG tablet   Oral   Take 25 mg by mouth 2 (two) times daily.         Marland Kitchen omeprazole (PRILOSEC) 20 MG capsule   Oral   Take 20 mg by mouth daily as needed.          . promethazine (PHENERGAN) 25 MG tablet   Oral   Take 25 mg by mouth every 6 (six) hours as needed for nausea or vomiting.         . tiotropium (SPIRIVA HANDIHALER) 18 MCG inhalation capsule   Inhalation   Place 1 capsule into inhaler and  inhale daily.         . traZODone (DESYREL) 50 MG tablet   Oral   Take 50 mg by mouth at bedtime.         . ciprofloxacin (CIPRO) 250 MG tablet   Oral   Take 1 tablet (250 mg total) by mouth 2 (two) times daily. Patient not taking: Reported on 03/20/2015   14 tablet   0   . predniSONE (DELTASONE) 20 MG tablet   Oral   Take 3 tablets (60 mg total) by mouth daily. Patient not taking: Reported on 02/08/2015   12 tablet   0   . traMADol (ULTRAM) 50 MG tablet      Limit  2-4 tablets by mouth per day if tolerated Patient not taking: Reported on 03/24/2015   90 tablet   0     Allergies Penicillins  Family History  Problem Relation Age of Onset  . Cancer Father     Social History Social History  Substance Use Topics  . Smoking status: Former Research scientist (life sciences)  . Smokeless tobacco: None  . Alcohol Use: No    Review of Systems  Constitutional: Negative for fever. Eyes: Negative for visual changes. ENT: Negative for sore throat. Cardiovascular: Negative for chest pain. Respiratory: Positive for shortness of breath. Gastrointestinal: Negative for abdominal pain, vomiting and diarrhea. Genitourinary: Negative for dysuria. Musculoskeletal: Negative for back pain. Skin: Negative for rash. Neurological: Negative for headache. 10 point Review of Systems otherwise negative ____________________________________________   PHYSICAL EXAM:  VITAL SIGNS: ED Triage Vitals  Enc Vitals Group     BP 03/23/15 2252 131/88 mmHg     Pulse Rate 03/23/15 2252 80     Resp 03/23/15 2252 26     Temp 03/23/15 2252 98.3 F (36.8 C)     Temp Source 03/23/15 2252 Oral     SpO2 03/23/15 2252 98 %  Weight 03/23/15 2252 261 lb (118.389 kg)     Height --      Head Cir --      Peak Flow --      Pain Score 03/23/15 2253 8     Pain Loc --      Pain Edu? --      Excl. in [place]? --      Constitutional: Alert and oriented. Well appearing and in mild respiratory distress when attempting to  talk. Eyes: Conjunctivae are normal. PERRL. Normal extraocular movements. ENT   Head: Normocephalic and atraumatic.   Nose: No congestion/rhinnorhea.   Mouth/Throat: Mucous membranes are moist.   Neck: No stridor. Cardiovascular/Chest: Normal rate, regular rhythm.  No murmurs, rubs, or gallops. Respiratory: Moderate wheezing in all fields without retractions. No rhonchi. Gastrointestinal: Soft. No distention, no guarding, no rebound. Nontender . Obese  Genitourinary/rectal:Deferred Musculoskeletal: Nontender with normal range of motion in all extremities. No joint effusions.  No lower extremity tenderness.  Mild 1+ lower extremity edema bilaterally. Neurologic:  Normal speech and language. No gross or focal neurologic deficits are appreciated. Skin:  Skin is warm, dry and intact. No rash noted. Psychiatric: Mood and affect are normal. Speech and behavior are normal. Patient exhibits appropriate insight and judgment.  ____________________________________________   EKG I, Lisa Roca, MD, the attending physician have personally viewed and interpreted all ECGs.  77 beats). Normal sinus rhythm. Narrow QRS. Normal axis. Normal ST and T-wave. ____________________________________________  LABS (pertinent positives/negatives)  Basic metabolic panel within normal limits White blood cell count 8.5 with no left shift, hemoglobin 10.5, platelet count 246 Troponin less than 0.03  ____________________________________________  RADIOLOGY All Xrays were viewed by me. Imaging interpreted by Radiologist.  Chest x-ray two-view:  IMPRESSION: Masslike opacity within the left mid lung is nonspecific in etiology. Given history of history of cancer, recommend dedicated evaluation of the chest with contrast-enhanced CT for definitive Characterization.  Chest CT with contrast:  IMPRESSION: 2.4 x 3 cm nodule in the left mid lung with left hilar and diffuse mediastinal  lymphadenopathy. 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 Multiple liver lesions likely representing liver metastasis. __________________________________________  PROCEDURES  Procedure(s) performed: None  Critical Care performed: None  ____________________________________________   ED COURSE / ASSESSMENT AND PLAN  CONSULTATIONS: Hospitalist for admission  Pertinent labs & imaging results that were available during my care of the patient were reviewed by me and considered in my medical decision making (see chart for details).  Initially clinically suspicious of COPD exacerbation. Patient received albuterol nebulized treatment followed by 2 DuoNeb's. Patient had 2 nebs today at home and was unable to get adequate improvement. Patient was treated with Solu-Medrol here in the emergency department. There is no hypoxia, however the patient becomes very short of breath and increased wheezing even trying to speak sentences. She will likely need admission.  Chest x-ray showed left midlung opacity, and radiologist recommended chest CT.  Including labs for kidney function prior to ordering chest CT.  ----------------------------------------- 3:39 AM on 03/24/2015 -----------------------------------------  I reviewed the CT results and shared the results of lung mass likely lung cancer with liver lesions with the patient.  Patient will be admitted to the hospital for COPD exacerbation and workup/evaluation of new lung mass. She has had a history of breast cancer stage V with metastases to lung, liver, and bone per the patient in 2010 and had completed chemotherapy at that time.   Patient / Family / Caregiver informed of clinical  course, medical decision-making process, and agree with plan.  ___________________________________________   FINAL CLINICAL IMPRESSION(S) / ED DIAGNOSES   Final diagnoses:  COPD with acute exacerbation  Primary lung cancer, left        Lisa Roca, MD 03/24/15 [phone]

## 2015-03-24 NOTE — Progress Notes (Signed)
[place] [facility] [place] NAME: Sherry Prince    MR#:  [id number]  DATE OF BIRTH:  20-Jun-1946  SUBJECTIVE:  CHIEF COMPLAINT:   Chief Complaint  Patient presents with  . Shortness of Breath   - admitted for COPD exacerbation - currently on room air, breathing is improved  REVIEW OF SYSTEMS:  Review of Systems  Constitutional: Negative for fever and chills.  Eyes: Negative for blurred vision, double vision and photophobia.  Respiratory: Positive for cough, shortness of breath and wheezing.   Cardiovascular: Negative for chest pain and palpitations.  Gastrointestinal: Negative for nausea, vomiting, abdominal pain, diarrhea and constipation.  Genitourinary: Negative for dysuria.  Musculoskeletal: Negative for myalgias.  Skin: Negative for itching and rash.  Neurological: Negative for dizziness, sensory change, speech change, focal weakness, seizures and headaches.  Psychiatric/Behavioral: Negative for depression. The patient is not nervous/anxious.     DRUG ALLERGIES:   Allergies  Allergen Reactions  . Penicillins Hives    VITALS:  Blood pressure 123/70, pulse 102, temperature 98 F (36.7 C), temperature source Oral, resp. rate 22, height '5\' 5"'$  (1.651 m), weight 120.203 kg (265 lb), SpO2 98 %.  PHYSICAL EXAMINATION:  Physical Exam  GENERAL:  69 y.o.-year-old patient lying in the bed with no acute distress.  EYES: Pupils equal, round, reactive to light and accommodation. No scleral icterus. Extraocular muscles intact.  HEENT: Head atraumatic, normocephalic. Oropharynx and nasopharynx clear.  NECK:  Supple, no jugular venous distention. No thyroid enlargement, no tenderness.  LUNGS: Normal breath sounds bilaterally, no rales,rhonchi or crepitation. No use of accessory muscles of respiration. Scattered expiratory wheezing noted on exam. CARDIOVASCULAR: S1, S2 normal. No murmurs, rubs, or gallops.  ABDOMEN: Soft, nontender,  nondistended. Bowel sounds present. No organomegaly or mass.  EXTREMITIES: No pedal edema, cyanosis, or clubbing.  NEUROLOGIC: Cranial nerves II through XII are intact. Muscle strength 5/5 in all extremities. Sensation intact. Gait not checked.  PSYCHIATRIC: The patient is alert and oriented x 3.  SKIN: No obvious rash, lesion, or ulcer.    LABORATORY PANEL:   CBC  Recent Labs Lab 03/24/15 0126  WBC 8.5  HGB 10.5*  HCT 32.8*  PLT 246   ------------------------------------------------------------------------------------------------------------------  Chemistries   Recent Labs Lab 03/24/15 0126  NA 138  K 3.6  CL 104  CO2 27  GLUCOSE 224*  BUN 14  CREATININE 0.89  CALCIUM 8.9   ------------------------------------------------------------------------------------------------------------------  Cardiac Enzymes  Recent Labs Lab 03/24/15 0838  TROPONINI <0.03   ------------------------------------------------------------------------------------------------------------------  RADIOLOGY:  Dg Chest 2 View  03/23/2015   CLINICAL DATA:  Patient with wheezing and shortness of breath for 1 week. Nonproductive cough. History of lung cancer.  EXAM: CHEST  2 VIEW  COMPARISON:  CT chest 03/24/2014 ; chest radiograph 06/18/2014  FINDINGS: Anterior chest wall Port-A-Cath is present. Stable cardiac and mediastinal contours. Interval development of masslike opacity within the left mid lung. Remainder of the lungs are clear. No pleural effusion or pneumothorax. Mid thoracic spine degenerative changes.  IMPRESSION: Masslike opacity within the left mid lung is nonspecific in etiology. Given history of history of cancer, recommend dedicated evaluation of the chest with contrast-enhanced CT for definitive characterization.   Electronically Signed   By: Lovey Newcomer M.D.   On: 03/23/2015 23:45   Ct Chest W Contrast  03/24/2015   CLINICAL DATA:  Shortness of breath on exertion for 1 week.  History of liver cancer, cervical cancer, bone cancer. Opacity in the  left lung on chest radiograph.  EXAM: CT CHEST WITH CONTRAST  TECHNIQUE: Multidetector CT imaging of the chest was performed during intravenous contrast administration.  CONTRAST:  80m OMNIPAQUE IOHEXOL 300 MG/ML  SOLN  COMPARISON:  Chest radiograph 03/23/2015.  CT chest 03/24/2014.  FINDINGS: Corresponding to the chest x-ray abnormality, there is a nodule in the left mid lung measuring 2.4 by 3 cm diameter. This is suspicious for bronchogenic carcinoma. There is left hilar lymphadenopathy measuring 3 cm diameter and subcarinal lymphadenopathy measuring up to 1.9 cm diameter. Enlarged right peritracheal and pretracheal lymph nodes are also present. Prominent lymph nodes of the thoracic inlet.  Normal heart size. Normal caliber thoracic aorta. Esophagus is decompressed. Small subpleural nodules in the right apex appear unchanged since prior study and are probably benign. No focal consolidation in the lungs. No pleural effusions. No pneumothorax.  Included portions of the upper abdominal organs demonstrate multiple diffuse low-attenuation lesions throughout the liver, each measuring up to about a cm diameter. These were not present previously and likely represent liver metastasis. No adrenal gland nodules. Degenerative changes in the spine. No destructive bone lesions appreciated.  IMPRESSION: 2.4 x 3 cm nodule in the left mid lung with left hilar and diffuse mediastinal lymphadenopathy. 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 Multiple liver lesions likely representing liver metastasis.   Electronically Signed   By: WLucienne CapersM.D.   On: 03/24/2015 03:05    EKG:   Orders placed or performed during the hospital encounter of 03/23/15  . ED EKG  . ED EKG    ASSESSMENT AND PLAN:   69year old female with past medical history significant for CK D, insulin-dependent diabetes mellitus, hypertension, stage IV  breast cancer with liver, lung, bone metastases in the past presents to the hospital secondary to worsening breathing  #1 acute on chronic COPD exacerbation-started on IV steroids, continue inhalers and nebulizer treatment. -Continue oxygen support as needed. -Patient uses CPAP with oxygen at nighttime but usually no daytime oxygen. -Continue Levaquin at this time. -CT of the chest with no lung consolidation noted.  2. Lung mass-2 x 3 cm left lung mass noted, suspecting primary bronchogenic carcinoma. -However patient does have history of breast cancer with metastases to lungs. -[facility] -Follows with Dr. KHumphrey Rollsas an outpatient. We will refer her for bronchoscopy as outpatient -There's a [facility] Recommend follow-up with them.  3. Insulin-dependent diabetes mellitus-sugars are elevated order A1c. -Pre-meal insulin started. Lantus dose adjusted. Also on sliding scale insulin.  4. Peripheral neuropathy-continue gabapentin  5. Depression and anxiety-continue home medications  6. DVT prophylaxis-subcutaneous heparin  All the records are reviewed and case discussed with Care Management/Social Workerr. Management plans discussed with the patient, family and they are in agreement.  CODE STATUS: FULL CODE  TOTAL TIME TAKING CARE OF THIS PATIENT: 36 minutes.   POSSIBLE D/C IN 1-2 DAYS, DEPENDING ON CLINICAL CONDITION.   KGladstone LighterM.D on 03/24/2015 at 12:32 PM  Between 7am to 6pm - Pager - [phone]  After 6pm go to [email or web address] - password [email or web address]  Office  [phone] CC: Primary care physician; RTheotis Burrow MD

## 2015-03-24 NOTE — H&P (Signed)
Sherry Prince is an 69 y.o. female.   Chief Complaint: Shortness of breath HPI: The patient presents emergency department complaining of cough and wheezing as well as shortness of breath that has been worsening over the last 2 days. She denies fever or significant sputum production. Emergency department she received multiple breathing treatments as well as Solu-Medrol. Chest x-ray no pneumonia however an opacity with well needed borders prompted CT scan of the chest showed a mass within the left lung. Past medical history significant for stage IV breast cancer status post mastectomy and five-year remission. The patient recently had a PET scan which showed no areas of malignancy. Due to recurrence of tumor and persistent wheezing the emergency department staff called for admission.  Past Medical History  Diagnosis Date  . Diabetes mellitus without complication   . Chronic kidney disease   . GERD (gastroesophageal reflux disease)   . Hypertension   . Ear pain   . Serum calcium elevated   . Floaters   . Sleep apnea   . Cancer   . Liver cancer   . Cervical cancer   . Bone cancer   . CHF (congestive heart failure)     Past Surgical History  Procedure Laterality Date  . Abdominal hysterectomy    . Breast surgery    . Eye surgery      Family History  Problem Relation Age of Onset  . Cancer Father    Social History:  reports that she has quit smoking. She does not have any smokeless tobacco history on file. She reports that she does not drink alcohol or use illicit drugs.  Allergies:  Allergies  Allergen Reactions  . Penicillins Hives    Medications Prior to Admission  Medication Sig Dispense Refill  . albuterol (PROVENTIL) (2.5 MG/3ML) 0.083% nebulizer solution Take 2.5 mg by nebulization every 2 (two) hours as needed for wheezing or shortness of breath.    . ARTIFICIAL TEARS 0.1-0.3 % SOLN Place 1 drop into both eyes as needed (dry eyes).     Marland Kitchen atorvastatin (LIPITOR) 40 MG  tablet Take 40 mg by mouth daily.    . budesonide-formoterol (SYMBICORT) 160-4.5 MCG/ACT inhaler Inhale 2 puffs into the lungs 2 (two) times daily.    Marland Kitchen buPROPion (WELLBUTRIN SR) 100 MG 12 hr tablet Take 100 mg by mouth daily.    . citalopram (CELEXA) 40 MG tablet Take 40 mg by mouth every morning.    . Doxylamine-DM 6.25-15 MG/15ML LIQD Take 1 Dose by mouth every 6 (six) hours as needed (cough).    . fluticasone (FLONASE) 50 MCG/ACT nasal spray Place 1 spray into the nose daily as needed for allergies or rhinitis.     . furosemide (LASIX) 40 MG tablet Take 60 mg by mouth daily.    Marland Kitchen gabapentin (NEURONTIN) 300 MG capsule Take 300 mg by mouth at bedtime.    Marland Kitchen glipiZIDE (GLUCOTROL) 5 MG tablet Take 5 mg by mouth daily before breakfast.    . insulin detemir (LEVEMIR) 100 UNIT/ML injection Inject 35 Units into the skin at bedtime.    . isosorbide mononitrate (IMDUR) 30 MG 24 hr tablet Take 30 mg by mouth daily.    Marland Kitchen lisinopril (PRINIVIL,ZESTRIL) 10 MG tablet Take 10 mg by mouth daily.    Marland Kitchen LORazepam (ATIVAN) 1 MG tablet Take 1 mg by mouth every 8 (eight) hours as needed for anxiety.    . metFORMIN (GLUCOPHAGE) 500 MG tablet Take 1,000 mg by mouth 2 (two) times  daily with a meal.     . metoprolol tartrate (LOPRESSOR) 25 MG tablet Take 25 mg by mouth 2 (two) times daily.    Marland Kitchen omeprazole (PRILOSEC) 20 MG capsule Take 20 mg by mouth daily as needed.     . promethazine (PHENERGAN) 25 MG tablet Take 25 mg by mouth every 6 (six) hours as needed for nausea or vomiting.    . tiotropium (SPIRIVA HANDIHALER) 18 MCG inhalation capsule Place 1 capsule into inhaler and inhale daily.    . traZODone (DESYREL) 50 MG tablet Take 50 mg by mouth at bedtime.    . ciprofloxacin (CIPRO) 250 MG tablet Take 1 tablet (250 mg total) by mouth 2 (two) times daily. (Patient not taking: Reported on 03/20/2015) 14 tablet 0  . predniSONE (DELTASONE) 20 MG tablet Take 3 tablets (60 mg total) by mouth daily. (Patient not taking:  Reported on 02/08/2015) 12 tablet 0  . traMADol (ULTRAM) 50 MG tablet Limit  2-4 tablets by mouth per day if tolerated (Patient not taking: Reported on 03/24/2015) 90 tablet 0    Results for orders placed or performed during the hospital encounter of 03/23/15 (from the past 48 hour(s))  Basic metabolic panel     Status: Abnormal   Collection Time: 03/24/15  1:26 AM  Result Value Ref Range   Sodium 138 135 - 145 mmol/L   Potassium 3.6 3.5 - 5.1 mmol/L   Chloride 104 101 - 111 mmol/L   CO2 27 22 - 32 mmol/L   Glucose, Bld 224 (H) 65 - 99 mg/dL   BUN 14 6 - 20 mg/dL   Creatinine, Ser 0.89 0.44 - 1.00 mg/dL   Calcium 8.9 8.9 - 10.3 mg/dL   GFR calc non Af Amer >60 >60 mL/min   GFR calc Af Amer >60 >60 mL/min    Comment: (NOTE) The eGFR has been calculated using the CKD EPI equation. This calculation has not been validated in all clinical situations. eGFR's persistently <60 mL/min signify possible Chronic Kidney Disease.    Anion gap 7 5 - 15  CBC with Differential     Status: Abnormal   Collection Time: 03/24/15  1:26 AM  Result Value Ref Range   WBC 8.5 3.6 - 11.0 K/uL   RBC 3.74 (L) 3.80 - 5.20 MIL/uL   Hemoglobin 10.5 (L) 12.0 - 16.0 g/dL   HCT 32.8 (L) 35.0 - 47.0 %   MCV 87.8 80.0 - 100.0 fL   MCH 28.2 26.0 - 34.0 pg   MCHC 32.1 32.0 - 36.0 g/dL   RDW 14.0 11.5 - 14.5 %   Platelets 246 150 - 440 K/uL   Neutrophils Relative % 63 %   Neutro Abs 5.3 1.4 - 6.5 K/uL   Lymphocytes Relative 25 %   Lymphs Abs 2.1 1.0 - 3.6 K/uL   Monocytes Relative 8 %   Monocytes Absolute 0.7 0.2 - 0.9 K/uL   Eosinophils Relative 3 %   Eosinophils Absolute 0.3 0 - 0.7 K/uL   Basophils Relative 1 %   Basophils Absolute 0.1 0 - 0.1 K/uL  Troponin I     Status: None   Collection Time: 03/24/15  1:26 AM  Result Value Ref Range   Troponin I <0.03 <0.031 ng/mL    Comment:        NO INDICATION OF MYOCARDIAL INJURY.   Glucose, capillary     Status: Abnormal   Collection Time: 03/24/15  6:25 AM   Result Value Ref Range  Glucose-Capillary 365 (H) 65 - 99 mg/dL   Dg Chest 2 View  03/23/2015   CLINICAL DATA:  Patient with wheezing and shortness of breath for 1 week. Nonproductive cough. History of lung cancer.  EXAM: CHEST  2 VIEW  COMPARISON:  CT chest 03/24/2014 ; chest radiograph 06/18/2014  FINDINGS: Anterior chest wall Port-A-Cath is present. Stable cardiac and mediastinal contours. Interval development of masslike opacity within the left mid lung. Remainder of the lungs are clear. No pleural effusion or pneumothorax. Mid thoracic spine degenerative changes.  IMPRESSION: Masslike opacity within the left mid lung is nonspecific in etiology. Given history of history of cancer, recommend dedicated evaluation of the chest with contrast-enhanced CT for definitive characterization.   Electronically Signed   By: Lovey Newcomer M.D.   On: 03/23/2015 23:45   Ct Chest W Contrast  03/24/2015   CLINICAL DATA:  Shortness of breath on exertion for 1 week. History of liver cancer, cervical cancer, bone cancer. Opacity in the left lung on chest radiograph.  EXAM: CT CHEST WITH CONTRAST  TECHNIQUE: Multidetector CT imaging of the chest was performed during intravenous contrast administration.  CONTRAST:  58m OMNIPAQUE IOHEXOL 300 MG/ML  SOLN  COMPARISON:  Chest radiograph 03/23/2015.  CT chest 03/24/2014.  FINDINGS: Corresponding to the chest x-ray abnormality, there is a nodule in the left mid lung measuring 2.4 by 3 cm diameter. This is suspicious for bronchogenic carcinoma. There is left hilar lymphadenopathy measuring 3 cm diameter and subcarinal lymphadenopathy measuring up to 1.9 cm diameter. Enlarged right peritracheal and pretracheal lymph nodes are also present. Prominent lymph nodes of the thoracic inlet.  Normal heart size. Normal caliber thoracic aorta. Esophagus is decompressed. Small subpleural nodules in the right apex appear unchanged since prior study and are probably benign. No focal consolidation  in the lungs. No pleural effusions. No pneumothorax.  Included portions of the upper abdominal organs demonstrate multiple diffuse low-attenuation lesions throughout the liver, each measuring up to about a cm diameter. These were not present previously and likely represent liver metastasis. No adrenal gland nodules. Degenerative changes in the spine. No destructive bone lesions appreciated.  IMPRESSION: 2.4 x 3 cm nodule in the left mid lung with left hilar and diffuse mediastinal lymphadenopathy. 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 Multiple liver lesions likely representing liver metastasis.   Electronically Signed   By: WLucienne CapersM.D.   On: 03/24/2015 03:05    Review of Systems  Constitutional: Negative for fever and chills.  HENT: Negative for sore throat and tinnitus.   Eyes: Negative for blurred vision and redness.  Respiratory: Positive for cough, shortness of breath and wheezing.   Cardiovascular: Negative for chest pain, palpitations, orthopnea and PND.  Gastrointestinal: Negative for nausea, vomiting, abdominal pain and diarrhea.  Genitourinary: Negative for dysuria, urgency and frequency.  Musculoskeletal: Negative for myalgias and joint pain.  Skin: Negative for rash.       No lesions  Neurological: Negative for speech change, focal weakness and weakness.  Endo/Heme/Allergies: Does not bruise/bleed easily.       No temperature intolerance  Psychiatric/Behavioral: Negative for depression and suicidal ideas.    Blood pressure 141/91, pulse 102, temperature 99.1 F (37.3 C), temperature source Oral, resp. rate 22, height '5\' 5"'  (1.651 m), weight 120.203 kg (265 lb), SpO2 97 %. Physical Exam  Vitals reviewed. Constitutional: She is oriented to person, place, and time. She appears well-developed and well-nourished. No distress.  HENT:  Head: Normocephalic and atraumatic.  Mouth/Throat: Oropharynx is clear and moist.  Eyes: Conjunctivae and EOM are  normal. Pupils are equal, round, and reactive to light. No scleral icterus.  Neck: Normal range of motion. Neck supple. No JVD present. No tracheal deviation present. No thyromegaly present.  Cardiovascular: Normal rate, regular rhythm and normal heart sounds.  Exam reveals no gallop and no friction rub.   No murmur heard. Respiratory: Effort normal. She has decreased breath sounds in the left lower field. She has wheezes in the right upper field and the left upper field.  GI: Soft. Bowel sounds are normal. She exhibits no distension. There is no tenderness.  Genitourinary:  Deferred  Musculoskeletal: Normal range of motion. She exhibits edema.  Lymphadenopathy:    She has no cervical adenopathy.  Neurological: She is alert and oriented to person, place, and time. No cranial nerve deficit. She exhibits normal muscle tone.  Skin: Skin is warm and dry.  Psychiatric: She has a normal mood and affect. Her behavior is normal. Judgment and thought content normal.     Assessment/Plan This is a 69 year old Caucasian female admitted for acute on chronic respiratory failure with hypoxemia as well as new left lung mass. 1. Acute on chronic respiratory failure: Likely exacerbation of COPD. The patient has known emphysema. I started the patient on Levaquin. We will continue steroid taper and provide renal treatments for symptomatic relief. The patient resume her corticosteroid and anticholinergic inhalers per home regimen. 2. Lung mass: Oncology consult ordered 3. Hypertension: Continue lisinopril 4. CHF: Presumably systolic; stable. Continue metoprolol and Lasix 5. Diabetes mellitus type 2: Continue basal insulin coverage reduced or hospital diet. Add sliding scale insulin 6. Depression: Continue Celexa and Wellbutrin as well as trazodone 7. DVT prophylaxis: Heparin 8. GI prophylaxis: Pantoprazole The patient is a DO NOT RESUSCITATE. Time spent on admission orders and patient care approximately 45  minutes  Harrie Foreman 03/24/2015, 8:32 AM

## 2015-03-25 LAB — GLUCOSE, CAPILLARY
Glucose-Capillary: 183 mg/dL — ABNORMAL HIGH (ref 65–99)
Glucose-Capillary: 239 mg/dL — ABNORMAL HIGH (ref 65–99)
Glucose-Capillary: 263 mg/dL — ABNORMAL HIGH (ref 65–99)
Glucose-Capillary: 293 mg/dL — ABNORMAL HIGH (ref 65–99)

## 2015-03-25 LAB — BASIC METABOLIC PANEL
Anion gap: 6 (ref 5–15)
BUN: 21 mg/dL — AB (ref 6–20)
CHLORIDE: 104 mmol/L (ref 101–111)
CO2: 29 mmol/L (ref 22–32)
CREATININE: 0.91 mg/dL (ref 0.44–1.00)
Calcium: 8.8 mg/dL — ABNORMAL LOW (ref 8.9–10.3)
GFR calc Af Amer: >60 mL/min (ref >60)
GLUCOSE: 199 mg/dL — AB (ref 65–99)
Potassium: 4 mmol/L (ref 3.5–5.1)
SODIUM: 139 mmol/L (ref 135–145)

## 2015-03-25 NOTE — Plan of Care (Signed)
Problem: Discharge Progression Outcomes Goal: Hemodynamically stable Outcome: Progressing VSS. O2 sats in the high 90's. Dyspnea on exertion. Blood sugars improved. Tolerates diet. Tomorrow CT guided lung nodule biopsy. Goal: Other Discharge Outcomes/Goals Outcome: Progressing VSS.

## 2015-03-25 NOTE — Progress Notes (Signed)
[place] [facility] [place] NAME: Sherry Prince    MR#:  [id number]  DATE OF BIRTH:  1946/01/23  SUBJECTIVE:  CHIEF COMPLAINT:   Chief Complaint  Patient presents with  . Shortness of Breath   - Breathing is much improved, on CPAP at nights - sugars are better controlled  REVIEW OF SYSTEMS:  Review of Systems  Constitutional: Negative for fever and chills.  Eyes: Negative for blurred vision, double vision and photophobia.  Respiratory: Positive for cough and wheezing. Negative for shortness of breath.   Cardiovascular: Negative for chest pain and palpitations.  Gastrointestinal: Negative for nausea, vomiting, abdominal pain, diarrhea and constipation.  Genitourinary: Negative for dysuria.  Musculoskeletal: Negative for myalgias.  Skin: Negative for itching and rash.  Neurological: Negative for dizziness, sensory change, speech change, focal weakness, seizures and headaches.  Psychiatric/Behavioral: Negative for depression. The patient is not nervous/anxious.     DRUG ALLERGIES:   Allergies  Allergen Reactions  . Penicillins Hives    VITALS:  Blood pressure 102/69, pulse 90, temperature 98.9 F (37.2 C), temperature source Oral, resp. rate 16, height '5\' 5"'$  (1.651 m), weight 129.094 kg (284 lb 9.6 oz), SpO2 100 %.  PHYSICAL EXAMINATION:  Physical Exam  GENERAL:  69 y.o.-year-old patient lying in the bed with no acute distress.  EYES: Pupils equal, round, reactive to light and accommodation. No scleral icterus. Extraocular muscles intact.  HEENT: Head atraumatic, normocephalic. Oropharynx and nasopharynx clear.  NECK:  Supple, no jugular venous distention. No thyroid enlargement, no tenderness.  LUNGS: Normal breath sounds bilaterally, no rales,rhonchi or crepitation. No use of accessory muscles of respiration. Improving Scattered expiratory wheezing noted on exam. CARDIOVASCULAR: S1, S2 normal. No murmurs, rubs, or gallops.   ABDOMEN: Soft, nontender, nondistended. Bowel sounds present. No organomegaly or mass.  EXTREMITIES: No pedal edema, cyanosis, or clubbing.  NEUROLOGIC: Cranial nerves II through XII are intact. Muscle strength 5/5 in all extremities. Sensation intact. Gait not checked.  PSYCHIATRIC: The patient is alert and oriented x 3.  SKIN: No obvious rash, lesion, or ulcer.    LABORATORY PANEL:   CBC  Recent Labs Lab 03/24/15 0126  WBC 8.5  HGB 10.5*  HCT 32.8*  PLT 246   ------------------------------------------------------------------------------------------------------------------  Chemistries   Recent Labs Lab 03/25/15 0417  NA 139  K 4.0  CL 104  CO2 29  GLUCOSE 199*  BUN 21*  CREATININE 0.91  CALCIUM 8.8*   ------------------------------------------------------------------------------------------------------------------  Cardiac Enzymes  Recent Labs Lab 03/24/15 0838  TROPONINI <0.03   ------------------------------------------------------------------------------------------------------------------  RADIOLOGY:  Dg Chest 2 View  03/23/2015   CLINICAL DATA:  Patient with wheezing and shortness of breath for 1 week. Nonproductive cough. History of lung cancer.  EXAM: CHEST  2 VIEW  COMPARISON:  CT chest 03/24/2014 ; chest radiograph 06/18/2014  FINDINGS: Anterior chest wall Port-A-Cath is present. Stable cardiac and mediastinal contours. Interval development of masslike opacity within the left mid lung. Remainder of the lungs are clear. No pleural effusion or pneumothorax. Mid thoracic spine degenerative changes.  IMPRESSION: Masslike opacity within the left mid lung is nonspecific in etiology. Given history of history of cancer, recommend dedicated evaluation of the chest with contrast-enhanced CT for definitive characterization.   Electronically Signed   By: Lovey Newcomer M.D.   On: 03/23/2015 23:45   Ct Chest W Contrast  03/24/2015   CLINICAL DATA:  Shortness of breath  on exertion for 1 week. History of liver cancer, cervical  cancer, bone cancer. Opacity in the left lung on chest radiograph.  EXAM: CT CHEST WITH CONTRAST  TECHNIQUE: Multidetector CT imaging of the chest was performed during intravenous contrast administration.  CONTRAST:  61m OMNIPAQUE IOHEXOL 300 MG/ML  SOLN  COMPARISON:  Chest radiograph 03/23/2015.  CT chest 03/24/2014.  FINDINGS: Corresponding to the chest x-ray abnormality, there is a nodule in the left mid lung measuring 2.4 by 3 cm diameter. This is suspicious for bronchogenic carcinoma. There is left hilar lymphadenopathy measuring 3 cm diameter and subcarinal lymphadenopathy measuring up to 1.9 cm diameter. Enlarged right peritracheal and pretracheal lymph nodes are also present. Prominent lymph nodes of the thoracic inlet.  Normal heart size. Normal caliber thoracic aorta. Esophagus is decompressed. Small subpleural nodules in the right apex appear unchanged since prior study and are probably benign. No focal consolidation in the lungs. No pleural effusions. No pneumothorax.  Included portions of the upper abdominal organs demonstrate multiple diffuse low-attenuation lesions throughout the liver, each measuring up to about a cm diameter. These were not present previously and likely represent liver metastasis. No adrenal gland nodules. Degenerative changes in the spine. No destructive bone lesions appreciated.  IMPRESSION: 2.4 x 3 cm nodule in the left mid lung with left hilar and diffuse mediastinal lymphadenopathy. 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 Multiple liver lesions likely representing liver metastasis.   Electronically Signed   By: WLucienne CapersM.D.   On: 03/24/2015 03:05    EKG:   Orders placed or performed during the hospital encounter of 03/23/15  . ED EKG  . ED EKG    ASSESSMENT AND PLAN:   69year old female with past medical history significant for CK D, insulin-dependent diabetes mellitus,  hypertension, stage IV breast cancer with liver, lung, bone metastases in the past presents to the hospital secondary to worsening breathing  #1 acute on chronic COPD exacerbation-continue on IV steroids, continue inhalers and nebulizer treatment. -Can be changed to oral steroids tomorrow -Continue oxygen support as needed. -Patient uses CPAP with oxygen at nighttime but usually no daytime oxygen. -Continue Levaquin at this time. -CT of the chest with no lung consolidation noted.  2. Lung mass-2 x 3 cm left lung mass noted, suspecting primary bronchogenic carcinoma. -However patient does have history of breast cancer with metastases to lungs, liver in past. But last PET/CT according to daughter was negative for any lesions -[facility]. -Follows with Dr. SBea Lauraas an outpatient.  - will check with radiology about CT guided biopsy as pt will be high risk for a sedated procedure - appreciate oncology consult  3. Insulin-dependent diabetes mellitus-sugars are elevated  -Hb A1c of 8.5 - Sugars elevated secondary to being on steroids. - Lantus dose adjusted. Also on sliding scale insulin. His continue pre-meal insulin as sugars are borderline.  4. Peripheral neuropathy-continue gabapentin  5. Depression and anxiety-continue home medications  6. DVT prophylaxis-subcutaneous heparin  All the records are reviewed and case discussed with Care Management/Social Workerr. Management plans discussed with the patient, family and they are in agreement.  CODE STATUS: FULL CODE  TOTAL TIME TAKING CARE OF THIS PATIENT: 36 minutes.   POSSIBLE D/C tomorrow or the day after, DEPENDING ON CLINICAL CONDITION.   KGladstone LighterM.D on 03/25/2015 at 11:34 AM  Between 7am to 6pm - Pager - [phone]  After 6pm go to [email or web address] - password [email or web address] [facility]  Office  [phone] CC: Primary care physician;  Theotis Burrow, MD

## 2015-03-25 NOTE — Progress Notes (Signed)
Discussed with Dr. Earleen Newport from interventional radiology. We'll put in for an IR consult for tomorrow for CT-guided lung nodule biopsy.

## 2015-03-25 NOTE — Plan of Care (Signed)
Problem: Discharge Progression Outcomes Goal: Other Discharge Outcomes/Goals Outcome: Progressing Plan of Care Progress to Goal:   Pt denies pain, pt report restless leg. Pt was given some mustard to help. Pt uses CPAP at nights. No other signs of distress noted. Will continue to monitor.

## 2015-03-26 DIAGNOSIS — R918 Other nonspecific abnormal finding of lung field: Secondary | ICD-10-CM

## 2015-03-26 LAB — APTT: aPTT: 26 seconds (ref 24–36)

## 2015-03-26 LAB — PROTIME-INR
INR: 1.07
PROTHROMBIN TIME: 14.1 s (ref 11.4–15.0)

## 2015-03-26 LAB — GLUCOSE, CAPILLARY
Glucose-Capillary: 252 mg/dL — ABNORMAL HIGH (ref 65–99)
Glucose-Capillary: 178 mg/dL — ABNORMAL HIGH (ref 65–99)
Glucose-Capillary: 149 mg/dL — ABNORMAL HIGH (ref 65–99)
Glucose-Capillary: 149 mg/dL — ABNORMAL HIGH (ref 65–99)

## 2015-03-26 MED ORDER — HYDROCODONE-ACETAMINOPHEN 5-325 MG PO TABS
1.0000 | ORAL_TABLET | Freq: Four times a day (QID) | ORAL | Status: DC | PRN
  Administered 2015-03-26: 1 via ORAL
  Filled 2015-03-26: qty 1

## 2015-03-26 MED ORDER — PREDNISONE 50 MG PO TABS
50.0000 mg | ORAL_TABLET | Freq: Every day | ORAL | Status: DC
  Administered 2015-03-27: 50 mg via ORAL
  Filled 2015-03-26: qty 1

## 2015-03-26 NOTE — Progress Notes (Signed)
[facility] [place] NAME: Sherry Prince    MR#:  [id number]  DATE OF BIRTH:  1946-05-02  SUBJECTIVE:  CHIEF COMPLAINT:   Chief Complaint  Patient presents with  . Shortness of Breath   - radiology recommended pulmonary consult and possible EBUS biopsy due to the location of the nodule. Discussed with Dr. Bea Laura who is patients primary pulmonologist. - patient feels better  REVIEW OF SYSTEMS:  Review of Systems  Constitutional: Negative for fever and chills.  Eyes: Negative for blurred vision, double vision and photophobia.  Respiratory: Positive for cough and wheezing. Negative for shortness of breath.   Cardiovascular: Negative for chest pain and palpitations.  Gastrointestinal: Negative for nausea, vomiting, abdominal pain, diarrhea and constipation.  Genitourinary: Negative for dysuria.  Musculoskeletal: Negative for myalgias.  Skin: Negative for itching and rash.  Neurological: Negative for dizziness, sensory change, speech change, focal weakness, seizures and headaches.  Psychiatric/Behavioral: Negative for depression. The patient is not nervous/anxious.     DRUG ALLERGIES:   Allergies  Allergen Reactions  . Penicillins Hives    VITALS:  Blood pressure 131/62, pulse 73, temperature 97.4 F (36.3 C), temperature source Oral, resp. rate 20, height '5\' 5"'$  (1.651 m), weight 118.162 kg (260 lb 8 oz), SpO2 98 %.  PHYSICAL EXAMINATION:  Physical Exam  GENERAL:  69 y.o.-year-old patient lying in the bed with no acute distress.  EYES: Pupils equal, round, reactive to light and accommodation. No scleral icterus. Extraocular muscles intact.  HEENT: Head atraumatic, normocephalic. Oropharynx and nasopharynx clear.  NECK:  Supple, no jugular venous distention. No thyroid enlargement, no tenderness.  LUNGS: Normal breath sounds bilaterally, no rales,rhonchi or crepitation. No use of accessory muscles of respiration. Improving  Scattered expiratory wheezing noted on exam. CARDIOVASCULAR: S1, S2 normal. No murmurs, rubs, or gallops.  ABDOMEN: Soft, nontender, nondistended. Bowel sounds present. No organomegaly or mass.  EXTREMITIES: No pedal edema, cyanosis, or clubbing.  NEUROLOGIC: Cranial nerves II through XII are intact. Muscle strength 5/5 in all extremities. Sensation intact. Gait not checked.  PSYCHIATRIC: The patient is alert and oriented x 3.  SKIN: No obvious rash, lesion, or ulcer.    LABORATORY PANEL:   CBC  Recent Labs Lab 03/24/15 0126  WBC 8.5  HGB 10.5*  HCT 32.8*  PLT 246   ------------------------------------------------------------------------------------------------------------------  Chemistries   Recent Labs Lab 03/25/15 0417  NA 139  K 4.0  CL 104  CO2 29  GLUCOSE 199*  BUN 21*  CREATININE 0.91  CALCIUM 8.8*   ------------------------------------------------------------------------------------------------------------------  Cardiac Enzymes  Recent Labs Lab 03/24/15 0838  TROPONINI <0.03   ------------------------------------------------------------------------------------------------------------------  RADIOLOGY:  No results found.  EKG:   Orders placed or performed during the hospital encounter of 03/23/15  . ED EKG  . ED EKG    ASSESSMENT AND PLAN:   69 year old female with past medical history significant for CK D, insulin-dependent diabetes mellitus, hypertension, stage IV breast cancer with liver, lung, bone metastases in the past presents to the hospital secondary to worsening breathing  #1 acute on chronic COPD exacerbation-continue on IV steroids, continue inhalers and nebulizer treatment. -Can be changed to oral steroids today -Continue oxygen support as needed. -Patient uses CPAP with oxygen at nighttime but usually no daytime oxygen. -Continue Levaquin at this time for 5 days -CT of the chest with no lung consolidation noted.  2. Lung  mass-2 x 3 cm left lung mass noted, suspecting primary bronchogenic carcinoma. -However  patient does have history of breast cancer with metastases to lungs, liver in past. But last PET/CT according to daughter was negative for any lesions -Followed with [facility] oncology in the past. -Follows with Dr. Bea Laura as an outpatient.  - possible EBUS and biopsy- pulmonary consulted - appreciate oncology consult  3. Insulin-dependent diabetes mellitus-sugars are elevated  -Hb A1c of 8.5 - Sugars elevated secondary to being on steroids. - Lantus dose adjusted. Also on sliding scale insulin. Discontinue pre-meal insulin as sugars are borderline.  4. Peripheral neuropathy-continue gabapentin  5. Depression and anxiety-continue home medications  6. DVT prophylaxis-subcutaneous heparin  All the records are reviewed and case discussed with Care Management/Social Workerr. Management plans discussed with the patient, family and they are in agreement.  CODE STATUS: FULL CODE  TOTAL TIME TAKING CARE OF THIS PATIENT: 36 minutes.   POSSIBLE D/C  In 1-2 days, DEPENDING ON CLINICAL CONDITION.   Gladstone Lighter M.D on 03/26/2015 at 12:34 PM  Between 7am to 6pm - Pager - [phone]  After 6pm go to [email or web address] - password [email or web address] [facility]  Office  [phone]  CC: Primary care physician; Theotis Burrow, MD

## 2015-03-26 NOTE — Care Management (Signed)
Admitted to [facility] with the diagnosis of lung mass. Lives alone. Daughter-in-law is Skip Mayer [phone]). No home health. No skilled facility. Uses CPAP at night. CPAP provided by [facility] for many years. No portable oxygen. Uses cane, rolling walker and scooter to aid in ambulation. Last seen Dr. William Hamburger at [facility] 2-3 months ago. Seen Dr. Primus Bravo for pain management of the knee in August. Will be seen by Dr, Primus Bravo for knee pain again October 3rd for nerve block.  Hard of hearing. Uses hearing aids. Life Alert in the home. No falls. Good appetite. Takes care of all basic activities of daily living herself, still drives. Daughter-in-law will transport. NPO. Scheduled for lung biopsy today. Shelbie Ammons RN MSN Care Management [phone]

## 2015-03-26 NOTE — Plan of Care (Signed)
Problem: Discharge Progression Outcomes Goal: Other Discharge Outcomes/Goals Outcome: Progressing Plan of care progress to goals: 1. No c/o pain, sat up in chair most of evening then rested comfortably in bed rest of night  2. Hemodynamically:             -VSS, afebrile              -O2 sats stable on 3L, CPAP worn most of the night              -Nebs PRN, Solumedrol given as scheduled  3. NPO since midnight for CT guided lung nodule biopsy today  4. +1 assist to [facility]. Bed alarm on, hourly rounding. Pt understands how to use call system for assistance.

## 2015-03-26 NOTE — Plan of Care (Signed)
Problem: Discharge Progression Outcomes Goal: Other Discharge Outcomes/Goals Outcome: Progressing SOB on exertion. Has expiratory wheezes. Wearing CPAP for nighttime sleep apnea. NPO for lung nodule biopsy. Assisted to bathroom with one assist. One episode of unsteady balance but gained it back after a moment of rest. Uses oxygen prn. Sat in chair most of day. Lung biopsy cancelled. Seen by Pulmonologist, planning bronchoscopy on Thursday. Denied pain until later this afternoon. Received order for Norco, gave one and will evaluate pain relief. Received SVN for wheezing and SOB and tolerated well. Ate well for all meals.

## 2015-03-26 NOTE — Consult Note (Signed)
[facility] Consultation      Date: 03/26/2015,   MRN# [id number] Sherry Prince [birth year] Code Status:     Code Status Orders        Start     Ordered   03/24/15 0712  Do not attempt resuscitation (DNR)   Continuous    Question Answer Comment  In the event of cardiac or respiratory ARREST Do not call a "code blue"   In the event of cardiac or respiratory ARREST Do not perform Intubation, CPR, defibrillation or ACLS   In the event of cardiac or respiratory ARREST Use medication by any route, position, wound care, and other measures to relive pain and suffering. May use oxygen, suction and manual treatment of airway obstruction as needed for comfort.      03/24/15 3762    Advance Directive Documentation        Most Recent Value   Type of Advance Directive  Healthcare Power of Attorney, Living will   Pre-existing out of facility DNR order (yellow form or pink MOST form)     "MOST" Form in Place?       Hosp day:'@[email or web address]' @ Referring MD: '@[email or web address]' @     PCP:      AdmissionWeight: 261 lb (118.389 kg)                 CurrentWeight: 260 lb 8 oz (118.162 kg) Sherry Prince is a 69 y.o. old female seen in consultation for abnormal CT scan.     CHIEF COMPLAINT:   Patient admitted for acute COPD exacerbation   HISTORY OF PRESENT ILLNESS   69 yo white female seen today for Left upper lobe lung mass with adenopathy, patient is a former heavy smoker with h/o metastatic breast cancer Patient sees Dr Bea Laura as Pulmonologist and I was asked to see her in consult for evaluation for EBUS/ENB  Patient with significant COPD history, currently sitting in chair resting comfortably, wheezing seems to have improved since admission Patient has moderate to high risk for intra/post op pulmonary/respiratory complications  Patient has been treated for Mets BRCA since 2010. Patient has new left lung mass with adenopathy   The Risks and Benefits of the  Bronchoscopy with EBUS were explained to patient/family and I have discussed the risk for acute bleeding, increased chance of infection, increased chance of respiratory failure and cardiac arrest and death. I have also explained to avoid all types of NSAIDs to decrease chance of bleeding, and to avoid food and drinks the midnight prior to procedure.  The patient/family understand the risks and benefits and have agreed to proceed with procedure.    PAST MEDICAL HISTORY   Past Medical History  Diagnosis Date  . Diabetes mellitus without complication   . Chronic kidney disease   . GERD (gastroesophageal reflux disease)   . Hypertension   . Ear pain   . Serum calcium elevated   . Floaters   . Sleep apnea   . Cancer   . Liver cancer   . Cervical cancer   . Bone cancer   . CHF (congestive heart failure)      SURGICAL HISTORY   Past Surgical History  Procedure Laterality Date  . Abdominal hysterectomy    . Breast surgery    . Eye surgery       FAMILY HISTORY   Family History  Problem Relation Age of Onset  . Cancer Father      SOCIAL HISTORY   Social History  Substance Use Topics  . Smoking status: Former Research scientist (life sciences)  . Smokeless tobacco: None  . Alcohol Use: No     MEDICATIONS    Home Medication:  No current outpatient prescriptions on file.  Current Medication:  Current facility-administered medications:  .  acetaminophen (TYLENOL) tablet 650 mg, 650 mg, Oral, Q6H PRN, 650 mg at 03/24/15 2011 **OR** acetaminophen (TYLENOL) suppository 650 mg, 650 mg, Rectal, Q6H PRN, Harrie Foreman, MD .  albuterol (PROVENTIL) (2.5 MG/3ML) 0.083% nebulizer solution 2.5 mg, 2.5 mg, Nebulization, Q2H PRN, Harrie Foreman, MD .  atorvastatin (LIPITOR) tablet 40 mg, 40 mg, Oral, Daily, Harrie Foreman, MD, 40 mg at 03/26/15 1240 .  budesonide-formoterol (SYMBICORT) 160-4.5 MCG/ACT inhaler 2 puff, 2 puff, Inhalation, BID, Harrie Foreman, MD, 2 puff at 03/26/15 1241 .   buPROPion [facility] SR) 12 hr tablet 100 mg, 100 mg, Oral, Daily, Harrie Foreman, MD, 100 mg at 03/26/15 1236 .  citalopram (CELEXA) tablet 40 mg, 40 mg, Oral, q morning - 10a, Harrie Foreman, MD, 40 mg at 03/26/15 1240 .  docusate sodium (COLACE) capsule 100 mg, 100 mg, Oral, BID, Harrie Foreman, MD, 100 mg at 03/26/15 1243 .  fluticasone (FLONASE) 50 MCG/ACT nasal spray 1 spray, 1 spray, Each Nare, Daily PRN, Harrie Foreman, MD .  furosemide (LASIX) tablet 60 mg, 60 mg, Oral, Daily, Harrie Foreman, MD, 60 mg at 03/26/15 1238 .  gabapentin (NEURONTIN) capsule 300 mg, 300 mg, Oral, QHS, Harrie Foreman, MD, 300 mg at 03/25/15 2005 .  guaiFENesin-dextromethorphan (ROBITUSSIN DM) 100-10 MG/5ML syrup 5 mL, 5 mL, Oral, Q6H PRN, Harrie Foreman, MD .  heparin injection 5,000 Units, 5,000 Units, Subcutaneous, 3 times per day, Harrie Foreman, MD, 5,000 Units at 03/25/15 2005 .  insulin aspart (novoLOG) injection 0-15 Units, 0-15 Units, Subcutaneous, TID WC, Harrie Foreman, MD, 3 Units at 03/26/15 1242 .  insulin detemir (LEVEMIR) injection 35 Units, 35 Units, Subcutaneous, QHS, Gladstone Lighter, MD, 35 Units at 03/25/15 2206 .  isosorbide mononitrate (IMDUR) 24 hr tablet 30 mg, 30 mg, Oral, Daily, Harrie Foreman, MD, 30 mg at 03/26/15 1235 .  levofloxacin (LEVAQUIN) tablet 500 mg, 500 mg, Oral, Daily, Harrie Foreman, MD, 500 mg at 03/26/15 1236 .  lisinopril (PRINIVIL,ZESTRIL) tablet 10 mg, 10 mg, Oral, Daily, Harrie Foreman, MD, 10 mg at 03/26/15 1236 .  LORazepam (ATIVAN) tablet 1 mg, 1 mg, Oral, Q8H PRN, Harrie Foreman, MD, 1 mg at 03/25/15 2248 .  metoprolol tartrate (LOPRESSOR) tablet 25 mg, 25 mg, Oral, BID, Harrie Foreman, MD, 25 mg at 03/26/15 1239 .  pantoprazole (PROTONIX) EC tablet 40 mg, 40 mg, Oral, Daily, Harrie Foreman, MD, 40 mg at 03/26/15 1235 .  polyvinyl alcohol (LIQUIFILM TEARS) 1.4 % ophthalmic solution 1 drop, 1 drop, Both Eyes, PRN,  Harrie Foreman, MD .  Derrill Memo ON 03/27/2015] predniSONE (DELTASONE) tablet 50 mg, 50 mg, Oral, Q breakfast, Gladstone Lighter, MD .  promethazine (PHENERGAN) tablet 25 mg, 25 mg, Oral, Q6H PRN, Harrie Foreman, MD, 25 mg at 03/24/15 2011 .  sodium chloride 0.9 % injection 3 mL, 3 mL, Intravenous, Q12H, Harrie Foreman, MD, 3 mL at 03/25/15 2200 .  tiotropium [facility]) inhalation capsule 18 mcg, 1 capsule, Inhalation, Daily, Harrie Foreman, MD, 18 mcg at 03/26/15 1241 .  traZODone (DESYREL) tablet 50 mg, 50 mg, Oral, QHS, Harrie Foreman, MD, 50 mg at 03/25/15 2005  ALLERGIES   Penicillins     REVIEW OF SYSTEMS   Review of Systems  Constitutional: Positive for malaise/fatigue. Negative for fever and chills.  Eyes: Negative for blurred vision.  Respiratory: Positive for cough, shortness of breath and wheezing.   Cardiovascular: Positive for orthopnea. Negative for chest pain and palpitations.  Gastrointestinal: Negative for heartburn, nausea, vomiting and abdominal pain.  Musculoskeletal: Negative for myalgias and neck pain.  Skin: Negative for rash.  Neurological: Negative for dizziness and headaches.  Psychiatric/Behavioral: The patient is nervous/anxious.      VS: BP 130/90 mmHg  Pulse 80  Temp(Src) 97.4 F (36.3 C) (Oral)  Resp 20  Ht '5\' 5"'  (1.651 m)  Wt 260 lb 8 oz (118.162 kg)  BMI 43.35 kg/m2  SpO2 98%     PHYSICAL EXAM  Physical Exam  Constitutional: She is oriented to person, place, and time. She appears well-developed and well-nourished. No distress.  HENT:  Head: Normocephalic and atraumatic.  Mouth/Throat: No oropharyngeal exudate.  Eyes: EOM are normal. Pupils are equal, round, and reactive to light. No scleral icterus.  Neck: Normal range of motion. Neck supple.  Cardiovascular: Normal rate, regular rhythm and normal heart sounds.   No murmur heard. Pulmonary/Chest: No stridor. No respiratory distress. She has no wheezes. She has no rales.    Abdominal: Soft. Bowel sounds are normal. She exhibits no distension. There is no tenderness. There is no rebound.  Musculoskeletal: Normal range of motion. She exhibits no edema.  Neurological: She is alert and oriented to person, place, and time. She displays normal reflexes. Coordination normal.  Skin: Skin is warm. She is not diaphoretic.  Psychiatric: She has a normal mood and affect.        LABS    Recent Labs     03/24/15  0126  03/25/15  0417  03/26/15  0911  HGB  10.5*   --    --   HCT  32.8*   --    --   MCV  87.8   --    --   WBC  8.5   --    --   BUN  14  21*   --   CREATININE  0.89  0.91   --   GLUCOSE  224*  199*   --   CALCIUM  8.9  8.8*   --   INR   --    --   1.07  ,    No results for input(s): PH in the last 72 hours.  Invalid input(s): PCO2, PO2, BASEEXCESS, BASEDEFICITE, TFT    CULTURE RESULTS   No results found for this or any previous visit (from the past 240 hour(s)).        IMAGING    Dg Chest 2 View  03/23/2015   CLINICAL DATA:  Patient with wheezing and shortness of breath for 1 week. Nonproductive cough. History of lung cancer.  EXAM: CHEST  2 VIEW  COMPARISON:  CT chest 03/24/2014 ; chest radiograph 06/18/2014  FINDINGS: Anterior chest wall Port-A-Cath is present. Stable cardiac and mediastinal contours. Interval development of masslike opacity within the left mid lung. Remainder of the lungs are clear. No pleural effusion or pneumothorax. Mid thoracic spine degenerative changes.  IMPRESSION: Masslike opacity within the left mid lung is nonspecific in etiology. Given history of history of cancer, recommend dedicated evaluation of the chest with contrast-enhanced CT for definitive characterization.   Electronically Signed   By: Polly Cobia.D.  On: 03/23/2015 23:45   Ct Chest W Contrast  03/24/2015   CLINICAL DATA:  Shortness of breath on exertion for 1 week. History of liver cancer, cervical cancer, bone cancer. Opacity in the left  lung on chest radiograph.  EXAM: CT CHEST WITH CONTRAST  TECHNIQUE: Multidetector CT imaging of the chest was performed during intravenous contrast administration.  CONTRAST:  39m OMNIPAQUE IOHEXOL 300 MG/ML  SOLN  COMPARISON:  Chest radiograph 03/23/2015.  CT chest 03/24/2014.  FINDINGS: Corresponding to the chest x-ray abnormality, there is a nodule in the left mid lung measuring 2.4 by 3 cm diameter. This is suspicious for bronchogenic carcinoma. There is left hilar lymphadenopathy measuring 3 cm diameter and subcarinal lymphadenopathy measuring up to 1.9 cm diameter. Enlarged right peritracheal and pretracheal lymph nodes are also present. Prominent lymph nodes of the thoracic inlet.  Normal heart size. Normal caliber thoracic aorta. Esophagus is decompressed. Small subpleural nodules in the right apex appear unchanged since prior study and are probably benign. No focal consolidation in the lungs. No pleural effusions. No pneumothorax.  Included portions of the upper abdominal organs demonstrate multiple diffuse low-attenuation lesions throughout the liver, each measuring up to about a cm diameter. These were not present previously and likely represent liver metastasis. No adrenal gland nodules. Degenerative changes in the spine. No destructive bone lesions appreciated.  IMPRESSION: 2.4 x 3 cm nodule in the left mid lung with left hilar and diffuse mediastinal lymphadenopathy. 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 Multiple liver lesions likely representing liver metastasis.   Electronically Signed   By: WLucienne CapersM.D.   On: 03/24/2015 03:05        ASSESSMENT/PLAN   69yo white female with acute COPD exacerbation with New Left Lung mass with adenopathy-likely primary lung cancer  1.will proceed with EBUS/ENB for Thursday Morning at 1000AM 2.continue inhaled meds  3.continue prednisone and ABX as prescribed    I have personally obtained a history, examined the  patient, evaluated laboratory and independently reviewed imaging results, formulated the assessment and plan and placed orders.  The Patient requires high complexity decision making for assessment and support, frequent evaluation and titration of therapies, application of advanced monitoring technologies and extensive interpretation of multiple databases.\  Patient is satisfied with Plan of action and management.    KCorrin Parker M.D.  LVelora HecklerPulmonary & Critical Care Medicine  Medical Director ILa QuintaDirector AUpper Valley Medical CenterCardio-Pulmonary Department

## 2015-03-27 LAB — BASIC METABOLIC PANEL
ANION GAP: 6 (ref 5–15)
BUN: 31 mg/dL — AB (ref 6–20)
CHLORIDE: 102 mmol/L (ref 101–111)
CO2: 30 mmol/L (ref 22–32)
Calcium: 8.8 mg/dL — ABNORMAL LOW (ref 8.9–10.3)
Creatinine, Ser: 1.19 mg/dL — ABNORMAL HIGH (ref 0.44–1.00)
GFR calc non Af Amer: 45 mL/min — ABNORMAL LOW (ref >60)
GFR, EST AFRICAN AMERICAN: 53 mL/min — AB (ref >60)
Glucose, Bld: 162 mg/dL — ABNORMAL HIGH (ref 65–99)
POTASSIUM: 4 mmol/L (ref 3.5–5.1)
SODIUM: 138 mmol/L (ref 135–145)

## 2015-03-27 LAB — CBC
HCT: 32.4 % — ABNORMAL LOW (ref 35.0–47.0)
HEMOGLOBIN: 10.4 g/dL — AB (ref 12.0–16.0)
MCH: 28.1 pg (ref 26.0–34.0)
MCHC: 32.1 g/dL (ref 32.0–36.0)
MCV: 87.6 fL (ref 80.0–100.0)
Platelets: 263 10*3/uL (ref 150–440)
RBC: 3.7 MIL/uL — AB (ref 3.80–5.20)
RDW: 14.3 % (ref 11.5–14.5)
WBC: 9.1 10*3/uL (ref 3.6–11.0)

## 2015-03-27 LAB — CANCER ANTIGEN 27.29: CA 27.29: 22.3 U/mL (ref 0.0–38.6)

## 2015-03-27 LAB — GLUCOSE, CAPILLARY
Glucose-Capillary: 133 mg/dL — ABNORMAL HIGH (ref 65–99)
Glucose-Capillary: 193 mg/dL — ABNORMAL HIGH (ref 65–99)

## 2015-03-27 MED ORDER — LEVOFLOXACIN 500 MG PO TABS: 500.0000 mg | ORAL_TABLET | Freq: Every day | ORAL | Status: DC

## 2015-03-27 MED ORDER — FUROSEMIDE 40 MG PO TABS: 40.0000 mg | ORAL_TABLET | Freq: Every day | ORAL | Status: DC

## 2015-03-27 MED ORDER — MORPHINE SULFATE (PF) 2 MG/ML IV SOLN
2.0000 mg | INTRAVENOUS | Status: AC
  Administered 2015-03-27: 2 mg via INTRAVENOUS
  Filled 2015-03-27: qty 1

## 2015-03-27 MED ORDER — PHENYLEPHRINE HCL 0.25 % NA SOLN
1.0000 | Freq: Four times a day (QID) | NASAL | Status: DC | PRN
  Filled 2015-03-27: qty 15

## 2015-03-27 MED ORDER — PREDNISONE 10 MG PO TABS
20.0000 mg | ORAL_TABLET | Freq: Every day | ORAL | Status: DC
Start: 2015-03-27 — End: 2015-04-04

## 2015-03-27 MED ORDER — LIDOCAINE HCL 2 % EX GEL
1.0000 "application " | Freq: Once | CUTANEOUS | Status: DC
  Filled 2015-03-27: qty 5

## 2015-03-27 MED ORDER — HYDROCODONE-ACETAMINOPHEN 5-325 MG PO TABS: 1.0000 | ORAL_TABLET | Freq: Four times a day (QID) | ORAL | Status: DC | PRN

## 2015-03-27 MED ORDER — DOCUSATE SODIUM 100 MG PO CAPS: 100.0000 mg | ORAL_CAPSULE | Freq: Two times a day (BID) | ORAL | Status: DC

## 2015-03-27 MED ORDER — HEPARIN SOD (PORK) LOCK FLUSH 100 UNIT/ML IV SOLN
500.0000 [IU] | INTRAVENOUS | Status: DC | PRN
  Filled 2015-03-27: qty 5

## 2015-03-27 NOTE — Discharge Summary (Signed)
[facility] [place] NAME: Sherry Prince    MR#:  [id number]  DATE OF BIRTH:  1946/03/21  DATE OF ADMISSION:  03/23/2015 ADMITTING PHYSICIAN: Harrie Foreman, MD  DATE OF DISCHARGE: 03/27/2015  PRIMARY CARE PHYSICIAN: Theotis Burrow, MD    ADMISSION DIAGNOSIS:  COPD with acute exacerbation [J44.1] Primary lung cancer, left [C34.92]  DISCHARGE DIAGNOSIS:  Active Problems:   Lung mass   SECONDARY DIAGNOSIS:   Past Medical History  Diagnosis Date  . Diabetes mellitus without complication   . Chronic kidney disease   . GERD (gastroesophageal reflux disease)   . Hypertension   . Ear pain   . Serum calcium elevated   . Floaters   . Sleep apnea   . Cancer   . Liver cancer   . Cervical cancer   . Bone cancer   . CHF (congestive heart failure)     HOSPITAL COURSE:   69 year old female with past medical history significant for CK D, insulin-dependent diabetes mellitus, hypertension, stage IV breast cancer with liver, lung, bone metastases in the past presents to the hospital secondary to worsening breathing  #1 acute on chronic COPD exacerbation-continue on  steroids, continue inhalers and nebulizer treatment. -on prednisone taper -Continue oxygen support as needed. -Patient uses CPAP with oxygen at nighttime but usually no daytime oxygen. -Continue Levaquin at this time for 5 days -CT of the chest with no lung consolidation noted.  2. Lung mass-2 x 3 cm left lung mass noted, suspecting primary bronchogenic carcinoma. -However patient does have history of breast cancer with metastases to lungs, liver in past. But last PET/CT according to daughter was negative for any lesions -Followed with [facility] oncology in the past. -Follows with Dr. Bea Laura as an outpatient.  - EBUS and biopsy on 03/29/15- pulmonary consulted - appreciate oncology consult, outpatient follow up  3. Insulin-dependent diabetes mellitus-sugars  are elevated  -Hb A1c of 8.5 - Sugars elevated secondary to being on steroids. - Lantus dose adjusted. Also on sliding scale insulin. Continue glipizide after discharge, hold her metformin for now  4. Peripheral neuropathy-continue gabapentin  5. Depression and anxiety-continue home medications  Discharge today  DISCHARGE CONDITIONS:   Stable  CONSULTS OBTAINED:  Treatment Team:  Holaway Huger, MD Laverle Hobby, MD  DRUG ALLERGIES:   Allergies  Allergen Reactions  . Penicillins Hives    DISCHARGE MEDICATIONS:   Current Discharge Medication List    START taking these medications   Details  docusate sodium (COLACE) 100 MG capsule Take 1 capsule (100 mg total) by mouth 2 (two) times daily. Qty: 10 capsule, Refills: 0    HYDROcodone-acetaminophen (NORCO/VICODIN) 5-325 MG tablet Take 1-2 tablets by mouth every 6 (six) hours as needed for moderate pain or severe pain. Qty: 20 tablet, Refills: 0    levofloxacin (LEVAQUIN) 500 MG tablet Take 1 tablet (500 mg total) by mouth daily. Qty: 4 tablet, Refills: 0      CONTINUE these medications which have CHANGED   Details  furosemide (LASIX) 40 MG tablet Take 1 tablet (40 mg total) by mouth daily. Qty: 30 tablet, Refills: 0    predniSONE (DELTASONE) 10 MG tablet Take 2 tablets (20 mg total) by mouth daily with breakfast. 6 tabs PO x 1 day 5 tabs PO x 1 day 4 tabs PO x 1 day 3 tabs PO x 1 day 2 tabs PO x 1 day 1 tab PO x 1 day and stop Qty:  21 tablet, Refills: 0      CONTINUE these medications which have NOT CHANGED   Details  albuterol (PROVENTIL) (2.5 MG/3ML) 0.083% nebulizer solution Take 2.5 mg by nebulization every 2 (two) hours as needed for wheezing or shortness of breath.    ARTIFICIAL TEARS 0.1-0.3 % SOLN Place 1 drop into both eyes as needed (dry eyes).     atorvastatin (LIPITOR) 40 MG tablet Take 40 mg by mouth daily.    budesonide-formoterol (SYMBICORT) 160-4.5 MCG/ACT inhaler Inhale 2 puffs  into the lungs 2 (two) times daily.    buPROPion (WELLBUTRIN SR) 100 MG 12 hr tablet Take 100 mg by mouth daily.    citalopram (CELEXA) 40 MG tablet Take 40 mg by mouth every morning.    Doxylamine-DM 6.25-15 MG/15ML LIQD Take 1 Dose by mouth every 6 (six) hours as needed (cough).    fluticasone (FLONASE) 50 MCG/ACT nasal spray Place 1 spray into the nose daily as needed for allergies or rhinitis.     gabapentin (NEURONTIN) 300 MG capsule Take 300 mg by mouth at bedtime.    glipiZIDE (GLUCOTROL) 5 MG tablet Take 5 mg by mouth daily before breakfast.    insulin detemir (LEVEMIR) 100 UNIT/ML injection Inject 35 Units into the skin at bedtime.    isosorbide mononitrate (IMDUR) 30 MG 24 hr tablet Take 30 mg by mouth daily.    lisinopril (PRINIVIL,ZESTRIL) 10 MG tablet Take 10 mg by mouth daily.    LORazepam (ATIVAN) 1 MG tablet Take 1 mg by mouth every 8 (eight) hours as needed for anxiety.    metoprolol tartrate (LOPRESSOR) 25 MG tablet Take 25 mg by mouth 2 (two) times daily.    omeprazole (PRILOSEC) 20 MG capsule Take 20 mg by mouth daily as needed.     promethazine (PHENERGAN) 25 MG tablet Take 25 mg by mouth every 6 (six) hours as needed for nausea or vomiting.    tiotropium (SPIRIVA HANDIHALER) 18 MCG inhalation capsule Place 1 capsule into inhaler and inhale daily.    traZODone (DESYREL) 50 MG tablet Take 50 mg by mouth at bedtime.      STOP taking these medications     metFORMIN (GLUCOPHAGE) 500 MG tablet      ciprofloxacin (CIPRO) 250 MG tablet      traMADol (ULTRAM) 50 MG tablet          DISCHARGE INSTRUCTIONS:   1. For bronchoscopy and biopsy of the lung nodule on 03/29/2015 at 10 AM 2. PCP follow-up in 2 weeks 3. Pulmonary follow-up in 1 week  If you experience worsening of your admission symptoms, develop shortness of breath, life threatening emergency, suicidal or homicidal thoughts you must seek medical attention immediately by calling 911 or calling your  MD immediately  if symptoms less severe.  You Must read complete instructions/literature along with all the possible adverse reactions/side effects for all the Medicines you take and that have been prescribed to you. Take any new Medicines after you have completely understood and accept all the possible adverse reactions/side effects.   Please note  You were cared for by a hospitalist during your hospital stay. If you have any questions about your discharge medications or the care you received while you were in the hospital after you are discharged, you can call the unit and asked to speak with the hospitalist on call if the hospitalist that took care of you is not available. Once you are discharged, your primary care physician will handle any further medical  issues. Please note that NO REFILLS for any discharge medications will be authorized once you are discharged, as it is imperative that you return to your primary care physician (or establish a relationship with a primary care physician if you do not have one) for your aftercare needs so that they can reassess your need for medications and monitor your lab values.    Today   CHIEF COMPLAINT:   Chief Complaint  Patient presents with  . Shortness of Breath    VITAL SIGNS:  Blood pressure 99/57, pulse 68, temperature 97.8 F (36.6 C), temperature source Oral, resp. rate 18, height '5\' 5"'$  (1.651 m), weight 118.842 kg (262 lb), SpO2 96 %.  I/O:   Intake/Output Summary (Last 24 hours) at 03/27/15 1112 Last data filed at 03/27/15 0402  Gross per 24 hour  Intake    720 ml  Output   1000 ml  Net   -280 ml    PHYSICAL EXAMINATION:   Physical Exam  GENERAL: 69 y.o.-year-old patient lying in the bed with no acute distress.  EYES: Pupils equal, round, reactive to light and accommodation. No scleral icterus. Extraocular muscles intact.  HEENT: Head atraumatic, normocephalic. Oropharynx and nasopharynx clear.  NECK: Supple, no  jugular venous distention. No thyroid enlargement, no tenderness.  LUNGS: Normal breath sounds bilaterally, no rales,rhonchi or crepitation. No use of accessory muscles of respiration. Improving Scattered expiratory wheezing noted on exam. CARDIOVASCULAR: S1, S2 normal. No murmurs, rubs, or gallops.  ABDOMEN: Soft, nontender, nondistended. Bowel sounds present. No organomegaly or mass.  EXTREMITIES: No pedal edema, cyanosis, or clubbing.  NEUROLOGIC: Cranial nerves II through XII are intact. Muscle strength 5/5 in all extremities. Sensation intact. Gait not checked.  PSYCHIATRIC: The patient is alert and oriented x 3.  SKIN: No obvious rash, lesion, or ulcer.   DATA REVIEW:   CBC  Recent Labs Lab 03/27/15 0457  WBC 9.1  HGB 10.4*  HCT 32.4*  PLT 263    Chemistries   Recent Labs Lab 03/27/15 0457  NA 138  K 4.0  CL 102  CO2 30  GLUCOSE 162*  BUN 31*  CREATININE 1.19*  CALCIUM 8.8*    Cardiac Enzymes  Recent Labs Lab 03/24/15 0838  TROPONINI <0.03    Microbiology Results  Results for orders placed or performed in visit on 05/23/13  Culture, blood (single)     Status: None   Collection Time: 05/23/13  8:13 PM  Result Value Ref Range Status   Micro Text Report   Final       COMMENT                   NO GROWTH AEROBICALLY/ANAEROBICALLY IN 5 DAYS   ANTIBIOTIC                                                      Culture, blood (single)     Status: None   Collection Time: 05/23/13  8:13 PM  Result Value Ref Range Status   Micro Text Report   Final       COMMENT                   NO GROWTH AEROBICALLY/ANAEROBICALLY IN 5 DAYS   ANTIBIOTIC  Culture, expectorated sputum-assessment     Status: None   Collection Time: 05/29/13  2:02 PM  Result Value Ref Range Status   Micro Text Report   Final       COMMENT                   APPEARS TO BE NORMAL FLORA AT 48 HOURS   GRAM STAIN                EXCELLENT  SPECIMEN-90-100% WBC   GRAM STAIN                MANY WHITE BLOOD CELLS   GRAM STAIN                FEW GRAM POSITIVE COCCI   GRAM STAIN                RARE GRAM POSITIVE ROD   ANTIBIOTIC                                                        RADIOLOGY:  No results found.  EKG:   Orders placed or performed during the hospital encounter of 03/23/15  . ED EKG  . ED EKG      Management plans discussed with the patient, family and they are in agreement.  CODE STATUS:     Code Status Orders        Start     Ordered   03/24/15 0712  Do not attempt resuscitation (DNR)   Continuous    Question Answer Comment  In the event of cardiac or respiratory ARREST Do not call a "code blue"   In the event of cardiac or respiratory ARREST Do not perform Intubation, CPR, defibrillation or ACLS   In the event of cardiac or respiratory ARREST Use medication by any route, position, wound care, and other measures to relive pain and suffering. May use oxygen, suction and manual treatment of airway obstruction as needed for comfort.      03/24/15 2500    Advance Directive Documentation        Most Recent Value   Type of Advance Directive  Healthcare Power of Attorney, Living will   Pre-existing out of facility DNR order (yellow form or pink MOST form)     "MOST" Form in Place?        TOTAL TIME TAKING CARE OF THIS PATIENT: 36 minutes.    Gladstone Lighter M.D on 03/27/2015 at 11:12 AM  Between 7am to 6pm - Pager - [phone]  After 6pm go to [email or web address] - password [email or web address]  Office  [phone]  CC: Primary care physician; Theotis Burrow, MD

## 2015-03-27 NOTE — Progress Notes (Signed)
   03/27/15 0940  Clinical Encounter Type  Visited With Patient  Visit Type Initial  Attempted to visit with patient, but patient appeared to turn her head away from me and shut her eyes.  May want to sleep.  Will attempt to return later today if possible.  [place] [phone]

## 2015-03-27 NOTE — Progress Notes (Signed)
Port deaccessed. Discharge instructions given to patient. All questions answered. Pt verbalized understanding. Pt to leave via wheelchair with nursing staff

## 2015-03-27 NOTE — Plan of Care (Signed)
Problem: Discharge Progression Outcomes Goal: Other Discharge Outcomes/Goals Outcome: Progressing Plan of care progress to goals: 1. No c/o pain, sat up in chair most of evening then rested comfortably in bed rest of night   2. Hemodynamically:             -VSS, afebrile               -O2 sats stable on 3L, CPAP worn most of the night               -Nebs PRN 3. Plan for Bronchoscopy Thursday per Pulmonology note 4. +1 assist to [facility]. Bed alarm on, hourly rounding. Pt understands how to use call system for assistance.

## 2015-03-28 ENCOUNTER — Telehealth: Payer: Self-pay | Admitting: Pain Medicine

## 2015-03-28 NOTE — Telephone Encounter (Signed)
Pt having a lung biopsy tom and is sch for gen nerve block of knee on Monday, is it ok to have knee procedure have having lung biopsy

## 2015-03-28 NOTE — Telephone Encounter (Signed)
Nurses Patient has been informed that she is okay to have geniculate nerve block on Monday

## 2015-03-29 ENCOUNTER — Ambulatory Visit
Admission: RE | Admit: 2015-03-29 | Discharge: 2015-03-29 | Disposition: A | Discharge status: Home / Self Care | Payer: Medicare Other | Source: Ambulatory Visit | Attending: Internal Medicine | Admitting: Internal Medicine

## 2015-03-29 ENCOUNTER — Encounter
Admission: RE | Disposition: A | Discharge status: Home / Self Care | Payer: Self-pay | Source: Ambulatory Visit | Attending: Internal Medicine

## 2015-03-29 ENCOUNTER — Encounter: Payer: Self-pay | Admitting: *Deleted

## 2015-03-29 ENCOUNTER — Ambulatory Visit: Payer: Medicare Other

## 2015-03-29 ENCOUNTER — Inpatient Hospital Stay: Admission: RE | Admit: 2015-03-29 | Payer: Medicare Other | Source: Ambulatory Visit | Admitting: Internal Medicine

## 2015-03-29 ENCOUNTER — Ambulatory Visit: Payer: Medicare Other | Admitting: Anesthesiology

## 2015-03-29 ENCOUNTER — Encounter: Admission: RE | Payer: Self-pay | Source: Ambulatory Visit

## 2015-03-29 DIAGNOSIS — I129 Hypertensive chronic kidney disease with stage 1 through stage 4 chronic kidney disease, or unspecified chronic kidney disease: Secondary | ICD-10-CM | POA: Insufficient documentation

## 2015-03-29 DIAGNOSIS — Z85830 Personal history of malignant neoplasm of bone: Secondary | ICD-10-CM | POA: Insufficient documentation

## 2015-03-29 DIAGNOSIS — Z7951 Long term (current) use of inhaled steroids: Secondary | ICD-10-CM | POA: Diagnosis not present

## 2015-03-29 DIAGNOSIS — K219 Gastro-esophageal reflux disease without esophagitis: Secondary | ICD-10-CM | POA: Diagnosis not present

## 2015-03-29 DIAGNOSIS — N189 Chronic kidney disease, unspecified: Secondary | ICD-10-CM | POA: Diagnosis not present

## 2015-03-29 DIAGNOSIS — R918 Other nonspecific abnormal finding of lung field: Secondary | ICD-10-CM | POA: Diagnosis not present

## 2015-03-29 DIAGNOSIS — R932 Abnormal findings on diagnostic imaging of liver and biliary tract: Secondary | ICD-10-CM | POA: Insufficient documentation

## 2015-03-29 DIAGNOSIS — Z79899 Other long term (current) drug therapy: Secondary | ICD-10-CM | POA: Insufficient documentation

## 2015-03-29 DIAGNOSIS — Z853 Personal history of malignant neoplasm of breast: Secondary | ICD-10-CM | POA: Diagnosis not present

## 2015-03-29 DIAGNOSIS — J441 Chronic obstructive pulmonary disease with (acute) exacerbation: Secondary | ICD-10-CM | POA: Diagnosis present

## 2015-03-29 DIAGNOSIS — Z794 Long term (current) use of insulin: Secondary | ICD-10-CM | POA: Insufficient documentation

## 2015-03-29 DIAGNOSIS — Z66 Do not resuscitate: Secondary | ICD-10-CM | POA: Diagnosis not present

## 2015-03-29 DIAGNOSIS — Z880 Allergy status to penicillin: Secondary | ICD-10-CM | POA: Insufficient documentation

## 2015-03-29 DIAGNOSIS — R05 Cough: Secondary | ICD-10-CM | POA: Insufficient documentation

## 2015-03-29 DIAGNOSIS — Z8505 Personal history of malignant neoplasm of liver: Secondary | ICD-10-CM | POA: Insufficient documentation

## 2015-03-29 DIAGNOSIS — Z8541 Personal history of malignant neoplasm of cervix uteri: Secondary | ICD-10-CM | POA: Insufficient documentation

## 2015-03-29 DIAGNOSIS — G4730 Sleep apnea, unspecified: Secondary | ICD-10-CM | POA: Diagnosis not present

## 2015-03-29 DIAGNOSIS — I509 Heart failure, unspecified: Secondary | ICD-10-CM | POA: Insufficient documentation

## 2015-03-29 DIAGNOSIS — Z87891 Personal history of nicotine dependence: Secondary | ICD-10-CM | POA: Diagnosis not present

## 2015-03-29 DIAGNOSIS — E1122 Type 2 diabetes mellitus with diabetic chronic kidney disease: Secondary | ICD-10-CM | POA: Insufficient documentation

## 2015-03-29 DIAGNOSIS — R590 Localized enlarged lymph nodes: Secondary | ICD-10-CM | POA: Diagnosis not present

## 2015-03-29 HISTORY — PX: ENDOBRONCHIAL ULTRASOUND: SHX5096

## 2015-03-29 LAB — GLUCOSE, CAPILLARY
GLUCOSE-CAPILLARY: 178 mg/dL — AB (ref 65–99)
GLUCOSE-CAPILLARY: 222 mg/dL — AB (ref 65–99)

## 2015-03-29 SURGERY — ENDOBRONCHIAL ULTRASOUND (EBUS)
Anesthesia: General

## 2015-03-29 MED ORDER — LIDOCAINE HCL (CARDIAC) 20 MG/ML IV SOLN
INTRAVENOUS | Status: DC | PRN
  Administered 2015-03-29: 60 mg via INTRAVENOUS

## 2015-03-29 MED ORDER — ALBUTEROL SULFATE HFA 108 (90 BASE) MCG/ACT IN AERS
INHALATION_SPRAY | RESPIRATORY_TRACT | Status: DC | PRN
  Administered 2015-03-29: 10 via RESPIRATORY_TRACT

## 2015-03-29 MED ORDER — FENTANYL CITRATE (PF) 100 MCG/2ML IJ SOLN
25.0000 ug | INTRAMUSCULAR | Status: DC | PRN
  Administered 2015-03-29 (×4): 25 ug via INTRAVENOUS

## 2015-03-29 MED ORDER — METOPROLOL TARTRATE 25 MG PO TABS
25.0000 mg | ORAL_TABLET | Freq: Once | ORAL | Status: AC
  Administered 2015-03-29: 25 mg via ORAL

## 2015-03-29 MED ORDER — GLYCOPYRROLATE 0.2 MG/ML IJ SOLN
INTRAMUSCULAR | Status: DC | PRN
  Administered 2015-03-29: .6 mg via INTRAVENOUS

## 2015-03-29 MED ORDER — PHENYLEPHRINE HCL 10 MG/ML IJ SOLN
INTRAMUSCULAR | Status: DC | PRN
  Administered 2015-03-29: 100 ug via INTRAVENOUS
  Administered 2015-03-29: 50 ug via INTRAVENOUS
  Administered 2015-03-29: 100 ug via INTRAVENOUS

## 2015-03-29 MED ORDER — PROPOFOL 10 MG/ML IV BOLUS
INTRAVENOUS | Status: DC | PRN
  Administered 2015-03-29: 20 mg via INTRAVENOUS
  Administered 2015-03-29: 150 mg via INTRAVENOUS
  Administered 2015-03-29: 50 mg via INTRAVENOUS
  Administered 2015-03-29: 30 mg via INTRAVENOUS

## 2015-03-29 MED ORDER — IPRATROPIUM-ALBUTEROL 0.5-2.5 (3) MG/3ML IN SOLN
RESPIRATORY_TRACT | Status: AC
  Filled 2015-03-29: qty 3

## 2015-03-29 MED ORDER — ONDANSETRON HCL 4 MG/2ML IJ SOLN: 4.0000 mg | Freq: Once | INTRAMUSCULAR | Status: DC | PRN

## 2015-03-29 MED ORDER — ROCURONIUM BROMIDE 100 MG/10ML IV SOLN
INTRAVENOUS | Status: DC | PRN
  Administered 2015-03-29: 5 mg via INTRAVENOUS
  Administered 2015-03-29: 10 mg via INTRAVENOUS

## 2015-03-29 MED ORDER — DEXAMETHASONE SODIUM PHOSPHATE 10 MG/ML IJ SOLN
INTRAMUSCULAR | Status: DC | PRN
  Administered 2015-03-29: 5 mg via INTRAVENOUS

## 2015-03-29 MED ORDER — SUCCINYLCHOLINE CHLORIDE 20 MG/ML IJ SOLN
INTRAMUSCULAR | Status: DC | PRN
  Administered 2015-03-29: 100 mg via INTRAVENOUS

## 2015-03-29 MED ORDER — FENTANYL CITRATE (PF) 100 MCG/2ML IJ SOLN
INTRAMUSCULAR | Status: AC
  Administered 2015-03-29: 25 ug
  Filled 2015-03-29: qty 2

## 2015-03-29 MED ORDER — FENTANYL CITRATE (PF) 100 MCG/2ML IJ SOLN
INTRAMUSCULAR | Status: DC | PRN
  Administered 2015-03-29 (×2): 50 ug via INTRAVENOUS

## 2015-03-29 MED ORDER — ONDANSETRON HCL 4 MG/2ML IJ SOLN
INTRAMUSCULAR | Status: DC | PRN
  Administered 2015-03-29: 4 mg via INTRAVENOUS

## 2015-03-29 MED ORDER — IPRATROPIUM-ALBUTEROL 0.5-2.5 (3) MG/3ML IN SOLN
3.0000 mL | Freq: Once | RESPIRATORY_TRACT | Status: AC
  Administered 2015-03-29: 3 mL via RESPIRATORY_TRACT

## 2015-03-29 MED ORDER — NEOSTIGMINE METHYLSULFATE 10 MG/10ML IV SOLN
INTRAVENOUS | Status: DC | PRN
  Administered 2015-03-29: 3 mg via INTRAVENOUS

## 2015-03-29 MED ORDER — MIDAZOLAM HCL 5 MG/5ML IJ SOLN
INTRAMUSCULAR | Status: DC | PRN
  Administered 2015-03-29: 1 mg via INTRAVENOUS

## 2015-03-29 MED ORDER — METOPROLOL TARTRATE 25 MG PO TABS
ORAL_TABLET | ORAL | Status: AC
  Administered 2015-03-29: 25 mg via ORAL
  Filled 2015-03-29: qty 1

## 2015-03-29 MED ORDER — LIDOCAINE HCL 2 % EX GEL: 1.0000 "application " | Freq: Once | CUTANEOUS | Status: DC

## 2015-03-29 MED ORDER — SODIUM CHLORIDE 0.9 % IV SOLN
INTRAVENOUS | Status: DC | PRN
  Administered 2015-03-29: 10:00:00 via INTRAVENOUS

## 2015-03-29 NOTE — Progress Notes (Signed)
Pt c/o midsternal chest pain 5/10.  EKG normal, HR and BP WNL, Skin dry, no nausea or other cardiac complaints.  Dr. Andree Elk called and notified.  Pt worked up last week for cardiac nature and resulted negative.  Pt treated with Fentanyl.  Pt's symptoms improved with Duo neb and fentanyl.

## 2015-03-29 NOTE — H&P (View-Only) (Signed)
[facility] Consultation      Date: 03/26/2015,   MRN# [id number] Sherry Prince 03/09/1946 Code Status:     Code Status Orders        Start     Ordered   03/24/15 0712  Do not attempt resuscitation (DNR)   Continuous    Question Answer Comment  In the event of cardiac or respiratory ARREST Do not call a "code blue"   In the event of cardiac or respiratory ARREST Do not perform Intubation, CPR, defibrillation or ACLS   In the event of cardiac or respiratory ARREST Use medication by any route, position, wound care, and other measures to relive pain and suffering. May use oxygen, suction and manual treatment of airway obstruction as needed for comfort.      03/24/15 1497    Advance Directive Documentation        Most Recent Value   Type of Advance Directive  Healthcare Power of Attorney, Living will   Pre-existing out of facility DNR order (yellow form or pink MOST form)     "MOST" Form in Place?       Hosp day:'@[email or web address]' @ Referring MD: '@[email or web address]' @     PCP:      AdmissionWeight: 261 lb (118.389 kg)                 CurrentWeight: 260 lb 8 oz (118.162 kg) Sherry Prince is a 69 y.o. old female seen in consultation for abnormal CT scan.     CHIEF COMPLAINT:   Patient admitted for acute COPD exacerbation   HISTORY OF PRESENT ILLNESS   69 yo white female seen today for Left upper lobe lung mass with adenopathy, patient is a former heavy smoker with h/o metastatic breast cancer Patient sees Dr Bea Laura as Pulmonologist and I was asked to see her in consult for evaluation for EBUS/ENB  Patient with significant COPD history, currently sitting in chair resting comfortably, wheezing seems to have improved since admission Patient has moderate to high risk for intra/post op pulmonary/respiratory complications  Patient has been treated for Mets BRCA since 2010. Patient has new left lung mass with adenopathy   The Risks and Benefits of the  Bronchoscopy with EBUS were explained to patient/family and I have discussed the risk for acute bleeding, increased chance of infection, increased chance of respiratory failure and cardiac arrest and death. I have also explained to avoid all types of NSAIDs to decrease chance of bleeding, and to avoid food and drinks the midnight prior to procedure.  The patient/family understand the risks and benefits and have agreed to proceed with procedure.    PAST MEDICAL HISTORY   Past Medical History  Diagnosis Date  . Diabetes mellitus without complication   . Chronic kidney disease   . GERD (gastroesophageal reflux disease)   . Hypertension   . Ear pain   . Serum calcium elevated   . Floaters   . Sleep apnea   . Cancer   . Liver cancer   . Cervical cancer   . Bone cancer   . CHF (congestive heart failure)      SURGICAL HISTORY   Past Surgical History  Procedure Laterality Date  . Abdominal hysterectomy    . Breast surgery    . Eye surgery       FAMILY HISTORY   Family History  Problem Relation Age of Onset  . Cancer Father      SOCIAL HISTORY   Social History  Substance Use Topics  . Smoking status: Former Research scientist (life sciences)  . Smokeless tobacco: None  . Alcohol Use: No     MEDICATIONS    Home Medication:  No current outpatient prescriptions on file.  Current Medication:  Current facility-administered medications:  .  acetaminophen (TYLENOL) tablet 650 mg, 650 mg, Oral, Q6H PRN, 650 mg at 03/24/15 2011 **OR** acetaminophen (TYLENOL) suppository 650 mg, 650 mg, Rectal, Q6H PRN, Harrie Foreman, MD .  albuterol (PROVENTIL) (2.5 MG/3ML) 0.083% nebulizer solution 2.5 mg, 2.5 mg, Nebulization, Q2H PRN, Harrie Foreman, MD .  atorvastatin (LIPITOR) tablet 40 mg, 40 mg, Oral, Daily, Harrie Foreman, MD, 40 mg at 03/26/15 1240 .  budesonide-formoterol (SYMBICORT) 160-4.5 MCG/ACT inhaler 2 puff, 2 puff, Inhalation, BID, Harrie Foreman, MD, 2 puff at 03/26/15 1241 .   buPROPion [facility] SR) 12 hr tablet 100 mg, 100 mg, Oral, Daily, Harrie Foreman, MD, 100 mg at 03/26/15 1236 .  citalopram (CELEXA) tablet 40 mg, 40 mg, Oral, q morning - 10a, Harrie Foreman, MD, 40 mg at 03/26/15 1240 .  docusate sodium (COLACE) capsule 100 mg, 100 mg, Oral, BID, Harrie Foreman, MD, 100 mg at 03/26/15 1243 .  fluticasone (FLONASE) 50 MCG/ACT nasal spray 1 spray, 1 spray, Each Nare, Daily PRN, Harrie Foreman, MD .  furosemide (LASIX) tablet 60 mg, 60 mg, Oral, Daily, Harrie Foreman, MD, 60 mg at 03/26/15 1238 .  gabapentin (NEURONTIN) capsule 300 mg, 300 mg, Oral, QHS, Harrie Foreman, MD, 300 mg at 03/25/15 2005 .  guaiFENesin-dextromethorphan (ROBITUSSIN DM) 100-10 MG/5ML syrup 5 mL, 5 mL, Oral, Q6H PRN, Harrie Foreman, MD .  heparin injection 5,000 Units, 5,000 Units, Subcutaneous, 3 times per day, Harrie Foreman, MD, 5,000 Units at 03/25/15 2005 .  insulin aspart (novoLOG) injection 0-15 Units, 0-15 Units, Subcutaneous, TID WC, Harrie Foreman, MD, 3 Units at 03/26/15 1242 .  insulin detemir (LEVEMIR) injection 35 Units, 35 Units, Subcutaneous, QHS, Gladstone Lighter, MD, 35 Units at 03/25/15 2206 .  isosorbide mononitrate (IMDUR) 24 hr tablet 30 mg, 30 mg, Oral, Daily, Harrie Foreman, MD, 30 mg at 03/26/15 1235 .  levofloxacin (LEVAQUIN) tablet 500 mg, 500 mg, Oral, Daily, Harrie Foreman, MD, 500 mg at 03/26/15 1236 .  lisinopril (PRINIVIL,ZESTRIL) tablet 10 mg, 10 mg, Oral, Daily, Harrie Foreman, MD, 10 mg at 03/26/15 1236 .  LORazepam (ATIVAN) tablet 1 mg, 1 mg, Oral, Q8H PRN, Harrie Foreman, MD, 1 mg at 03/25/15 2248 .  metoprolol tartrate (LOPRESSOR) tablet 25 mg, 25 mg, Oral, BID, Harrie Foreman, MD, 25 mg at 03/26/15 1239 .  pantoprazole (PROTONIX) EC tablet 40 mg, 40 mg, Oral, Daily, Harrie Foreman, MD, 40 mg at 03/26/15 1235 .  polyvinyl alcohol (LIQUIFILM TEARS) 1.4 % ophthalmic solution 1 drop, 1 drop, Both Eyes, PRN,  Harrie Foreman, MD .  Derrill Memo ON 03/27/2015] predniSONE (DELTASONE) tablet 50 mg, 50 mg, Oral, Q breakfast, Gladstone Lighter, MD .  promethazine (PHENERGAN) tablet 25 mg, 25 mg, Oral, Q6H PRN, Harrie Foreman, MD, 25 mg at 03/24/15 2011 .  sodium chloride 0.9 % injection 3 mL, 3 mL, Intravenous, Q12H, Harrie Foreman, MD, 3 mL at 03/25/15 2200 .  tiotropium [facility]) inhalation capsule 18 mcg, 1 capsule, Inhalation, Daily, Harrie Foreman, MD, 18 mcg at 03/26/15 1241 .  traZODone (DESYREL) tablet 50 mg, 50 mg, Oral, QHS, Harrie Foreman, MD, 50 mg at 03/25/15 2005  ALLERGIES   Penicillins     REVIEW OF SYSTEMS   Review of Systems  Constitutional: Positive for malaise/fatigue. Negative for fever and chills.  Eyes: Negative for blurred vision.  Respiratory: Positive for cough, shortness of breath and wheezing.   Cardiovascular: Positive for orthopnea. Negative for chest pain and palpitations.  Gastrointestinal: Negative for heartburn, nausea, vomiting and abdominal pain.  Musculoskeletal: Negative for myalgias and neck pain.  Skin: Negative for rash.  Neurological: Negative for dizziness and headaches.  Psychiatric/Behavioral: The patient is nervous/anxious.      VS: BP 130/90 mmHg  Pulse 80  Temp(Src) 97.4 F (36.3 C) (Oral)  Resp 20  Ht '5\' 5"'  (1.651 m)  Wt 260 lb 8 oz (118.162 kg)  BMI 43.35 kg/m2  SpO2 98%     PHYSICAL EXAM  Physical Exam  Constitutional: She is oriented to person, place, and time. She appears well-developed and well-nourished. No distress.  HENT:  Head: Normocephalic and atraumatic.  Mouth/Throat: No oropharyngeal exudate.  Eyes: EOM are normal. Pupils are equal, round, and reactive to light. No scleral icterus.  Neck: Normal range of motion. Neck supple.  Cardiovascular: Normal rate, regular rhythm and normal heart sounds.   No murmur heard. Pulmonary/Chest: No stridor. No respiratory distress. She has no wheezes. She has no rales.    Abdominal: Soft. Bowel sounds are normal. She exhibits no distension. There is no tenderness. There is no rebound.  Musculoskeletal: Normal range of motion. She exhibits no edema.  Neurological: She is alert and oriented to person, place, and time. She displays normal reflexes. Coordination normal.  Skin: Skin is warm. She is not diaphoretic.  Psychiatric: She has a normal mood and affect.        LABS    Recent Labs     03/24/15  0126  03/25/15  0417  03/26/15  0911  HGB  10.5*   --    --   HCT  32.8*   --    --   MCV  87.8   --    --   WBC  8.5   --    --   BUN  14  21*   --   CREATININE  0.89  0.91   --   GLUCOSE  224*  199*   --   CALCIUM  8.9  8.8*   --   INR   --    --   1.07  ,    No results for input(s): PH in the last 72 hours.  Invalid input(s): PCO2, PO2, BASEEXCESS, BASEDEFICITE, TFT    CULTURE RESULTS   No results found for this or any previous visit (from the past 240 hour(s)).        IMAGING    Dg Chest 2 View  03/23/2015   CLINICAL DATA:  Patient with wheezing and shortness of breath for 1 week. Nonproductive cough. History of lung cancer.  EXAM: CHEST  2 VIEW  COMPARISON:  CT chest 03/24/2014 ; chest radiograph 06/18/2014  FINDINGS: Anterior chest wall Port-A-Cath is present. Stable cardiac and mediastinal contours. Interval development of masslike opacity within the left mid lung. Remainder of the lungs are clear. No pleural effusion or pneumothorax. Mid thoracic spine degenerative changes.  IMPRESSION: Masslike opacity within the left mid lung is nonspecific in etiology. Given history of history of cancer, recommend dedicated evaluation of the chest with contrast-enhanced CT for definitive characterization.   Electronically Signed   By: Polly Cobia.D.  On: 03/23/2015 23:45   Ct Chest W Contrast  03/24/2015   CLINICAL DATA:  Shortness of breath on exertion for 1 week. History of liver cancer, cervical cancer, bone cancer. Opacity in the left  lung on chest radiograph.  EXAM: CT CHEST WITH CONTRAST  TECHNIQUE: Multidetector CT imaging of the chest was performed during intravenous contrast administration.  CONTRAST:  25m OMNIPAQUE IOHEXOL 300 MG/ML  SOLN  COMPARISON:  Chest radiograph 03/23/2015.  CT chest 03/24/2014.  FINDINGS: Corresponding to the chest x-ray abnormality, there is a nodule in the left mid lung measuring 2.4 by 3 cm diameter. This is suspicious for bronchogenic carcinoma. There is left hilar lymphadenopathy measuring 3 cm diameter and subcarinal lymphadenopathy measuring up to 1.9 cm diameter. Enlarged right peritracheal and pretracheal lymph nodes are also present. Prominent lymph nodes of the thoracic inlet.  Normal heart size. Normal caliber thoracic aorta. Esophagus is decompressed. Small subpleural nodules in the right apex appear unchanged since prior study and are probably benign. No focal consolidation in the lungs. No pleural effusions. No pneumothorax.  Included portions of the upper abdominal organs demonstrate multiple diffuse low-attenuation lesions throughout the liver, each measuring up to about a cm diameter. These were not present previously and likely represent liver metastasis. No adrenal gland nodules. Degenerative changes in the spine. No destructive bone lesions appreciated.  IMPRESSION: 2.4 x 3 cm nodule in the left mid lung with left hilar and diffuse mediastinal lymphadenopathy. 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 Multiple liver lesions likely representing liver metastasis.   Electronically Signed   By: WLucienne CapersM.D.   On: 03/24/2015 03:05        ASSESSMENT/PLAN   69yo white female with acute COPD exacerbation with New Left Lung mass with adenopathy-likely primary lung cancer  1.will proceed with EBUS/ENB for Thursday Morning at 1000AM 2.continue inhaled meds  3.continue prednisone and ABX as prescribed    I have personally obtained a history, examined the  patient, evaluated laboratory and independently reviewed imaging results, formulated the assessment and plan and placed orders.  The Patient requires high complexity decision making for assessment and support, frequent evaluation and titration of therapies, application of advanced monitoring technologies and extensive interpretation of multiple databases.\  Patient is satisfied with Plan of action and management.    KCorrin Parker M.D.  LVel[facility] HecklerPulmonary & Critical Care Medicine  Medical Director [place]Director [facility]ess[facility] Rehabilitation[facility]io-Pulmonary Department

## 2015-03-29 NOTE — Discharge Instructions (Signed)
Flexible Bronchoscopy, Care After ° °Refer to this sheet in the next few weeks. These instructions provide you with information on caring for yourself after your procedure. Your health care provider may also give you more specific instructions. Your treatment has been planned according to current medical practices, but problems sometimes occur. Call your health care provider if you have any problems or questions after your procedure.  °WHAT TO EXPECT AFTER THE PROCEDURE °It is normal to have the following symptoms for 24-48 hours after the procedure:  °· Increased cough. °· Low-grade fever. °· Sore throat or hoarse voice. °· Small streaks of blood in your thick spit (sputum) if tissue samples were taken (biopsy). °HOME CARE INSTRUCTIONS  °· Do not eat or drink anything for 2 hours after your procedure. Your nose and throat were numbed by medicine. If you try to eat or drink before the medicine wears off, food or drink could go into your lungs or you could burn yourself. After the numbness is gone and your cough and gag reflexes have returned, you may eat soft food and drink liquids slowly.   °· The day after the procedure, you can go back to your normal diet.   °· You may resume normal activities.   °· Keep all follow-up visits as directed by your health care provider. It is important to keep all your appointments, especially if tissue samples were taken for testing (biopsy). °SEEK IMMEDIATE MEDICAL CARE IF:  °· You have increasing shortness of breath.   °· You become light-headed or faint.   °· You have chest pain.   °· You have any new concerning symptoms. °· You cough up more than a small amount of blood. °· The amount of blood you cough up increases. °MAKE SURE YOU: °· Understand these instructions. °· Will watch your condition. °· Will get help right away if you are not doing well or get worse. °Document Released: 01/03/2005 Document Revised: 10/31/2013 Document Reviewed: 02/18/2013 °Exit[employer]® Patient  Information ©2015 [employer]. This information is not intended to replace advice given to you by your health care provider. Make sure you discuss any questions you have with your health care provider. ° °

## 2015-03-29 NOTE — Transfer of Care (Signed)
Immediate Anesthesia Transfer of Care Note  Patient: Sherry Prince  Procedure(s) Performed: Procedure(s): ENDOBRONCHIAL ULTRASOUND (N/A)  Patient Location: PACU  Anesthesia Type:General  Level of Consciousness: awake and patient cooperative  Airway & Oxygen Therapy: Patient Spontanous Breathing and Patient connected to face mask oxygen  Post-op Assessment: Report given to RN  Post vital signs: Reviewed and stable  Last Vitals:  Filed Vitals:   03/29/15 1206  BP: 109/80  Pulse: 86  Temp: 98.84F  Resp: 16    Complications: No apparent anesthesia complications

## 2015-03-29 NOTE — Op Note (Signed)
Electromagnetic Navigation Bronchoscopy: Indication: LEFT UPPER LOBE MASS   Risks and benefits explained in detail including risk of infection, bleeding, respiratory failure and death.   Hand washing performed prior to starting the procedure.   Type of Anesthesia: see Anesthesiology records .   Procedure Performed:  Virtual Bronchoscopy with Multi-planar Image analysis, 3-D reconstruction of coronal, sagittal and multi-planar images for the purposes of planning real-time bronchoscopy using the iLogic Electromagnetic Navigation Bronchoscopy System (superDimension)..   Description of Procedure: After obtaining informed consent from the patient, the above sedative and anesthetic measures were carried out, flexible fiberoptic bronchoscope was inserted via an oral bite block. Posterior pharynx was clear. The 2 vocal cords were easily traversed after application of local anesthetic.  The virtual camera was then placed into the central portion of the trachea. The trachea itself was inspected.  The main carina, right and left midstem bronchus and all the segmental and subsegmental airways by virtual bronchoscopy were brieftly inspected.  The camera was directed to standard registration points at the following centers: main carina, right upper lobe bronchus, right lower lobe bronchus, right middle lobe bronchus, left upper lobe bronchus, and the left lower lobe bronchus. This data was transferred to the i-Logic ENB system for real-time bronchoscopy.   Specimans Obtained:    TRANSBORNCHIAL Triple Needle Brush:x 1    Fluoroscopy:  Fluoroscopy was utilized during the course of this procedure to assure that biopsies were taken in a safe manner under fluoroscopic guidance with no spot films required.   Complications:none  Estimated Blood Loss: none  Monitoring:  The patient was monitored with continuous oximetry and received supplemental nasal cannula oxygen throughout the procedure. In addition,  serial blood pressure measurements and continuous electrocardiography showed these physiologic parameters to remain tolerable throughout the procedure.   Assessment and Plan/Additional Comments:follow up pathology reports    Corrin Parker, M.D.  [facility]  Medical Director [place] Director [facility] Cardio-Pulmonary Department

## 2015-03-29 NOTE — Anesthesia Preprocedure Evaluation (Addendum)
Anesthesia Evaluation  Patient identified by MRN, date of birth, ID band Patient awake    Reviewed: Allergy & Precautions, H&P , NPO status , Patient's Chart, lab work & pertinent test results, reviewed documented beta blocker date and time   Airway Mallampati: III  TM Distance: >3 FB Neck ROM: full    Dental  (+) Teeth Intact   Pulmonary neg pulmonary ROS, sleep apnea , Current Smoker, former smoker,    Pulmonary exam normal        Cardiovascular hypertension, +CHF  negative cardio ROS Normal cardiovascular exam Rhythm:regular Rate:Normal  Pt is describing some retrosternal chest discomfort this am. In discussion with the daughter, who is her primary care giver and who was with her in her recent hospital stay, maintains that this pain is not new and was worked up in the hospital and attributed to pressure coming from her cancer.  No other anginal equivalents are present.  EKG was reviewed.   Neuro/Psych negative neurological ROS  negative psych ROS   GI/Hepatic negative GI ROS, Neg liver ROS, GERD  ,  Endo/Other  negative endocrine ROSdiabetes  Renal/GU Renal diseasenegative Renal ROS  negative genitourinary   Musculoskeletal   Abdominal   Peds  Hematology negative hematology ROS (+)   Anesthesia Other Findings   Reproductive/Obstetrics negative OB ROS                            Anesthesia Physical Anesthesia Plan  ASA: III  Anesthesia Plan: General ETT   Post-op Pain Management:    Induction:   Airway Management Planned:   Additional Equipment:   Intra-op Plan:   Post-operative Plan:   Informed Consent: I have reviewed the patients History and Physical, chart, labs and discussed the procedure including the risks, benefits and alternatives for the proposed anesthesia with the patient or authorized representative who has indicated his/her understanding and acceptance.     Plan  Discussed with: CRNA  Anesthesia Plan Comments:         Anesthesia Quick Evaluation

## 2015-03-29 NOTE — Interval H&P Note (Signed)
History and Physical Interval Note:  03/29/2015 9:52 AM  Sherry Prince  has presented today for surgery, with the diagnosis of lung mass  The various methods of treatment have been discussed with the patient and family. After consideration of risks, benefits and other options for treatment, the patient has consented to  Procedure(s): ENDOBRONCHIAL ULTRASOUND (N/A) and ENB as a surgical intervention .  The patient's history has been reviewed, patient examined, no change in status, stable for surgery.  I have reviewed the patient's chart and labs.  Questions were answered to the patient's satisfaction.     Flora Lipps

## 2015-03-29 NOTE — Anesthesia Procedure Notes (Signed)
Procedure Name: Intubation Date/Time: 03/29/2015 10:16 AM Performed by: Dionne Bucy Pre-anesthesia Checklist: Patient identified, Patient being monitored, Timeout performed, Emergency Drugs available and Suction available Patient Re-evaluated:Patient Re-evaluated prior to inductionOxygen Delivery Method: Circle system utilized Preoxygenation: Pre-oxygenation with 100% oxygen Intubation Type: IV induction Ventilation: Mask ventilation without difficulty Laryngoscope Size: Mac and 3 Grade View: Grade I Tube type: Oral Tube size: 8.5 mm Number of attempts: 1 Airway Equipment and Method: Stylet Placement Confirmation: ETT inserted through vocal cords under direct vision,  positive ETCO2 and breath sounds checked- equal and bilateral Secured at: 22 cm Tube secured with: Tape Dental Injury: Teeth and Oropharynx as per pre-operative assessment

## 2015-03-29 NOTE — Op Note (Signed)
PROCEDURE: ENDOBRONCHIAL ULTRASOUND   PROCEDURE DATE: 03/29/2015  TIME:  NAME:  Sherry Prince  DOB:06-26-1946  MRN: [id number] LOC:  ARPO/None    HOSP DAY: '@LENGTHOFSTAYDAYS'$ @ CODE STATUS:        Indications/Preliminary Diagnosis:   Consent: (Place X beside choice/s below)  The benefits, risks and possible complications of the procedure were        explained to:  _x__ patient  ___ patient's family  ___ other:___________  who verbalized understanding and gave:  ___ verbal  ___ written  _x__ verbal and written  ___ telephone  ___ other:________ consent.      Unable to obtain consent; procedure performed on emergent basis.     Other:       PRESEDATION ASSESSMENT: History and Physical has been performed. Patient meds and allergies have been reviewed. Presedation airway examination has been performed and documented. Baseline vital signs, sedation score, oxygenation status, and cardiac rhythm were reviewed. Patient was deemed to be in satisfactory condition to undergo the procedure.    PREMEDICATIONS: SEE ANESTHESIOLOGY RECORDS   Airway Prep (Place X beside choice below)   1% Transtracheal Lidocaine Anesthetization 7 cc   Patient prepped per Bronchoscopy Lab Policy       Insertion Route (Place X beside choice below)   Nasal   Oral  x Endotracheal Tube   Tracheostomy   INTRAPROCEDURE MEDICATIONS: SEE ANESTHESIOLOGY RECORDS   PROCEDURE DETAILS: Timeout performed and correct patient, name, & ID confirmed. Following prep per Pulmonary policy, appropriate sedation was administered. The EndoBronchial [place] scope was inserted into Airway,  exam proceeded with findings, technical procedures, and specimen collection as noted below. At the end of exam the scope was withdrawn without incident. Impression and Plan as noted below.       TECHNICAL PROCEDURES: (Place X beside choice below)   Procedures  Description    None     Electrocautery     Cryotherapy     Balloon Dilatation    Bronchography     Stent Placement     Therapeutic Aspiration     Laser/Argon Plasma    Brachytherapy Catheter Placement    Foreign Body Removal     SPECIMENS (Sites): (Place X beside choice below)  Specimens Description   No Specimens Obtained     Washings    Lavage    Biopsies   x Fine Needle Aspirates FNA x 3 of Left Hilar FNA x 5 of Subcarinal mass   Brushings    Sputum    FINDINGS: Very large 4x5 CM subcarinal mass visualized under [place].  Left Hilar mass also visualized apporx 3x3CM  ESTIMATED BLOOD LOSS: none COMPLICATIONS/RESOLUTION: none       IMPRESSION:POST-PROCEDURE DX: await pathology recs    RECOMMENDATION/PLAN: needs oncology refrerral     Corrin Parker, M.D.  [facility]  Medical Director [place] Director [place] Department

## 2015-03-30 LAB — CYTOLOGY - NON PAP

## 2015-04-02 ENCOUNTER — Encounter: Payer: Self-pay | Admitting: Pain Medicine

## 2015-04-02 ENCOUNTER — Ambulatory Visit: Payer: Medicare Other | Attending: Pain Medicine | Admitting: Pain Medicine

## 2015-04-02 VITALS — BP 104/62 | HR 75 | Temp 98.6°F | Resp 16 | Ht 65.0 in | Wt 260.0 lb

## 2015-04-02 DIAGNOSIS — M545 Low back pain: Secondary | ICD-10-CM | POA: Insufficient documentation

## 2015-04-02 DIAGNOSIS — M5136 Other intervertebral disc degeneration, lumbar region: Secondary | ICD-10-CM

## 2015-04-02 DIAGNOSIS — M5481 Occipital neuralgia: Secondary | ICD-10-CM

## 2015-04-02 DIAGNOSIS — M79605 Pain in left leg: Secondary | ICD-10-CM | POA: Diagnosis present

## 2015-04-02 DIAGNOSIS — M170 Bilateral primary osteoarthritis of knee: Secondary | ICD-10-CM

## 2015-04-02 DIAGNOSIS — M179 Osteoarthritis of knee, unspecified: Secondary | ICD-10-CM | POA: Diagnosis not present

## 2015-04-02 DIAGNOSIS — M79604 Pain in right leg: Secondary | ICD-10-CM | POA: Diagnosis present

## 2015-04-02 MED ORDER — MIDAZOLAM HCL 5 MG/5ML IJ SOLN: 5.0000 mg | Freq: Once | INTRAMUSCULAR | Status: DC

## 2015-04-02 MED ORDER — BUPIVACAINE HCL (PF) 0.25 % IJ SOLN
30.0000 mL | Freq: Once | INTRAMUSCULAR | Status: AC
  Administered 2015-04-02: 6 mL

## 2015-04-02 MED ORDER — LIDOCAINE HCL (PF) 1 % IJ SOLN
10.0000 mL | Freq: Once | INTRAMUSCULAR | Status: AC
  Administered 2015-04-02: 12 mL via SUBCUTANEOUS

## 2015-04-02 MED ORDER — SODIUM CHLORIDE 0.9 % IJ SOLN: 20.0000 mL | Freq: Once | INTRAMUSCULAR | Status: DC

## 2015-04-02 MED ORDER — TRIAMCINOLONE ACETONIDE 40 MG/ML IJ SUSP
INTRAMUSCULAR | Status: AC
  Administered 2015-04-02: 10 mg
  Filled 2015-04-02: qty 1

## 2015-04-02 MED ORDER — FENTANYL CITRATE (PF) 100 MCG/2ML IJ SOLN: 100.0000 ug | Freq: Once | INTRAMUSCULAR | Status: DC

## 2015-04-02 MED ORDER — TRIAMCINOLONE ACETONIDE 40 MG/ML IJ SUSP
40.0000 mg | Freq: Once | INTRAMUSCULAR | Status: AC
  Administered 2015-04-02: 10 mg

## 2015-04-02 MED ORDER — FENTANYL CITRATE (PF) 100 MCG/2ML IJ SOLN
INTRAMUSCULAR | Status: AC
Start: 2015-04-02 — End: 2015-04-02
  Administered 2015-04-02: 50 ug via INTRAVENOUS
  Filled 2015-04-02: qty 2

## 2015-04-02 MED ORDER — MIDAZOLAM HCL 5 MG/5ML IJ SOLN
INTRAMUSCULAR | Status: AC
  Administered 2015-04-02: 2 mg via INTRAVENOUS
  Filled 2015-04-02: qty 5

## 2015-04-02 MED ORDER — BUPIVACAINE HCL (PF) 0.25 % IJ SOLN
INTRAMUSCULAR | Status: AC
  Administered 2015-04-02: 6 mL
  Filled 2015-04-02: qty 30

## 2015-04-02 MED ORDER — LIDOCAINE HCL (PF) 1 % IJ SOLN
INTRAMUSCULAR | Status: AC
  Administered 2015-04-02: 12 mL via SUBCUTANEOUS
  Filled 2015-04-02: qty 5

## 2015-04-02 NOTE — Progress Notes (Signed)
Safety precautions to be maintained throughout the outpatient stay will include: orient to surroundings, keep bed in low position, maintain call bell within reach at all times, provide assistance with transfer out of bed and ambulation.  

## 2015-04-02 NOTE — Patient Instructions (Addendum)
PLAN   Continue present medication  F/U PCP  Revelo  for evaliation of  BP and general medical  condition  F/U surgical evaluation. May consider pending follow-up evaluations  F/U neurological evaluation. May consider pending follow-up evaluations  May consider radiofrequency rhizolysis or intraspinal procedures pending response to present treatment and F/U evaluation   Patient to call [facility] should patient have concerns prior to scheduled return appointment. Knee Injection Joint injections are shots. Your caregiver will place a needle into your knee joint. The needle is used to put medicine into the joint. These shots can be used to help treat different painful knee conditions such as osteoarthritis, bursitis, local flare-ups of rheumatoid arthritis, and pseudogout. Anti-inflammatory medicines such as corticosteroids and anesthetics are the most common medicines used for joint and soft tissue injections.  PROCEDURE  The skin over the kneecap will be cleaned with an antiseptic solution.  Your caregiver will inject a small amount of a local anesthetic (a medicine like Novocaine) just under the skin in the area that was cleaned.  After the area becomes numb, a second injection is done. This second injection usually includes an anesthetic and an anti-inflammatory medicine called a steroid or cortisone. The needle is carefully placed in between the kneecap and the knee, and the medicine is injected into the joint space.  After the injection is done, the needle is removed. Your caregiver may place a bandage over the injection site. The whole procedure takes no more than a couple of minutes. BEFORE THE PROCEDURE  Wash all of the skin around the entire knee area. Try to remove any loose, scaling skin. There is no other specific preparation necessary unless advised otherwise by your caregiver. LET YOUR CAREGIVER KNOW ABOUT:   Allergies.  Medications taken including herbs, eye  drops, over the counter medications, and creams.  Use of steroids (by mouth or creams).  Possible pregnancy, if applicable.  Previous problems with anesthetics or Novocaine.  History of blood clots (thrombophlebitis).  History of bleeding or blood problems.  Previous surgery.  Other health problems. RISKS AND COMPLICATIONS Side effects from cortisone shots are rare. They include:   Slight bruising of the skin.  Shrinkage of the normal fatty tissue under the skin where the shot was given.  Increase in pain after the shot.  Infection.  Weakening of tendons or tendon rupture.  Allergic reaction to the medicine.  Diabetics may have a temporary increase in their blood sugar after a shot.  Cortisone can temporarily weaken the immune system. While receiving these shots, you should not get certain vaccines. Also, avoid contact with anyone who has chickenpox or measles. Especially if you have never had these diseases or have not been previously immunized. Your immune system may not be strong enough to fight off the infection while the cortisone is in your system. AFTER THE PROCEDURE   You can go home after the procedure.  You may need to put ice on the joint 15-20 minutes every 3 or 4 hours until the pain goes away.  You may need to put an elastic bandage on the joint. HOME CARE INSTRUCTIONS   Only take over-the-counter or prescription medicines for pain, discomfort, or fever as directed by your caregiver.  You should avoid stressing the joint. Unless advised otherwise, avoid activities that put a lot of pressure on a knee joint, such as:  Jogging.  Bicycling.  Recreational climbing.  Hiking.  Laying down and elevating the leg/knee above the level of  your heart can help to minimize swelling. SEEK MEDICAL CARE IF:   You have repeated or worsening swelling.  There is drainage from the puncture area.  You develop red streaking that extends above or below the site  where the needle was inserted. SEEK IMMEDIATE MEDICAL CARE IF:   You develop a fever.  You have pain that gets worse even though you are taking pain medicine.  The area is red and warm, and you have trouble moving the joint. MAKE SURE YOU:   Understand these instructions.  Will watch your condition.  Will get help right away if you are not doing well or get worse. Document Released: 09/07/2006 Document Revised: 09/08/2011 Document Reviewed: 06/04/2007 [facility] Patient Information 2015 [place], [place]. This information is not intended to replace advice given to you by your health care provider. Make sure you discuss any questions you have with your health care provider. Pain Management Discharge Instructions  General Discharge Instructions :  If you need to reach your doctor call: Monday-Friday 8:00 am - 4:00 pm at [phone] or toll free [phone].  After clinic hours [phone] to have operator reach doctor.  Bring all of your medication bottles to all your appointments in the pain clinic.  To cancel or reschedule your appointment with Pain Management please remember to call 24 hours in advance to avoid a fee.  Refer to the educational materials which you have been given on: General Risks, I had my Procedure. Discharge Instructions, Post Sedation.  Post Procedure Instructions:  The drugs you were given will stay in your system until tomorrow, so for the next 24 hours you should not drive, make any legal decisions or drink any alcoholic beverages.  You may eat anything you prefer, but it is better to start with liquids then soups and crackers, and gradually work up to solid foods.  Please notify your doctor immediately if you have any unusual bleeding, trouble breathing or pain that is not related to your normal pain.  Depending on the type of procedure that was done, some parts of your body may feel week and/or numb.  This usually clears up by tonight or the next  day.  Walk with the use of an assistive device or accompanied by an adult for the 24 hours.  You may use ice on the affected area for the first 24 hours.  Put ice in a Ziploc bag and cover with a towel and place against area 15 minutes on 15 minutes off.  You may switch to heat after 24 hours.

## 2015-04-02 NOTE — Anesthesia Postprocedure Evaluation (Signed)
  Anesthesia Post-op Note  Patient: Sherry Prince  Procedure(s) Performed: Procedure(s): ENDOBRONCHIAL ULTRASOUND (N/A)  Anesthesia type:General ETT  Patient location: PACU  Post pain: Pain level controlled  Post assessment: Post-op Vital signs reviewed, Patient's Cardiovascular Status Stable, Respiratory Function Stable, Patent Airway and No signs of Nausea or vomiting  Post vital signs: Reviewed and stable  Last Vitals:  Filed Vitals:   03/29/15 1315  BP: 125/83  Pulse: 81  Temp: 36.3 C  Resp: 16    Level of consciousness: awake, alert  and patient cooperative  Complications: No apparent anesthesia complications

## 2015-04-02 NOTE — Progress Notes (Signed)
Subjective:    Patient ID: Sherry Prince, female    DOB: [birth year], 69 y.o.   MRN: [id number]  HPI  Geniculate nerve blocks of the right knee   The patient is a 69 y.o. female who returns to the [facility] for further evaluation and treatment of pain involving the lumbar lower extremity region with severe pain of the right knee. Prior studies reveal patient to be with significant degenerative joint disease of the knee.  We will proceed with geniculate nerve blocks of the right knee in an attempt to decrease severity of symptoms, minimize the risk of medication escalation, hopefully retard progression of symptoms and avoid the need for more involved treatment.  The risks benefits and expectations of the procedure were discussed with the patient. The patient was with understanding and in agreement with suggested treatment plan.  DESCRIPTION OF PROCEDURE: Geniculate nerve blocks of the right knee. The  procedure was performed with IV Versed and IV fentanyl, conscious sedation and under fluoroscopic guidance.  NEEDLE PLACEMENT FOR BLOCK OF THE LATERAL SUPERIOR GENICULATE NERVE: The patient was taking to the fluoroscopy suite. With the patient supine, with knee in flexed position, Betadine prep of proposed entry site accomplished.  IV Versed, IV fentanyl conscious sedation, EKG, blood pressure, pulse and pulse oximetry monitoring were all in place. Under fluoroscopic guidance, a 22-gauge needle was inserted in the region of the right knee with needle placed at the lateral border of the femur at the junction of the shaft of the femur and the condyle of the femur.  Following needle placement at the lateral aspect of the knee, needle placement was then accomplished in the region of the medial aspect of the knee.  NEEDLE PLACEMENT FOR BLOCK OF THE MEDIAL SUPERIOR GENICULATE NERVE:  Under fluoroscopic guidance, a 22 - gauge needle was inserted in the region of the right knee with needle placed at  the medial border of the femur at the junction of the shaft of the femur and the condyle of the femur.   NEEDLE PLACEMENT FOR BLOCK OF THE MEDIAL INFERIOR GENICULATE NERVE:  Under fluoroscopic guidance, a 22 - gauge needle was inserted in the region of the right knee with needle placed at the junction of the shaft and plateau of the tibia.   Following needle placement on AP view of needles placed in all three locations, placement was then verified on lateral view with the tips of the superior lateral and superior medial needles documented to be one half the distance of the shaft of the femur and the tip of the inferior medial geniculate needle documented to be one half the distance of the shaft of the tibia.  Following documentation of needle placements on lateral view, each needle was injected with one mL of 0.25% bupivacaine with Kenalog. A total of 10 mg of Kenalog was utilized for the entire procedure. The patient tolerated the procedure well.    PLAN 1. Medications: Continue present medications 2. Follow-up appointment with PCP Dr.Revelo   for evaluation of blood pressure and general medical condition. 3. Follow-up surgical evaluation Patient will undergo follow-up surgical evaluation as discussed 4. Follow-up neurological evaluation to be considered  5. He patient may be a candidate for radiofrequency rhizolysis and other treatment pending response to treatment on today's visit and follow-up evaluation. 6. The patient is advised to adhere to proper body mechanics and avoid activities which appear to aggravate condition   Review of Systems     Objective:  Physical Exam        Assessment & Plan:

## 2015-04-03 ENCOUNTER — Telehealth: Payer: Self-pay | Admitting: *Deleted

## 2015-04-03 LAB — CYTOLOGY - NON PAP

## 2015-04-03 NOTE — Telephone Encounter (Signed)
Patient denies problems or concerns from procedure

## 2015-04-04 ENCOUNTER — Encounter: Payer: Self-pay | Admitting: Oncology

## 2015-04-04 ENCOUNTER — Inpatient Hospital Stay: Payer: Medicare Other | Attending: Oncology | Admitting: Oncology

## 2015-04-04 VITALS — BP 113/65 | HR 77 | Temp 98.1°F | Wt 254.0 lb

## 2015-04-04 DIAGNOSIS — K219 Gastro-esophageal reflux disease without esophagitis: Secondary | ICD-10-CM | POA: Insufficient documentation

## 2015-04-04 DIAGNOSIS — R531 Weakness: Secondary | ICD-10-CM | POA: Diagnosis not present

## 2015-04-04 DIAGNOSIS — Z5111 Encounter for antineoplastic chemotherapy: Secondary | ICD-10-CM | POA: Diagnosis not present

## 2015-04-04 DIAGNOSIS — C3402 Malignant neoplasm of left main bronchus: Secondary | ICD-10-CM | POA: Diagnosis not present

## 2015-04-04 DIAGNOSIS — Z87891 Personal history of nicotine dependence: Secondary | ICD-10-CM | POA: Diagnosis not present

## 2015-04-04 DIAGNOSIS — R5381 Other malaise: Secondary | ICD-10-CM | POA: Diagnosis not present

## 2015-04-04 DIAGNOSIS — Z8505 Personal history of malignant neoplasm of liver: Secondary | ICD-10-CM | POA: Diagnosis not present

## 2015-04-04 DIAGNOSIS — R05 Cough: Secondary | ICD-10-CM | POA: Diagnosis not present

## 2015-04-04 DIAGNOSIS — Z853 Personal history of malignant neoplasm of breast: Secondary | ICD-10-CM | POA: Insufficient documentation

## 2015-04-04 DIAGNOSIS — N189 Chronic kidney disease, unspecified: Secondary | ICD-10-CM | POA: Diagnosis not present

## 2015-04-04 DIAGNOSIS — Z794 Long term (current) use of insulin: Secondary | ICD-10-CM | POA: Insufficient documentation

## 2015-04-04 DIAGNOSIS — G4730 Sleep apnea, unspecified: Secondary | ICD-10-CM | POA: Diagnosis not present

## 2015-04-04 DIAGNOSIS — C3492 Malignant neoplasm of unspecified part of left bronchus or lung: Secondary | ICD-10-CM

## 2015-04-04 DIAGNOSIS — C787 Secondary malignant neoplasm of liver and intrahepatic bile duct: Secondary | ICD-10-CM | POA: Diagnosis not present

## 2015-04-04 DIAGNOSIS — R590 Localized enlarged lymph nodes: Secondary | ICD-10-CM | POA: Insufficient documentation

## 2015-04-04 DIAGNOSIS — K769 Liver disease, unspecified: Secondary | ICD-10-CM | POA: Diagnosis not present

## 2015-04-04 DIAGNOSIS — I129 Hypertensive chronic kidney disease with stage 1 through stage 4 chronic kidney disease, or unspecified chronic kidney disease: Secondary | ICD-10-CM | POA: Insufficient documentation

## 2015-04-04 DIAGNOSIS — E119 Type 2 diabetes mellitus without complications: Secondary | ICD-10-CM | POA: Diagnosis not present

## 2015-04-04 DIAGNOSIS — Z8541 Personal history of malignant neoplasm of cervix uteri: Secondary | ICD-10-CM | POA: Insufficient documentation

## 2015-04-04 DIAGNOSIS — Z9221 Personal history of antineoplastic chemotherapy: Secondary | ICD-10-CM | POA: Diagnosis not present

## 2015-04-04 DIAGNOSIS — Z9011 Acquired absence of right breast and nipple: Secondary | ICD-10-CM | POA: Diagnosis not present

## 2015-04-04 DIAGNOSIS — R5383 Other fatigue: Secondary | ICD-10-CM | POA: Diagnosis not present

## 2015-04-04 DIAGNOSIS — R110 Nausea: Secondary | ICD-10-CM | POA: Diagnosis not present

## 2015-04-04 DIAGNOSIS — R197 Diarrhea, unspecified: Secondary | ICD-10-CM | POA: Diagnosis not present

## 2015-04-04 DIAGNOSIS — Z85830 Personal history of malignant neoplasm of bone: Secondary | ICD-10-CM

## 2015-04-04 DIAGNOSIS — Z79899 Other long term (current) drug therapy: Secondary | ICD-10-CM | POA: Insufficient documentation

## 2015-04-04 DIAGNOSIS — J449 Chronic obstructive pulmonary disease, unspecified: Secondary | ICD-10-CM

## 2015-04-04 MED ORDER — TRAZODONE HCL 50 MG PO TABS: 50.0000 mg | ORAL_TABLET | Freq: Every day | ORAL | Status: DC

## 2015-04-04 NOTE — Progress Notes (Signed)
Patient recently hospitalized for COPD exacerbation.  Discharged last week and referred while inpatient

## 2015-04-06 NOTE — Progress Notes (Signed)
Sherry Prince  [phone] [phone]) [phone]  ID: Sherry Prince OB: May 23, 1946  MR#: [id number]  ZDG#:[id number]  Patient Care Team: Theotis Burrow, MD as PCP - General (Family Medicine)  CHIEF COMPLAINT: History of stage IV breast cancer, now with small cell lung cancer.   INTERVAL HISTORY: Patient is a 69 year old female with history of stage IV breast cancer treated at [facility] who recently presented to the emergency room with increasing shortness of breath. Subsequent workup included CT of the chest which revealed a lung mass highly suspicious for malignancy. Biopsy was consistent with small cell lung cancer. Patient currently feels well and back to her baseline. She does not complain of any further shortness of breath. She has no neurologic complaints. She denies any recent fevers. She denies any chest pain. She has a good appetite and denies weight loss. She denies any pain. She has no nausea, vomiting, constipation, or diarrhea. She has no urinary complaints. Patient offers no specific complaints today.  REVIEW OF SYSTEMS:   Review of Systems  Constitutional: Negative for malaise/fatigue.  Respiratory: Positive for cough and shortness of breath.   Cardiovascular: Negative.   Gastrointestinal: Negative.   Genitourinary: Negative.   Musculoskeletal: Negative.   Neurological: Negative.  Negative for weakness.    As per HPI. Otherwise, a complete review of systems is negatve.  PAST MEDICAL HISTORY: Past Medical History  Diagnosis Date  . Diabetes mellitus without complication ([place])   . Chronic kidney disease   . GERD (gastroesophageal reflux disease)   . Hypertension   . Ear pain   . Serum calcium elevated   . Floaters   . Sleep apnea   . Cancer ([place])   . Liver cancer ([place])   . Cervical cancer ([place])   . Bone cancer ([place])   . CHF (congestive heart failure) ([place])   . COPD (chronic obstructive pulmonary disease) ([place])     PAST SURGICAL  HISTORY: Past Surgical History  Procedure Laterality Date  . Abdominal hysterectomy    . Breast surgery    . Eye surgery    . Endobronchial ultrasound N/A 03/29/2015    Procedure: ENDOBRONCHIAL ULTRASOUND;  Surgeon: Flora Lipps, MD;  Location: [facility] ORS;  Service: Cardiopulmonary;  Laterality: N/A;    FAMILY HISTORY Family History  Problem Relation Age of Onset  . Cancer Father        ADVANCED DIRECTIVES:    HEALTH MAINTENANCE: Social History  Substance Use Topics  . Smoking status: Former Research scientist (life sciences)  . Smokeless tobacco: Not on file  . Alcohol Use: No     Colonoscopy:  PAP:  Bone density:  Lipid panel:  Allergies  Allergen Reactions  . Penicillins Hives    Current Outpatient Prescriptions  Medication Sig Dispense Refill  . albuterol (PROVENTIL) (2.5 MG/3ML) 0.083% nebulizer solution Take 2.5 mg by nebulization every 2 (two) hours as needed for wheezing or shortness of breath.    . ARTIFICIAL TEARS 0.1-0.3 % SOLN Place 1 drop into both eyes as needed (dry eyes).     Marland Kitchen atorvastatin (LIPITOR) 40 MG tablet Take 40 mg by mouth daily.    . budesonide-formoterol (SYMBICORT) 160-4.5 MCG/ACT inhaler Inhale 2 puffs into the lungs 2 (two) times daily.    Marland Kitchen buPROPion (WELLBUTRIN SR) 100 MG 12 hr tablet Take 100 mg by mouth daily.    . citalopram (CELEXA) 40 MG tablet Take 40 mg by mouth every morning.    . docusate sodium (COLACE) 100 MG capsule Take 1  capsule (100 mg total) by mouth 2 (two) times daily. 10 capsule 0  . Doxylamine-DM 6.25-15 MG/15ML LIQD Take 1 Dose by mouth every 6 (six) hours as needed (cough).    . fluticasone (FLONASE) 50 MCG/ACT nasal spray Place 1 spray into the nose daily as needed for allergies or rhinitis.     . furosemide (LASIX) 40 MG tablet Take 1 tablet (40 mg total) by mouth daily. 30 tablet 0  . gabapentin (NEURONTIN) 300 MG capsule Take 300 mg by mouth at bedtime.    Marland Kitchen glipiZIDE (GLUCOTROL) 5 MG tablet Take 5 mg by mouth daily before breakfast.    .  HYDROcodone-acetaminophen (NORCO/VICODIN) 5-325 MG tablet Take 1-2 tablets by mouth every 6 (six) hours as needed for moderate pain or severe pain. 20 tablet 0  . insulin detemir (LEVEMIR) 100 UNIT/ML injection Inject 35 Units into the skin at bedtime.    . isosorbide mononitrate (IMDUR) 30 MG 24 hr tablet Take 30 mg by mouth daily.    Marland Kitchen levofloxacin (LEVAQUIN) 500 MG tablet Take 1 tablet (500 mg total) by mouth daily. 4 tablet 0  . lisinopril (PRINIVIL,ZESTRIL) 10 MG tablet Take 10 mg by mouth daily.    Marland Kitchen LORazepam (ATIVAN) 1 MG tablet Take 1 mg by mouth every 8 (eight) hours as needed for anxiety.    . metoprolol tartrate (LOPRESSOR) 25 MG tablet Take 25 mg by mouth 2 (two) times daily.    Marland Kitchen omeprazole (PRILOSEC) 20 MG capsule Take 20 mg by mouth daily as needed.     . promethazine (PHENERGAN) 25 MG tablet Take 25 mg by mouth every 6 (six) hours as needed for nausea or vomiting.    . tiotropium (SPIRIVA HANDIHALER) 18 MCG inhalation capsule Place 1 capsule into inhaler and inhale daily.    . traZODone (DESYREL) 50 MG tablet Take 1 tablet (50 mg total) by mouth at bedtime. 30 tablet 2   Current Facility-Administered Medications  Medication Dose Route Frequency Provider Last Rate Last Dose  . fentaNYL (SUBLIMAZE) injection 100 mcg  100 mcg Intravenous Once Mohammed Kindle, MD      . midazolam (VERSED) 5 MG/5ML injection 5 mg  5 mg Intravenous Once Mohammed Kindle, MD      . sodium chloride 0.9 % injection 20 mL  20 mL Other Once Mohammed Kindle, MD        OBJECTIVE: Filed Vitals:   04/04/15 1122  BP: 113/65  Pulse: 77  Temp:      Body mass index is 42.26 kg/(m^2).    ECOG FS:1 - Symptomatic but completely ambulatory  General: Well-developed, well-nourished, no acute distress. Eyes: Pink conjunctiva, anicteric sclera. Breasts: Right mastectomy. Lungs: Clear to auscultation bilaterally. Heart: Regular rate and rhythm. No rubs, murmurs, or gallops. Abdomen: Soft, nontender, nondistended. No  organomegaly noted, normoactive bowel sounds. Musculoskeletal: No edema, cyanosis, or clubbing. Neuro: Alert, answering all questions appropriately. Cranial nerves grossly intact. Skin: No rashes or petechiae noted. Psych: Normal affect.   LAB RESULTS:  Lab Results  Component Value Date   NA 138 03/27/2015   K 4.0 03/27/2015   CL 102 03/27/2015   CO2 30 03/27/2015   GLUCOSE 162* 03/27/2015   BUN 31* 03/27/2015   CREATININE 1.19* 03/27/2015   CALCIUM 8.8* 03/27/2015   PROT 6.5 03/24/2014   ALBUMIN 2.9* 03/24/2014   AST 17 03/24/2014   ALT 19 03/24/2014   ALKPHOS 90 03/24/2014   BILITOT 0.3 03/24/2014   GFRNONAA 45* 03/27/2015   GFRAA 53*  03/27/2015    Lab Results  Component Value Date   WBC 9.1 03/27/2015   NEUTROABS 5.3 03/24/2015   HGB 10.4* 03/27/2015   HCT 32.4* 03/27/2015   MCV 87.6 03/27/2015   PLT 263 03/27/2015     STUDIES: Dg Chest 2 View  03/23/2015   CLINICAL DATA:  Patient with wheezing and shortness of breath for 1 week. Nonproductive cough. History of lung cancer.  EXAM: CHEST  2 VIEW  COMPARISON:  CT chest 03/24/2014 ; chest radiograph 06/18/2014  FINDINGS: Anterior chest wall Port-A-Cath is present. Stable cardiac and mediastinal contours. Interval development of masslike opacity within the left mid lung. Remainder of the lungs are clear. No pleural effusion or pneumothorax. Mid thoracic spine degenerative changes.  IMPRESSION: Masslike opacity within the left mid lung is nonspecific in etiology. Given history of history of cancer, recommend dedicated evaluation of the chest with contrast-enhanced CT for definitive characterization.   Electronically Signed   By: Lovey Newcomer M.D.   On: 03/23/2015 23:45   Ct Chest W Contrast  03/24/2015   CLINICAL DATA:  Shortness of breath on exertion for 1 week. History of liver cancer, cervical cancer, bone cancer. Opacity in the left lung on chest radiograph.  EXAM: CT CHEST WITH CONTRAST  TECHNIQUE: Multidetector CT  imaging of the chest was performed during intravenous contrast administration.  CONTRAST:  76m OMNIPAQUE IOHEXOL 300 MG/ML  SOLN  COMPARISON:  Chest radiograph 03/23/2015.  CT chest 03/24/2014.  FINDINGS: Corresponding to the chest x-ray abnormality, there is a nodule in the left mid lung measuring 2.4 by 3 cm diameter. This is suspicious for bronchogenic carcinoma. There is left hilar lymphadenopathy measuring 3 cm diameter and subcarinal lymphadenopathy measuring up to 1.9 cm diameter. Enlarged right peritracheal and pretracheal lymph nodes are also present. Prominent lymph nodes of the thoracic inlet.  Normal heart size. Normal caliber thoracic aorta. Esophagus is decompressed. Small subpleural nodules in the right apex appear unchanged since prior study and are probably benign. No focal consolidation in the lungs. No pleural effusions. No pneumothorax.  Included portions of the upper abdominal organs demonstrate multiple diffuse low-attenuation lesions throughout the liver, each measuring up to about a cm diameter. These were not present previously and likely represent liver metastasis. No adrenal gland nodules. Degenerative changes in the spine. No destructive bone lesions appreciated.  IMPRESSION: 2.4 x 3 cm nodule in the left mid lung with left hilar and diffuse mediastinal lymphadenopathy. 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 Multiple liver lesions likely representing liver metastasis.   Electronically Signed   By: WLucienne CapersM.D.   On: 03/24/2015 03:05   Dg C-arm 1-60 Min-no Report  03/29/2015   CLINICAL DATA: procedure   C-ARM 1-60 MINUTES  Fluoroscopy was utilized by the requesting physician.  No radiographic  interpretation.     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  PLAN:    1. History of breast cancer: Patient was diagnosed with a stage I breast cancer in 2005 which was ER/PR negative,  HER-2 positive. She had a right mastectomy and chemotherapy at that time with dose dense Adriamycin and Cytoxan 4 without Herceptin. She did not have radiation. She subsequently had a recurrence in 2010 that was diagnosed from a sternal FNA. She was found to have involvement of her right chest wall and pectoralis muscle as well as mediastinal lymphadenopathy, lung nodules, bone, and liver lesions. She  was again given chemotherapy with Taxol, Herceptin, and Zometa between February 2010 and September 2010 with a partial response. Taxol was discontinued then she received Herceptin and Zometa from September 2010 through November and 2012. Herceptin was subsequently discontinued secondary to worsening EF. She has been maintained on Xgeva approximately every 2 months since that time. We will need to obtain more records from [facility] for further information regarding her breast cancer treatments. Patient's most recent CA-27-29 on on March 24, 2015 is 22.3. 2. Small cell lung cancer: CT scan and pathology results reviewed independently confirming second primary. Will get a PET scan as well as MRI of the brain to complete the staging workup. Patient will then return to clinic in 1 week to discuss her imaging results and treatment planning. She will also have consultation with radiation oncology at that time.  Approximately 30 minutes was spent in discussion and consultation.  Patient expressed understanding and was in agreement with this plan. She also understands that She can call clinic at any time with any questions, concerns, or complaints.    Ernest Huger, MD   04/06/2015 5:37 PM

## 2015-04-09 ENCOUNTER — Ambulatory Visit: Payer: Medicare Other

## 2015-04-10 ENCOUNTER — Ambulatory Visit
Admission: RE | Admit: 2015-04-10 | Discharge: 2015-04-10 | Disposition: A | Discharge status: Home / Self Care | Payer: Medicare Other | Source: Ambulatory Visit | Attending: Oncology | Admitting: Oncology

## 2015-04-10 DIAGNOSIS — C787 Secondary malignant neoplasm of liver and intrahepatic bile duct: Secondary | ICD-10-CM | POA: Insufficient documentation

## 2015-04-10 DIAGNOSIS — R590 Localized enlarged lymph nodes: Secondary | ICD-10-CM | POA: Insufficient documentation

## 2015-04-10 DIAGNOSIS — G319 Degenerative disease of nervous system, unspecified: Secondary | ICD-10-CM | POA: Insufficient documentation

## 2015-04-10 DIAGNOSIS — C3492 Malignant neoplasm of unspecified part of left bronchus or lung: Secondary | ICD-10-CM

## 2015-04-10 DIAGNOSIS — C3412 Malignant neoplasm of upper lobe, left bronchus or lung: Secondary | ICD-10-CM | POA: Diagnosis present

## 2015-04-10 LAB — GLUCOSE, CAPILLARY: GLUCOSE-CAPILLARY: 124 mg/dL — AB (ref 65–99)

## 2015-04-10 MED ORDER — FLUDEOXYGLUCOSE F - 18 (FDG) INJECTION
11.4000 | Freq: Once | INTRAVENOUS | Status: DC | PRN
  Administered 2015-04-10: 11.4 via INTRAVENOUS
  Filled 2015-04-10: qty 11.4

## 2015-04-11 ENCOUNTER — Ambulatory Visit
Admission: RE | Admit: 2015-04-11 | Discharge: 2015-04-11 | Disposition: A | Discharge status: Home / Self Care | Payer: Medicare Other | Source: Ambulatory Visit | Attending: Oncology | Admitting: Oncology

## 2015-04-11 DIAGNOSIS — C3492 Malignant neoplasm of unspecified part of left bronchus or lung: Secondary | ICD-10-CM

## 2015-04-11 DIAGNOSIS — C3412 Malignant neoplasm of upper lobe, left bronchus or lung: Secondary | ICD-10-CM | POA: Diagnosis not present

## 2015-04-11 MED ORDER — GADOBENATE DIMEGLUMINE 529 MG/ML IV SOLN
20.0000 mL | Freq: Once | INTRAVENOUS | Status: AC | PRN
  Administered 2015-04-11: 20 mL via INTRAVENOUS

## 2015-04-12 ENCOUNTER — Ambulatory Visit
Admission: RE | Admit: 2015-04-12 | Discharge: 2015-04-12 | Disposition: A | Discharge status: Home / Self Care | Payer: Medicare Other | Source: Ambulatory Visit | Attending: Radiation Oncology | Admitting: Radiation Oncology

## 2015-04-12 ENCOUNTER — Encounter: Payer: Self-pay | Admitting: Radiation Oncology

## 2015-04-12 ENCOUNTER — Encounter: Payer: Self-pay | Admitting: Oncology

## 2015-04-12 ENCOUNTER — Inpatient Hospital Stay (HOSPITAL_BASED_OUTPATIENT_CLINIC_OR_DEPARTMENT_OTHER): Payer: Medicare Other | Admitting: Oncology

## 2015-04-12 VITALS — BP 109/71 | HR 77 | Temp 96.8°F | Resp 20 | Wt 258.4 lb

## 2015-04-12 VITALS — BP 106/74 | HR 85 | Temp 97.8°F | Resp 20 | Wt 257.4 lb

## 2015-04-12 DIAGNOSIS — Z853 Personal history of malignant neoplasm of breast: Secondary | ICD-10-CM | POA: Diagnosis not present

## 2015-04-12 DIAGNOSIS — J449 Chronic obstructive pulmonary disease, unspecified: Secondary | ICD-10-CM

## 2015-04-12 DIAGNOSIS — Z79899 Other long term (current) drug therapy: Secondary | ICD-10-CM

## 2015-04-12 DIAGNOSIS — Z87891 Personal history of nicotine dependence: Secondary | ICD-10-CM

## 2015-04-12 DIAGNOSIS — Z9221 Personal history of antineoplastic chemotherapy: Secondary | ICD-10-CM | POA: Diagnosis not present

## 2015-04-12 DIAGNOSIS — C787 Secondary malignant neoplasm of liver and intrahepatic bile duct: Secondary | ICD-10-CM | POA: Insufficient documentation

## 2015-04-12 DIAGNOSIS — E119 Type 2 diabetes mellitus without complications: Secondary | ICD-10-CM | POA: Insufficient documentation

## 2015-04-12 DIAGNOSIS — C3492 Malignant neoplasm of unspecified part of left bronchus or lung: Secondary | ICD-10-CM | POA: Insufficient documentation

## 2015-04-12 DIAGNOSIS — N189 Chronic kidney disease, unspecified: Secondary | ICD-10-CM | POA: Insufficient documentation

## 2015-04-12 DIAGNOSIS — I509 Heart failure, unspecified: Secondary | ICD-10-CM | POA: Insufficient documentation

## 2015-04-12 DIAGNOSIS — C3402 Malignant neoplasm of left main bronchus: Secondary | ICD-10-CM

## 2015-04-12 DIAGNOSIS — Z9011 Acquired absence of right breast and nipple: Secondary | ICD-10-CM

## 2015-04-12 DIAGNOSIS — Z8541 Personal history of malignant neoplasm of cervix uteri: Secondary | ICD-10-CM

## 2015-04-12 DIAGNOSIS — Z5111 Encounter for antineoplastic chemotherapy: Secondary | ICD-10-CM | POA: Diagnosis not present

## 2015-04-12 DIAGNOSIS — Z510 Encounter for antineoplastic radiation therapy: Secondary | ICD-10-CM | POA: Insufficient documentation

## 2015-04-12 DIAGNOSIS — I129 Hypertensive chronic kidney disease with stage 1 through stage 4 chronic kidney disease, or unspecified chronic kidney disease: Secondary | ICD-10-CM | POA: Insufficient documentation

## 2015-04-12 DIAGNOSIS — Z8505 Personal history of malignant neoplasm of liver: Secondary | ICD-10-CM

## 2015-04-12 DIAGNOSIS — Z85830 Personal history of malignant neoplasm of bone: Secondary | ICD-10-CM

## 2015-04-12 DIAGNOSIS — C771 Secondary and unspecified malignant neoplasm of intrathoracic lymph nodes: Secondary | ICD-10-CM | POA: Insufficient documentation

## 2015-04-12 DIAGNOSIS — R05 Cough: Secondary | ICD-10-CM

## 2015-04-12 NOTE — Consult Note (Signed)
Except an outstanding is perfect of Radiation Oncology NEW PATIENT EVALUATION  Name: Sherry Prince  MRN: [id number]  Date:   04/12/2015     DOB: 1946/01/20   This 69 y.o. female patient presents to the clinic for initial evaluation of extensive stage small cell lung cancer.  REFERRING PHYSICIAN: Theotis Burrow*  CHIEF COMPLAINT:  Chief Complaint  Patient presents with  . Lung Cancer    Pt is here for initial consultation of lung cancer.      DIAGNOSIS: The encounter diagnosis was Small cell lung cancer, left ([place]).   PREVIOUS INVESTIGATIONS:  CT scans PET/CT scans reviewed Pathology report reviewed Clinical notes reviewed Case presented at weekly tumor conference  HPI: Patient is a 69 year old female with history of stage IV breast cancer status post right modified radical mastectomy with adjuvant chemotherapy no radiation treated remotely at [facility]. Recently presented to the emergency room with increasing shortness of breath dyspnea on exertion and was found on chest x-ray to have a left lung mass with left hilar adenopathy. Patient underwent bronchoscopy with biopsy which was positive for small cell lung cancer. There was also extensive mediastinal left hilar subcarinal and contralateral paratracheal adenopathy noted. There are also numerous hypermetabolic masses in the liver consistent with hepatic metastasis. She is seen today for consideration of palliative radiation therapy to her chest to prevent hemoptysis, atelectasis and ultimate lung collapse. She is being seen by medical oncology for evaluation for systemic chemotherapy. MRI of her brain showed no evidence to suggest metastatic disease.  PLANNED TREATMENT REGIMEN: Palliative radiation therapy to chest  PAST MEDICAL HISTORY:  has a past medical history of Diabetes mellitus without complication ([place]); Chronic kidney disease; GERD (gastroesophageal reflux disease); Hypertension; Ear pain; Serum calcium  elevated; Floaters; Sleep apnea; Cancer ([place]); Liver cancer ([place]); Cervical cancer ([place]); Bone cancer ([facility]); CHF (congestive heart failure) ([place]); and COPD (chronic obstructive pulmonary disease) ([place]).    PAST SURGICAL HISTORY:  Past Surgical History  Procedure Laterality Date  . Abdominal hysterectomy    . Breast surgery    . Eye surgery    . Endobronchial ultrasound N/A 03/29/2015    Procedure: ENDOBRONCHIAL ULTRASOUND;  Surgeon: Flora Lipps, MD;  Location: [facility] ORS;  Service: Cardiopulmonary;  Laterality: N/A;    FAMILY HISTORY: family history includes Cancer in her father.  SOCIAL HISTORY:  reports that she has quit smoking. She does not have any smokeless tobacco history on file. She reports that she does not drink alcohol or use illicit drugs.  ALLERGIES: Peanut oil and Penicillins  MEDICATIONS:  Current Outpatient Prescriptions  Medication Sig Dispense Refill  . albuterol (PROVENTIL) (2.5 MG/3ML) 0.083% nebulizer solution Take 2.5 mg by nebulization every 2 (two) hours as needed for wheezing or shortness of breath.    . ARTIFICIAL TEARS 0.1-0.3 % SOLN Place 1 drop into both eyes as needed (dry eyes).     Marland Kitchen atorvastatin (LIPITOR) 40 MG tablet Take 40 mg by mouth daily.    . budesonide-formoterol (SYMBICORT) 160-4.5 MCG/ACT inhaler Inhale 2 puffs into the lungs 2 (two) times daily.    Marland Kitchen buPROPion (WELLBUTRIN SR) 100 MG 12 hr tablet Take 100 mg by mouth daily.    . citalopram (CELEXA) 40 MG tablet Take 40 mg by mouth every morning.    . docusate sodium (COLACE) 100 MG capsule Take 1 capsule (100 mg total) by mouth 2 (two) times daily. 10 capsule 0  . Doxylamine-DM 6.25-15 MG/15ML LIQD Take 1 Dose by mouth  every 6 (six) hours as needed (cough).    . fluticasone (FLONASE) 50 MCG/ACT nasal spray Place 1 spray into the nose daily as needed for allergies or rhinitis.     . furosemide (LASIX) 40 MG tablet Take 1 tablet (40 mg total) by mouth daily. 30 tablet 0  . gabapentin (NEURONTIN)  300 MG capsule Take 300 mg by mouth at bedtime.    Marland Kitchen glipiZIDE (GLUCOTROL) 5 MG tablet Take 5 mg by mouth daily before breakfast.    . HYDROcodone-acetaminophen (NORCO/VICODIN) 5-325 MG tablet Take 1-2 tablets by mouth every 6 (six) hours as needed for moderate pain or severe pain. 20 tablet 0  . insulin detemir (LEVEMIR) 100 UNIT/ML injection Inject 35 Units into the skin at bedtime.    . isosorbide mononitrate (IMDUR) 30 MG 24 hr tablet Take 30 mg by mouth daily.    Marland Kitchen levofloxacin (LEVAQUIN) 500 MG tablet Take 1 tablet (500 mg total) by mouth daily. 4 tablet 0  . lisinopril (PRINIVIL,ZESTRIL) 10 MG tablet Take 10 mg by mouth daily.    Marland Kitchen LORazepam (ATIVAN) 1 MG tablet Take 1 mg by mouth every 8 (eight) hours as needed for anxiety.    . metFORMIN (GLUCOPHAGE) 1000 MG tablet     . metoprolol tartrate (LOPRESSOR) 25 MG tablet Take 25 mg by mouth 2 (two) times daily.    Marland Kitchen omeprazole (PRILOSEC) 20 MG capsule Take 20 mg by mouth daily as needed.     . ondansetron (ZOFRAN) 4 MG tablet     . predniSONE (DELTASONE) 10 MG tablet     . promethazine (PHENERGAN) 25 MG tablet Take 25 mg by mouth every 6 (six) hours as needed for nausea or vomiting.    . tiotropium (SPIRIVA HANDIHALER) 18 MCG inhalation capsule Place 1 capsule into inhaler and inhale daily.    . traMADol (ULTRAM) 50 MG tablet Take 50 mg by mouth.    . traZODone (DESYREL) 50 MG tablet Take 1 tablet (50 mg total) by mouth at bedtime. 30 tablet 2   Current Facility-Administered Medications  Medication Dose Route Frequency Provider Last Rate Last Dose  . fentaNYL (SUBLIMAZE) injection 100 mcg  100 mcg Intravenous Once Mohammed Kindle, MD      . midazolam (VERSED) 5 MG/5ML injection 5 mg  5 mg Intravenous Once Mohammed Kindle, MD      . sodium chloride 0.9 % injection 20 mL  20 mL Other Once Mohammed Kindle, MD       Facility-Administered Medications Ordered in Other Encounters  Medication Dose Route Frequency Provider Last Rate Last Dose  .  fludeoxyglucose F - 18 (FDG) injection 11.4 milli Curie  11.4 milli Curie Intravenous Once PRN Medication Radiologist, MD   11.4 milli Curie at 04/10/15 0757    ECOG PERFORMANCE STATUS:  2 - Symptomatic, <50% confined to bed  REVIEW OF SYSTEMS: Except for mild nonproductive cough and the extreme shortness of breath and dyspnea on exertion Patient denies any weight loss, fatigue, weakness, fever, chills or night sweats. Patient denies any loss of vision, blurred vision. Patient denies any ringing  of the ears or hearing loss. No irregular heartbeat. Patient denies heart murmur or history of fainting. Patient denies any chest pain or pain radiating to her upper extremities. Patient denies any shortness of breath, difficulty breathing at night, cough or hemoptysis. Patient denies any swelling in the lower legs. Patient denies any nausea vomiting, vomiting of blood, or coffee ground material in the vomitus. Patient denies any stomach  pain. Patient states has had normal bowel movements no significant constipation or diarrhea. Patient denies any dysuria, hematuria or significant nocturia. Patient denies any problems walking, swelling in the joints or loss of balance. Patient denies any skin changes, loss of hair or loss of weight. Patient denies any excessive worrying or anxiety or significant depression. Patient denies any problems with insomnia. Patient denies excessive thirst, polyuria, polydipsia. Patient denies any swollen glands, patient denies easy bruising or easy bleeding. Patient denies any recent infections, allergies or URI. Patient "s visual fields have not changed significantly in recent time.    PHYSICAL EXAM: BP 106/74 mmHg  Pulse 85  Temp(Src) 97.8 F (36.6 C)  Resp 20  Wt 257 lb 6.2 oz (116.75 kg) Well-developed obese female wheelchair-bound in NAD. Well-developed well-nourished patient in NAD. HEENT reveals PERLA, EOMI, discs not visualized.  Oral cavity is clear. No oral mucosal  lesions are identified. Neck is clear without evidence of cervical or supraclavicular adenopathy. Lungs are clear to A&P. Cardiac examination is essentially unremarkable with regular rate and rhythm without murmur rub or thrill. Abdomen is benign with no organomegaly or masses noted. Motor sensory and DTR levels are equal and symmetric in the upper and lower extremities. Cranial nerves II through XII are grossly intact. Proprioception is intact. No peripheral adenopathy or edema is identified. No motor or sensory levels are noted. Crude visual fields are within normal range.  LABORATORY DATA: Pathology reports from cytology of bronchoscopy reviewed    RADIOLOGY RESULTS: CT and PET/CT scans reviewed   IMPRESSION: Extensive stage small cell lung cancer with liver metastasis in 69 year old female with previous history of stage IV breast cancer for palliative radiation therapy to her chest  PLAN: At this time I to go ahead with palliative radiation therapy to her chest to prevent further lung collapse atelectasis hemoptysis and further worsening of her pulmonary symptoms. Would plan on a short course of radiation therapy 4000 cGy over 4 weeks using three-dimensional treatment planning. Risks and benefits of treatment including possible increased cough fatigue possible dysphasia from radiation esophagitis, alteration of blood counts skin reaction all were discussed in detail with the patient and her family. They all seem to comprehend my treatment plan well. I have set up and ordered CT simulation for early next week. I've discussed the case personally with medical oncology and the case was discussed at our weekly tumor conference. Patient will be seeing medical oncology for consideration of systemic chemotherapy. May elect to do prophylactic cranial irradiation in the future should reshoot responded well to systemic therapy.  I would like to take this opportunity for allowing me to participate in the care of  your patient.Armstead Peaks., MD

## 2015-04-16 ENCOUNTER — Ambulatory Visit
Admission: RE | Admit: 2015-04-16 | Discharge: 2015-04-16 | Disposition: A | Discharge status: Home / Self Care | Payer: Medicare Other | Source: Ambulatory Visit | Attending: Radiation Oncology | Admitting: Radiation Oncology

## 2015-04-16 DIAGNOSIS — C771 Secondary and unspecified malignant neoplasm of intrathoracic lymph nodes: Secondary | ICD-10-CM | POA: Diagnosis not present

## 2015-04-16 DIAGNOSIS — I509 Heart failure, unspecified: Secondary | ICD-10-CM | POA: Diagnosis not present

## 2015-04-16 DIAGNOSIS — E119 Type 2 diabetes mellitus without complications: Secondary | ICD-10-CM | POA: Diagnosis not present

## 2015-04-16 DIAGNOSIS — C3492 Malignant neoplasm of unspecified part of left bronchus or lung: Secondary | ICD-10-CM | POA: Diagnosis not present

## 2015-04-16 DIAGNOSIS — Z853 Personal history of malignant neoplasm of breast: Secondary | ICD-10-CM | POA: Diagnosis not present

## 2015-04-16 DIAGNOSIS — Z87891 Personal history of nicotine dependence: Secondary | ICD-10-CM | POA: Diagnosis not present

## 2015-04-16 DIAGNOSIS — N189 Chronic kidney disease, unspecified: Secondary | ICD-10-CM | POA: Diagnosis not present

## 2015-04-16 DIAGNOSIS — J449 Chronic obstructive pulmonary disease, unspecified: Secondary | ICD-10-CM | POA: Diagnosis not present

## 2015-04-16 DIAGNOSIS — Z510 Encounter for antineoplastic radiation therapy: Secondary | ICD-10-CM | POA: Diagnosis present

## 2015-04-16 DIAGNOSIS — I129 Hypertensive chronic kidney disease with stage 1 through stage 4 chronic kidney disease, or unspecified chronic kidney disease: Secondary | ICD-10-CM | POA: Diagnosis not present

## 2015-04-16 DIAGNOSIS — C787 Secondary malignant neoplasm of liver and intrahepatic bile duct: Secondary | ICD-10-CM | POA: Diagnosis not present

## 2015-04-17 DIAGNOSIS — Z510 Encounter for antineoplastic radiation therapy: Secondary | ICD-10-CM | POA: Diagnosis not present

## 2015-04-18 DIAGNOSIS — Z510 Encounter for antineoplastic radiation therapy: Secondary | ICD-10-CM | POA: Diagnosis not present

## 2015-04-20 ENCOUNTER — Other Ambulatory Visit: Payer: Self-pay | Admitting: Oncology

## 2015-04-20 DIAGNOSIS — C3490 Malignant neoplasm of unspecified part of unspecified bronchus or lung: Secondary | ICD-10-CM | POA: Insufficient documentation

## 2015-04-20 MED ORDER — ONDANSETRON HCL 8 MG PO TABS: 8.0000 mg | ORAL_TABLET | Freq: Two times a day (BID) | ORAL | Status: DC | PRN

## 2015-04-20 MED ORDER — PROCHLORPERAZINE MALEATE 10 MG PO TABS: 10.0000 mg | ORAL_TABLET | Freq: Four times a day (QID) | ORAL | Status: DC | PRN

## 2015-04-20 MED ORDER — LIDOCAINE-PRILOCAINE 2.5-2.5 % EX CREA: TOPICAL_CREAM | CUTANEOUS | Status: DC

## 2015-04-23 ENCOUNTER — Inpatient Hospital Stay: Payer: Medicare Other

## 2015-04-23 ENCOUNTER — Telehealth: Payer: Self-pay | Admitting: *Deleted

## 2015-04-23 ENCOUNTER — Inpatient Hospital Stay (HOSPITAL_BASED_OUTPATIENT_CLINIC_OR_DEPARTMENT_OTHER): Payer: Medicare Other | Admitting: Oncology

## 2015-04-23 ENCOUNTER — Ambulatory Visit
Admission: RE | Admit: 2015-04-23 | Discharge: 2015-04-23 | Disposition: A | Discharge status: Home / Self Care | Payer: Medicare Other | Source: Ambulatory Visit | Attending: Radiation Oncology | Admitting: Radiation Oncology

## 2015-04-23 VITALS — BP 90/60 | HR 88 | Temp 96.9°F | Resp 18 | Wt 256.0 lb

## 2015-04-23 DIAGNOSIS — C3402 Malignant neoplasm of left main bronchus: Secondary | ICD-10-CM | POA: Diagnosis not present

## 2015-04-23 DIAGNOSIS — C3492 Malignant neoplasm of unspecified part of left bronchus or lung: Secondary | ICD-10-CM

## 2015-04-23 DIAGNOSIS — C787 Secondary malignant neoplasm of liver and intrahepatic bile duct: Secondary | ICD-10-CM | POA: Diagnosis not present

## 2015-04-23 DIAGNOSIS — Z85830 Personal history of malignant neoplasm of bone: Secondary | ICD-10-CM

## 2015-04-23 DIAGNOSIS — C3490 Malignant neoplasm of unspecified part of unspecified bronchus or lung: Secondary | ICD-10-CM

## 2015-04-23 DIAGNOSIS — Z853 Personal history of malignant neoplasm of breast: Secondary | ICD-10-CM | POA: Diagnosis not present

## 2015-04-23 DIAGNOSIS — Z8505 Personal history of malignant neoplasm of liver: Secondary | ICD-10-CM

## 2015-04-23 DIAGNOSIS — J449 Chronic obstructive pulmonary disease, unspecified: Secondary | ICD-10-CM

## 2015-04-23 DIAGNOSIS — Z87891 Personal history of nicotine dependence: Secondary | ICD-10-CM

## 2015-04-23 DIAGNOSIS — R059 Cough, unspecified: Secondary | ICD-10-CM

## 2015-04-23 DIAGNOSIS — Z8541 Personal history of malignant neoplasm of cervix uteri: Secondary | ICD-10-CM

## 2015-04-23 DIAGNOSIS — Z9221 Personal history of antineoplastic chemotherapy: Secondary | ICD-10-CM

## 2015-04-23 DIAGNOSIS — I129 Hypertensive chronic kidney disease with stage 1 through stage 4 chronic kidney disease, or unspecified chronic kidney disease: Secondary | ICD-10-CM

## 2015-04-23 DIAGNOSIS — Z5111 Encounter for antineoplastic chemotherapy: Secondary | ICD-10-CM | POA: Diagnosis not present

## 2015-04-23 DIAGNOSIS — Z9011 Acquired absence of right breast and nipple: Secondary | ICD-10-CM

## 2015-04-23 DIAGNOSIS — R05 Cough: Secondary | ICD-10-CM

## 2015-04-23 DIAGNOSIS — Z510 Encounter for antineoplastic radiation therapy: Secondary | ICD-10-CM | POA: Diagnosis not present

## 2015-04-23 DIAGNOSIS — N189 Chronic kidney disease, unspecified: Secondary | ICD-10-CM

## 2015-04-23 LAB — COMPREHENSIVE METABOLIC PANEL
ALBUMIN: 3.3 g/dL — AB (ref 3.5–5.0)
ALK PHOS: 305 U/L — AB (ref 38–126)
ALT: 42 U/L (ref 14–54)
ANION GAP: 7 (ref 5–15)
AST: 42 U/L — ABNORMAL HIGH (ref 15–41)
BILIRUBIN TOTAL: 0.5 mg/dL (ref 0.3–1.2)
BUN: 24 mg/dL — AB (ref 6–20)
CALCIUM: 9.1 mg/dL (ref 8.9–10.3)
CO2: 29 mmol/L (ref 22–32)
Chloride: 99 mmol/L — ABNORMAL LOW (ref 101–111)
Creatinine, Ser: 1.11 mg/dL — ABNORMAL HIGH (ref 0.44–1.00)
GFR calc Af Amer: 57 mL/min — ABNORMAL LOW (ref >60)
GFR, EST NON AFRICAN AMERICAN: 49 mL/min — AB (ref >60)
GLUCOSE: 184 mg/dL — AB (ref 65–99)
Potassium: 3.6 mmol/L (ref 3.5–5.1)
Sodium: 135 mmol/L (ref 135–145)
TOTAL PROTEIN: 7 g/dL (ref 6.5–8.1)

## 2015-04-23 LAB — CBC WITH DIFFERENTIAL/PLATELET
BASOS ABS: 0 10*3/uL (ref 0–0.1)
Basophils Relative: 0 %
EOS ABS: 0.3 10*3/uL (ref 0–0.7)
Eosinophils Relative: 3 %
HEMATOCRIT: 35.1 % (ref 35.0–47.0)
Hemoglobin: 11.5 g/dL — ABNORMAL LOW (ref 12.0–16.0)
Lymphocytes Relative: 16 %
Lymphs Abs: 1.5 10*3/uL (ref 1.0–3.6)
MCH: 28.5 pg (ref 26.0–34.0)
MCHC: 32.7 g/dL (ref 32.0–36.0)
MCV: 87.2 fL (ref 80.0–100.0)
MONO ABS: 0.6 10*3/uL (ref 0.2–0.9)
MONOS PCT: 7 %
NEUTROS ABS: 7.1 10*3/uL — AB (ref 1.4–6.5)
Neutrophils Relative %: 74 %
PLATELETS: 268 10*3/uL (ref 150–440)
RBC: 4.03 MIL/uL (ref 3.80–5.20)
RDW: 14.5 % (ref 11.5–14.5)
WBC: 9.7 10*3/uL (ref 3.6–11.0)

## 2015-04-23 MED ORDER — SODIUM CHLORIDE 0.9 % IV SOLN
80.0000 mg/m2 | Freq: Once | INTRAVENOUS | Status: AC
  Administered 2015-04-23: 186 mg via INTRAVENOUS
  Filled 2015-04-23: qty 186

## 2015-04-23 MED ORDER — PALONOSETRON HCL INJECTION 0.25 MG/5ML
0.2500 mg | Freq: Once | INTRAVENOUS | Status: AC
  Administered 2015-04-23: 0.25 mg via INTRAVENOUS
  Filled 2015-04-23: qty 5

## 2015-04-23 MED ORDER — BENZONATATE 100 MG PO CAPS
100.0000 mg | ORAL_CAPSULE | Freq: Once | ORAL | Status: AC
  Administered 2015-04-23: 100 mg via ORAL
  Filled 2015-04-23: qty 1

## 2015-04-23 MED ORDER — SODIUM CHLORIDE 0.9 % IV SOLN
80.0000 mg/m2 | Freq: Once | INTRAVENOUS | Status: AC
  Administered 2015-04-23: 190 mg via INTRAVENOUS
  Filled 2015-04-23: qty 9.5

## 2015-04-23 MED ORDER — POTASSIUM CHLORIDE 2 MEQ/ML IV SOLN
Freq: Once | INTRAVENOUS | Status: AC
  Administered 2015-04-23: 10:00:00 via INTRAVENOUS
  Filled 2015-04-23: qty 1000

## 2015-04-23 MED ORDER — SODIUM CHLORIDE 0.9 % IV SOLN
Freq: Once | INTRAVENOUS | Status: AC
  Administered 2015-04-23: 10:00:00 via INTRAVENOUS
  Filled 2015-04-23: qty 1000

## 2015-04-23 MED ORDER — BENZONATATE 200 MG PO CAPS: 200.0000 mg | ORAL_CAPSULE | Freq: Three times a day (TID) | ORAL | Status: DC | PRN

## 2015-04-23 MED ORDER — FOSAPREPITANT DIMEGLUMINE INJECTION 150 MG
Freq: Once | INTRAVENOUS | Status: AC
  Administered 2015-04-23: 12:00:00 via INTRAVENOUS
  Filled 2015-04-23: qty 5

## 2015-04-23 MED ORDER — HEPARIN SOD (PORK) LOCK FLUSH 100 UNIT/ML IV SOLN
500.0000 [IU] | Freq: Once | INTRAVENOUS | Status: DC | PRN
  Filled 2015-04-23: qty 5

## 2015-04-23 MED ORDER — SODIUM CHLORIDE 0.9 % IJ SOLN
10.0000 mL | INTRAMUSCULAR | Status: DC | PRN
  Administered 2015-04-23: 10 mL
  Filled 2015-04-23: qty 10

## 2015-04-23 MED ORDER — PROCHLORPERAZINE MALEATE 10 MG PO TABS: 10.0000 mg | ORAL_TABLET | Freq: Four times a day (QID) | ORAL | Status: DC | PRN

## 2015-04-23 NOTE — Telephone Encounter (Signed)
QT pro;longation with Ondansetron and Citalopram. Per Dr Grayland Ormond dc ondansetron adn use Prochlorperazine. Pharmacy notified

## 2015-04-24 ENCOUNTER — Inpatient Hospital Stay: Payer: Medicare Other

## 2015-04-24 ENCOUNTER — Ambulatory Visit
Admission: RE | Admit: 2015-04-24 | Discharge: 2015-04-24 | Disposition: A | Discharge status: Home / Self Care | Payer: Medicare Other | Source: Ambulatory Visit | Attending: Radiation Oncology | Admitting: Radiation Oncology

## 2015-04-24 ENCOUNTER — Ambulatory Visit: Payer: Medicare Other

## 2015-04-24 VITALS — BP 107/63 | HR 78 | Temp 96.3°F | Resp 18

## 2015-04-24 DIAGNOSIS — Z510 Encounter for antineoplastic radiation therapy: Secondary | ICD-10-CM | POA: Diagnosis not present

## 2015-04-24 DIAGNOSIS — Z5111 Encounter for antineoplastic chemotherapy: Secondary | ICD-10-CM | POA: Diagnosis not present

## 2015-04-24 DIAGNOSIS — C3490 Malignant neoplasm of unspecified part of unspecified bronchus or lung: Secondary | ICD-10-CM

## 2015-04-24 MED ORDER — ETOPOSIDE CHEMO INJECTION 1 GM/50ML
80.0000 mg/m2 | Freq: Once | INTRAVENOUS | Status: AC
  Administered 2015-04-24: 190 mg via INTRAVENOUS
  Filled 2015-04-24: qty 9.5

## 2015-04-24 MED ORDER — SODIUM CHLORIDE 0.9 % IJ SOLN
10.0000 mL | INTRAMUSCULAR | Status: DC | PRN
  Administered 2015-04-24: 10 mL
  Filled 2015-04-24: qty 10

## 2015-04-24 MED ORDER — HEPARIN SOD (PORK) LOCK FLUSH 100 UNIT/ML IV SOLN
500.0000 [IU] | Freq: Once | INTRAVENOUS | Status: AC | PRN
  Administered 2015-04-24: 500 [IU]
  Filled 2015-04-24: qty 5

## 2015-04-24 MED ORDER — SODIUM CHLORIDE 0.9 % IV SOLN
Freq: Once | INTRAVENOUS | Status: AC
  Administered 2015-04-24: 15:00:00 via INTRAVENOUS
  Filled 2015-04-24: qty 1000

## 2015-04-24 MED ORDER — SODIUM CHLORIDE 0.9 % IV SOLN
10.0000 mg | Freq: Once | INTRAVENOUS | Status: AC
  Administered 2015-04-24: 10 mg via INTRAVENOUS
  Filled 2015-04-24: qty 1

## 2015-04-25 ENCOUNTER — Ambulatory Visit: Payer: Medicare Other

## 2015-04-25 ENCOUNTER — Ambulatory Visit
Admission: RE | Admit: 2015-04-25 | Discharge: 2015-04-25 | Disposition: A | Discharge status: Home / Self Care | Payer: Medicare Other | Source: Ambulatory Visit | Attending: Radiation Oncology | Admitting: Radiation Oncology

## 2015-04-25 ENCOUNTER — Inpatient Hospital Stay: Payer: Medicare Other

## 2015-04-25 VITALS — BP 134/83 | HR 82 | Resp 20

## 2015-04-25 DIAGNOSIS — Z510 Encounter for antineoplastic radiation therapy: Secondary | ICD-10-CM | POA: Diagnosis not present

## 2015-04-25 DIAGNOSIS — Z5111 Encounter for antineoplastic chemotherapy: Secondary | ICD-10-CM | POA: Diagnosis not present

## 2015-04-25 DIAGNOSIS — C3490 Malignant neoplasm of unspecified part of unspecified bronchus or lung: Secondary | ICD-10-CM

## 2015-04-25 MED ORDER — DEXAMETHASONE SODIUM PHOSPHATE 100 MG/10ML IJ SOLN
10.0000 mg | Freq: Once | INTRAMUSCULAR | Status: AC
  Administered 2015-04-25: 10 mg via INTRAVENOUS
  Filled 2015-04-25: qty 1

## 2015-04-25 MED ORDER — HEPARIN SOD (PORK) LOCK FLUSH 100 UNIT/ML IV SOLN
INTRAVENOUS | Status: AC
  Filled 2015-04-25: qty 5

## 2015-04-25 MED ORDER — HEPARIN SOD (PORK) LOCK FLUSH 100 UNIT/ML IV SOLN
500.0000 [IU] | Freq: Once | INTRAVENOUS | Status: AC
  Administered 2015-04-25: 500 [IU] via INTRAVENOUS

## 2015-04-25 MED ORDER — SODIUM CHLORIDE 0.9 % IV SOLN
Freq: Once | INTRAVENOUS | Status: AC
Start: 2015-04-25 — End: 2015-04-25
  Administered 2015-04-25: 14:00:00 via INTRAVENOUS
  Filled 2015-04-25: qty 1000

## 2015-04-25 MED ORDER — SODIUM CHLORIDE 0.9 % IV SOLN
80.0000 mg/m2 | Freq: Once | INTRAVENOUS | Status: AC
  Administered 2015-04-25: 190 mg via INTRAVENOUS
  Filled 2015-04-25: qty 9.5

## 2015-04-26 ENCOUNTER — Ambulatory Visit: Payer: Medicare Other

## 2015-04-26 ENCOUNTER — Ambulatory Visit
Admission: RE | Admit: 2015-04-26 | Discharge: 2015-04-26 | Disposition: A | Discharge status: Home / Self Care | Payer: Medicare Other | Source: Ambulatory Visit | Attending: Radiation Oncology | Admitting: Radiation Oncology

## 2015-04-26 DIAGNOSIS — Z510 Encounter for antineoplastic radiation therapy: Secondary | ICD-10-CM | POA: Diagnosis not present

## 2015-04-27 ENCOUNTER — Ambulatory Visit
Admission: RE | Admit: 2015-04-27 | Discharge: 2015-04-27 | Disposition: A | Discharge status: Home / Self Care | Payer: Medicare Other | Source: Ambulatory Visit | Attending: Radiation Oncology | Admitting: Radiation Oncology

## 2015-04-27 ENCOUNTER — Ambulatory Visit: Payer: Medicare Other

## 2015-04-27 DIAGNOSIS — Z510 Encounter for antineoplastic radiation therapy: Secondary | ICD-10-CM | POA: Diagnosis not present

## 2015-04-28 NOTE — Progress Notes (Signed)
[place]  Telephone:(336) [phone] Fax:(336) [phone]  ID: Sherry Prince OB: 09-Aug-1945  MR#: [id number]  WOE#:[id number]  Patient Care Team: Theotis Burrow, MD as PCP - General (Family Medicine)  CHIEF COMPLAINT: History of stage IV breast cancer, now with small cell lung cancer.   INTERVAL HISTORY: Patient returns to clinic today for discussion of her pathology results and treatment planning. She currently feels well and is back to her baseline. She has no neurologic complaints. She denies any recent fevers. She denies any chest pain or shortness of breath. She has a good appetite and denies weight loss. She denies any pain. She has no nausea, vomiting, constipation, or diarrhea. She has no urinary complaints. Patient offers no specific complaints today.  REVIEW OF SYSTEMS:   Review of Systems  Constitutional: Negative for malaise/fatigue.  Respiratory: Negative.  Negative for cough and shortness of breath.   Cardiovascular: Negative.   Gastrointestinal: Negative.   Genitourinary: Negative.   Musculoskeletal: Negative.   Neurological: Negative.  Negative for weakness.    As per HPI. Otherwise, a complete review of systems is negatve.  PAST MEDICAL HISTORY: Past Medical History  Diagnosis Date  . Diabetes mellitus without complication ([place])   . Chronic kidney disease   . GERD (gastroesophageal reflux disease)   . Hypertension   . Ear pain   . Serum calcium elevated   . Floaters   . Sleep apnea   . Cancer ([place])   . Liver cancer ([place])   . Cervical cancer ([place])   . Bone cancer ([place])   . CHF (congestive heart failure) ([place])   . COPD (chronic obstructive pulmonary disease) ([place])     PAST SURGICAL HISTORY: Past Surgical History  Procedure Laterality Date  . Abdominal hysterectomy    . Breast surgery    . Eye surgery    . Endobronchial ultrasound N/A 03/29/2015    Procedure: ENDOBRONCHIAL ULTRASOUND;  Surgeon: Flora Lipps, MD;  Location: [facility]  ORS;  Service: Cardiopulmonary;  Laterality: N/A;    FAMILY HISTORY Family History  Problem Relation Age of Onset  . Cancer Father        ADVANCED DIRECTIVES:    HEALTH MAINTENANCE: Social History  Substance Use Topics  . Smoking status: Former Research scientist (life sciences)  . Smokeless tobacco: None  . Alcohol Use: No     Colonoscopy:  PAP:  Bone density:  Lipid panel:  Allergies  Allergen Reactions  . Peanut Oil Anaphylaxis  . Penicillins Hives    Current Outpatient Prescriptions  Medication Sig Dispense Refill  . albuterol (PROVENTIL) (2.5 MG/3ML) 0.083% nebulizer solution Take 2.5 mg by nebulization every 2 (two) hours as needed for wheezing or shortness of breath.    . ARTIFICIAL TEARS 0.1-0.3 % SOLN Place 1 drop into both eyes as needed (dry eyes).     Marland Kitchen atorvastatin (LIPITOR) 40 MG tablet Take 40 mg by mouth daily.    . budesonide-formoterol (SYMBICORT) 160-4.5 MCG/ACT inhaler Inhale 2 puffs into the lungs 2 (two) times daily.    Marland Kitchen buPROPion (WELLBUTRIN SR) 100 MG 12 hr tablet Take 100 mg by mouth daily.    . citalopram (CELEXA) 40 MG tablet Take 40 mg by mouth every morning.    . docusate sodium (COLACE) 100 MG capsule Take 1 capsule (100 mg total) by mouth 2 (two) times daily. 10 capsule 0  . fluticasone (FLONASE) 50 MCG/ACT nasal spray Place 1 spray into the nose daily as needed for allergies or rhinitis.     Marland Kitchen  furosemide (LASIX) 40 MG tablet Take 1 tablet (40 mg total) by mouth daily. 30 tablet 0  . gabapentin (NEURONTIN) 300 MG capsule Take 300 mg by mouth at bedtime.    Marland Kitchen HYDROcodone-acetaminophen (NORCO/VICODIN) 5-325 MG tablet Take 1-2 tablets by mouth every 6 (six) hours as needed for moderate pain or severe pain. 20 tablet 0  . insulin detemir (LEVEMIR) 100 UNIT/ML injection Inject 35 Units into the skin at bedtime.    . isosorbide mononitrate (IMDUR) 30 MG 24 hr tablet Take 30 mg by mouth daily.    Marland Kitchen lisinopril (PRINIVIL,ZESTRIL) 10 MG tablet Take 10 mg by mouth daily.    Marland Kitchen  LORazepam (ATIVAN) 1 MG tablet Take 1 mg by mouth every 8 (eight) hours as needed for anxiety.    . metFORMIN (GLUCOPHAGE) 1000 MG tablet     . metoprolol tartrate (LOPRESSOR) 25 MG tablet Take 25 mg by mouth 2 (two) times daily.    . promethazine (PHENERGAN) 25 MG tablet Take 25 mg by mouth every 6 (six) hours as needed for nausea or vomiting.    . tiotropium (SPIRIVA HANDIHALER) 18 MCG inhalation capsule Place 1 capsule into inhaler and inhale daily.    . traMADol (ULTRAM) 50 MG tablet Take 50 mg by mouth.    . traZODone (DESYREL) 50 MG tablet Take 1 tablet (50 mg total) by mouth at bedtime. 30 tablet 2  . benzonatate (TESSALON) 200 MG capsule Take 1 capsule (200 mg total) by mouth 3 (three) times daily as needed for cough. 20 capsule 0  . lidocaine-prilocaine (EMLA) cream Apply to affected area once 30 g 3  . prochlorperazine (COMPAZINE) 10 MG tablet Take 1 tablet (10 mg total) by mouth every 6 (six) hours as needed (Nausea or vomiting). 30 tablet 1   Current Facility-Administered Medications  Medication Dose Route Frequency Provider Last Rate Last Dose  . sodium chloride 0.9 % injection 20 mL  20 mL Other Once Mohammed Kindle, MD        OBJECTIVE: Filed Vitals:   04/12/15 1530  BP: 109/71  Pulse: 77  Temp: 96.8 F (36 C)  Resp: 20     Body mass index is 43 kg/(m^2).    ECOG FS:1 - Symptomatic but completely ambulatory  General: Well-developed, well-nourished, no acute distress. Eyes: Pink conjunctiva, anicteric sclera. Breasts: Right mastectomy. Lungs: Clear to auscultation bilaterally. Heart: Regular rate and rhythm. No rubs, murmurs, or gallops. Abdomen: Soft, nontender, nondistended. No organomegaly noted, normoactive bowel sounds. Musculoskeletal: No edema, cyanosis, or clubbing. Neuro: Alert, answering all questions appropriately. Cranial nerves grossly intact. Skin: No rashes or petechiae noted. Psych: Normal affect.   LAB RESULTS:  Lab Results  Component Value Date     NA 135 04/23/2015   K 3.6 04/23/2015   CL 99* 04/23/2015   CO2 29 04/23/2015   GLUCOSE 184* 04/23/2015   BUN 24* 04/23/2015   CREATININE 1.11* 04/23/2015   CALCIUM 9.1 04/23/2015   PROT 7.0 04/23/2015   ALBUMIN 3.3* 04/23/2015   AST 42* 04/23/2015   ALT 42 04/23/2015   ALKPHOS 305* 04/23/2015   BILITOT 0.5 04/23/2015   GFRNONAA 49* 04/23/2015   GFRAA 57* 04/23/2015    Lab Results  Component Value Date   WBC 9.7 04/23/2015   NEUTROABS 7.1* 04/23/2015   HGB 11.5* 04/23/2015   HCT 35.1 04/23/2015   MCV 87.2 04/23/2015   PLT 268 04/23/2015     STUDIES: Mr Jeri Cos Wo Contrast  April 20, 2015  CLINICAL DATA:  Initial staging of recently diagnosed left-sided small-cell lung cancer. EXAM: MRI HEAD WITHOUT AND WITH CONTRAST TECHNIQUE: Multiplanar, multiecho pulse sequences of the brain and surrounding structures were obtained without and with intravenous contrast. CONTRAST:  78m MULTIHANCE GADOBENATE DIMEGLUMINE 529 MG/ML IV SOLN COMPARISON:  None. FINDINGS: There is no evidence of acute infarct, intracranial hemorrhage, mass, midline shift, or extra-axial fluid collection. There is moderate generalized cerebral atrophy. Small foci of T2 hyperintensity in the subcortical and deep cerebral white matter and pons are nonspecific but compatible with mild chronic small vessel ischemic disease. An incidental, small left parietal developmental venous anomaly is noted. No abnormal enhancement is identified elsewhere. Prior bilateral cataract extraction is noted. There is at most trace mastoid air cell fluid bilaterally. No significant inflammatory disease is seen in the paranasal sinuses. Major intracranial vascular flow voids are preserved. No suspicious skull lesions are identified. IMPRESSION: 1. No evidence of intracranial metastases. 2. Mild chronic small vessel ischemic disease and moderate cerebral atrophy. Electronically Signed   By: ALogan BoresM.D.   On: 04/11/2015 10:42   Nm Pet Image  Initial (pi) Skull Base To Thigh  04/10/2015  CLINICAL DATA:  Initial treatment strategy for small cell lung cancer. EXAM: NUCLEAR MEDICINE PET SKULL BASE TO THIGH TECHNIQUE: 11.4 mCi F-18 FDG was injected intravenously. Full-ring PET imaging was performed from the skull base to thigh after the radiotracer. CT data was obtained and used for attenuation correction and anatomic localization. FASTING BLOOD GLUCOSE:  Value: 124 mg/dl COMPARISON:  CT 03/24/2015 FINDINGS: NECK LEFT level 3 lymph node measures 11 mm adjacent to the thyroid cartilage with SUV max equal 8.2 (image 40 fused data set). CHEST Hypermetabolic nodule measuring 2.8 cm (image 86, series 4) in the LEFT upper lobe with SUV max 15.5. There is a nodule more superiorly measuring 13 mm without associated metabolic activity which likely represents small focus of the post biopsy hemorrhage. No pleural fluid in the LEFT hemi thorax. There are hypermetabolic mediastinal lymph nodes including a subcarinal lymph node measuring 23 mm short axis with SUV max 12.2. Contralateral hypermetabolic RIGHT paratracheal lymph node on image 71 of the fused data set. There is hypermetabolic mass in the LEFT hilum measuring 3.3 cm. ABDOMEN/PELVIS There are multiple foci of hypermetabolic activity within the LEFT and RIGHT hepatic lobe. These correspond to low-density lesions on comparison CT consistent widespread hepatic metastasis. Individual lesions have intense metabolic activity with SUV max equal 12.0. There are approximately 15 lesions within each hepatic lobe. No abnormal metabolic activity adrenal glands. Spleen is normal. No hypermetabolic abdominal or pelvic lymph nodes. SKELETON No focal hypermetabolic activity to suggest skeletal metastasis. IMPRESSION: 1. Hypermetabolic mass in LEFT upper lobe consists with primary bronchogenic carcinoma. 2. Extensive metastatic hypermetabolic adenopathy including LEFT hilum, subcarinal, contralateral paratracheal, and LEFT  level III neck lymph node. 3. Multiple foci of hypermetabolic HEPATIC METASTASIS. 4. Small focus of post biopsy hemorrhage in the LEFT upper lobe. Electronically Signed   By: SSuzy BouchardM.D.   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  PLAN:    1. History of breast cancer: Patient was diagnosed with a stage I breast cancer in 2005 which was ER/PR negative, HER-2 positive. She had a right mastectomy and chemotherapy at that time with dose dense Adriamycin and Cytoxan 4 without Herceptin. She did not have radiation. She subsequently had a recurrence in 2010  that was diagnosed from a sternal FNA. She was found to have involvement of her right chest wall and pectoralis muscle as well as mediastinal lymphadenopathy, lung nodules, bone, and liver lesions. She was again given chemotherapy with Taxol, Herceptin, and Zometa between February 2010 and September 2010 with a partial response. Taxol was discontinued then she received Herceptin and Zometa from September 2010 through November and 2012. Herceptin was subsequently discontinued secondary to worsening EF. She has been maintained on Xgeva approximately every 2 months since that time. We will need to obtain more records from [facility] for further information regarding her breast cancer treatments. Patient's most recent CA-27-29 on on March 24, 2015 was 22.3. 2. Small cell lung cancer: CT scan and pathology results reviewed independently confirming second primary. PET scan and MRI also reviewed independently and reported as above. MRI the brain is negative, but the patient has metastatic disease in her liver. After lengthy discussion with patient and her family she wishes to pursue palliative chemotherapy with cisplatin and etoposide. Patient will also be evaluated by radiation oncology today. Return to clinic in approximately one week to initiate cycle  1.    Approximately 30 minutes was spent in discussion and consultation.  Patient expressed understanding and was in agreement with this plan. She also understands that She can call clinic at any time with any questions, concerns, or complaints.    Youtz Huger, MD   04/28/2015 6:53 PM

## 2015-04-28 NOTE — Progress Notes (Signed)
Sherry Prince  [phone]) [phone] Fax:(336) [phone]  ID: LEEBA BARBE OB: 05-13-1946  MR#: [id number]  TDV#:[id number]  Patient Care Team: Theotis Burrow, MD as PCP - General (Family Medicine)  CHIEF COMPLAINT: History of stage IV breast cancer, now with small cell lung cancer.   INTERVAL HISTORY: Patient returns to clinic today to initiate cycle 1 of palliative cisplatin and etoposide. She currently feels well and is asymptomatic. She has no neurologic complaints. She denies any recent fevers. She denies any chest pain or shortness of breath. She has a good appetite and denies weight loss. She denies any pain. She has no nausea, vomiting, constipation, or diarrhea. She has no urinary complaints. Patient offers no specific complaints today.  REVIEW OF SYSTEMS:   Review of Systems  Constitutional: Negative for malaise/fatigue.  Respiratory: Negative.  Negative for cough and shortness of breath.   Cardiovascular: Negative.   Gastrointestinal: Negative.   Genitourinary: Negative.   Musculoskeletal: Negative.   Neurological: Negative.  Negative for weakness.    As per HPI. Otherwise, a complete review of systems is negatve.  PAST MEDICAL HISTORY: Past Medical History  Diagnosis Date  . Diabetes mellitus without complication ([place])   . Chronic kidney disease   . GERD (gastroesophageal reflux disease)   . Hypertension   . Ear pain   . Serum calcium elevated   . Floaters   . Sleep apnea   . Cancer ([place])   . Liver cancer ([place])   . Cervical cancer ([place])   . Bone cancer ([place])   . CHF (congestive heart failure) ([place])   . COPD (chronic obstructive pulmonary disease) ([place])     PAST SURGICAL HISTORY: Past Surgical History  Procedure Laterality Date  . Abdominal hysterectomy    . Breast surgery    . Eye surgery    . Endobronchial ultrasound N/A 03/29/2015    Procedure: ENDOBRONCHIAL ULTRASOUND;  Surgeon: Flora Lipps, MD;  Location: [facility] ORS;   Service: Cardiopulmonary;  Laterality: N/A;    FAMILY HISTORY Family History  Problem Relation Age of Onset  . Cancer Father        ADVANCED DIRECTIVES:    HEALTH MAINTENANCE: Social History  Substance Use Topics  . Smoking status: Former Research scientist (life sciences)  . Smokeless tobacco: Not on file  . Alcohol Use: No     Colonoscopy:  PAP:  Bone density:  Lipid panel:  Allergies  Allergen Reactions  . Peanut Oil Anaphylaxis  . Penicillins Hives    Current Outpatient Prescriptions  Medication Sig Dispense Refill  . albuterol (PROVENTIL) (2.5 MG/3ML) 0.083% nebulizer solution Take 2.5 mg by nebulization every 2 (two) hours as needed for wheezing or shortness of breath.    . ARTIFICIAL TEARS 0.1-0.3 % SOLN Place 1 drop into both eyes as needed (dry eyes).     Marland Kitchen atorvastatin (LIPITOR) 40 MG tablet Take 40 mg by mouth daily.    . budesonide-formoterol (SYMBICORT) 160-4.5 MCG/ACT inhaler Inhale 2 puffs into the lungs 2 (two) times daily.    Marland Kitchen buPROPion (WELLBUTRIN SR) 100 MG 12 hr tablet Take 100 mg by mouth daily.    . citalopram (CELEXA) 40 MG tablet Take 40 mg by mouth every morning.    . docusate sodium (COLACE) 100 MG capsule Take 1 capsule (100 mg total) by mouth 2 (two) times daily. 10 capsule 0  . fluticasone (FLONASE) 50 MCG/ACT nasal spray Place 1 spray into the nose daily as needed for allergies or rhinitis.     Marland Kitchen  furosemide (LASIX) 40 MG tablet Take 1 tablet (40 mg total) by mouth daily. 30 tablet 0  . gabapentin (NEURONTIN) 300 MG capsule Take 300 mg by mouth at bedtime.    Marland Kitchen HYDROcodone-acetaminophen (NORCO/VICODIN) 5-325 MG tablet Take 1-2 tablets by mouth every 6 (six) hours as needed for moderate pain or severe pain. 20 tablet 0  . insulin detemir (LEVEMIR) 100 UNIT/ML injection Inject 35 Units into the skin at bedtime.    . isosorbide mononitrate (IMDUR) 30 MG 24 hr tablet Take 30 mg by mouth daily.    Marland Kitchen lidocaine-prilocaine (EMLA) cream Apply to affected area once 30 g 3  .  lisinopril (PRINIVIL,ZESTRIL) 10 MG tablet Take 10 mg by mouth daily.    Marland Kitchen LORazepam (ATIVAN) 1 MG tablet Take 1 mg by mouth every 8 (eight) hours as needed for anxiety.    . metFORMIN (GLUCOPHAGE) 1000 MG tablet     . metoprolol tartrate (LOPRESSOR) 25 MG tablet Take 25 mg by mouth 2 (two) times daily.    . promethazine (PHENERGAN) 25 MG tablet Take 25 mg by mouth every 6 (six) hours as needed for nausea or vomiting.    . tiotropium (SPIRIVA HANDIHALER) 18 MCG inhalation capsule Place 1 capsule into inhaler and inhale daily.    . traMADol (ULTRAM) 50 MG tablet Take 50 mg by mouth.    . traZODone (DESYREL) 50 MG tablet Take 1 tablet (50 mg total) by mouth at bedtime. 30 tablet 2  . benzonatate (TESSALON) 200 MG capsule Take 1 capsule (200 mg total) by mouth 3 (three) times daily as needed for cough. 20 capsule 0  . prochlorperazine (COMPAZINE) 10 MG tablet Take 1 tablet (10 mg total) by mouth every 6 (six) hours as needed (Nausea or vomiting). 30 tablet 1   Current Facility-Administered Medications  Medication Dose Route Frequency Provider Last Rate Last Dose  . sodium chloride 0.9 % injection 20 mL  20 mL Other Once Mohammed Kindle, MD        OBJECTIVE: Filed Vitals:   04/23/15 0931  BP: 90/60  Pulse: 88  Temp: 96.9 F (36.1 C)  Resp: 18     Body mass index is 42.59 kg/(m^2).    ECOG FS:1 - Symptomatic but completely ambulatory  General: Well-developed, well-nourished, no acute distress. Eyes: Pink conjunctiva, anicteric sclera. Breasts: Right mastectomy. Lungs: Clear to auscultation bilaterally. Heart: Regular rate and rhythm. No rubs, murmurs, or gallops. Abdomen: Soft, nontender, nondistended. No organomegaly noted, normoactive bowel sounds. Musculoskeletal: No edema, cyanosis, or clubbing. Neuro: Alert, answering all questions appropriately. Cranial nerves grossly intact. Skin: No rashes or petechiae noted. Psych: Normal affect.   LAB RESULTS:  Lab Results  Component Value  Date   NA 135 04/23/2015   K 3.6 04/23/2015   CL 99* 04/23/2015   CO2 29 04/23/2015   GLUCOSE 184* 04/23/2015   BUN 24* 04/23/2015   CREATININE 1.11* 04/23/2015   CALCIUM 9.1 04/23/2015   PROT 7.0 04/23/2015   ALBUMIN 3.3* 04/23/2015   AST 42* 04/23/2015   ALT 42 04/23/2015   ALKPHOS 305* 04/23/2015   BILITOT 0.5 04/23/2015   GFRNONAA 49* 04/23/2015   GFRAA 57* 04/23/2015    Lab Results  Component Value Date   WBC 9.7 04/23/2015   NEUTROABS 7.1* 04/23/2015   HGB 11.5* 04/23/2015   HCT 35.1 04/23/2015   MCV 87.2 04/23/2015   PLT 268 04/23/2015     STUDIES: Mr Jeri Cos Wo Contrast  April 15, 2015  CLINICAL DATA:  Initial  staging of recently diagnosed left-sided small-cell lung cancer. EXAM: MRI HEAD WITHOUT AND WITH CONTRAST TECHNIQUE: Multiplanar, multiecho pulse sequences of the brain and surrounding structures were obtained without and with intravenous contrast. CONTRAST:  48m MULTIHANCE GADOBENATE DIMEGLUMINE 529 MG/ML IV SOLN COMPARISON:  None. FINDINGS: There is no evidence of acute infarct, intracranial hemorrhage, mass, midline shift, or extra-axial fluid collection. There is moderate generalized cerebral atrophy. Small foci of T2 hyperintensity in the subcortical and deep cerebral white matter and pons are nonspecific but compatible with mild chronic small vessel ischemic disease. An incidental, small left parietal developmental venous anomaly is noted. No abnormal enhancement is identified elsewhere. Prior bilateral cataract extraction is noted. There is at most trace mastoid air cell fluid bilaterally. No significant inflammatory disease is seen in the paranasal sinuses. Major intracranial vascular flow voids are preserved. No suspicious skull lesions are identified. IMPRESSION: 1. No evidence of intracranial metastases. 2. Mild chronic small vessel ischemic disease and moderate cerebral atrophy. Electronically Signed   By: ALogan BoresM.D.   On: 04/11/2015 10:42   Nm Pet  Image Initial (pi) Skull Base To Thigh  04/10/2015  CLINICAL DATA:  Initial treatment strategy for small cell lung cancer. EXAM: NUCLEAR MEDICINE PET SKULL BASE TO THIGH TECHNIQUE: 11.4 mCi F-18 FDG was injected intravenously. Full-ring PET imaging was performed from the skull base to thigh after the radiotracer. CT data was obtained and used for attenuation correction and anatomic localization. FASTING BLOOD GLUCOSE:  Value: 124 mg/dl COMPARISON:  CT 03/24/2015 FINDINGS: NECK LEFT level 3 lymph node measures 11 mm adjacent to the thyroid cartilage with SUV max equal 8.2 (image 40 fused data set). CHEST Hypermetabolic nodule measuring 2.8 cm (image 86, series 4) in the LEFT upper lobe with SUV max 15.5. There is a nodule more superiorly measuring 13 mm without associated metabolic activity which likely represents small focus of the post biopsy hemorrhage. No pleural fluid in the LEFT hemi thorax. There are hypermetabolic mediastinal lymph nodes including a subcarinal lymph node measuring 23 mm short axis with SUV max 12.2. Contralateral hypermetabolic RIGHT paratracheal lymph node on image 71 of the fused data set. There is hypermetabolic mass in the LEFT hilum measuring 3.3 cm. ABDOMEN/PELVIS There are multiple foci of hypermetabolic activity within the LEFT and RIGHT hepatic lobe. These correspond to low-density lesions on comparison CT consistent widespread hepatic metastasis. Individual lesions have intense metabolic activity with SUV max equal 12.0. There are approximately 15 lesions within each hepatic lobe. No abnormal metabolic activity adrenal glands. Spleen is normal. No hypermetabolic abdominal or pelvic lymph nodes. SKELETON No focal hypermetabolic activity to suggest skeletal metastasis. IMPRESSION: 1. Hypermetabolic mass in LEFT upper lobe consists with primary bronchogenic carcinoma. 2. Extensive metastatic hypermetabolic adenopathy including LEFT hilum, subcarinal, contralateral paratracheal, and  LEFT level III neck lymph node. 3. Multiple foci of hypermetabolic HEPATIC METASTASIS. 4. Small focus of post biopsy hemorrhage in the LEFT upper lobe. Electronically Signed   By: SSuzy BouchardM.D.   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  PLAN:    1. History of breast cancer: Patient was diagnosed with a stage I breast cancer in 2005 which was ER/PR negative, HER-2 positive. She had a right mastectomy and chemotherapy at that time with dose dense Adriamycin and Cytoxan 4 without Herceptin. She did not have radiation. She subsequently had a recurrence in 2010 that  was diagnosed from a sternal FNA. She was found to have involvement of her right chest wall and pectoralis muscle as well as mediastinal lymphadenopathy, lung nodules, bone, and liver lesions. She was again given chemotherapy with Taxol, Herceptin, and Zometa between February 2010 and September 2010 with a partial response. Taxol was discontinued then she received Herceptin and Zometa from September 2010 through November and 2012. Herceptin was subsequently discontinued secondary to worsening EF. She has been maintained on Xgeva approximately every 2 months since that time. We will need to obtain more records from [facility] for further information regarding her breast cancer treatments. Patient's most recent CA-27-29 on on March 24, 2015 was 22.3. 2. Small cell lung cancer: Stage IV disease with liver metastasis. Proceed with cycle 1 of cisplatin and etoposide today. Patient will return to clinic in 1 and 2 days for etoposide only. She will then return to clinic in 3 weeks for laboratory work, further evaluation, and consideration of cycle 2. Plan to reimage at the conclusion of cycle 4.    Patient expressed understanding and was in agreement with this plan. She also understands that She can call clinic at any time with any questions,  concerns, or complaints.    Schedler Huger, MD   04/28/2015 7:08 PM

## 2015-04-29 ENCOUNTER — Ambulatory Visit
Admission: RE | Admit: 2015-04-29 | Discharge: 2015-04-29 | Disposition: A | Discharge status: Home / Self Care | Payer: Medicare Other | Source: Ambulatory Visit | Attending: Radiation Oncology | Admitting: Radiation Oncology

## 2015-04-30 ENCOUNTER — Ambulatory Visit
Admission: RE | Admit: 2015-04-30 | Discharge: 2015-04-30 | Disposition: A | Discharge status: Home / Self Care | Payer: Medicare Other | Source: Ambulatory Visit | Attending: Radiation Oncology | Admitting: Radiation Oncology

## 2015-04-30 ENCOUNTER — Inpatient Hospital Stay: Payer: Medicare Other

## 2015-04-30 ENCOUNTER — Inpatient Hospital Stay (HOSPITAL_BASED_OUTPATIENT_CLINIC_OR_DEPARTMENT_OTHER): Payer: Medicare Other | Admitting: Oncology

## 2015-04-30 ENCOUNTER — Ambulatory Visit: Payer: Medicare Other

## 2015-04-30 VITALS — BP 109/64 | HR 120 | Temp 97.6°F | Resp 18 | Wt 247.4 lb

## 2015-04-30 DIAGNOSIS — C3402 Malignant neoplasm of left main bronchus: Secondary | ICD-10-CM | POA: Diagnosis not present

## 2015-04-30 DIAGNOSIS — R110 Nausea: Secondary | ICD-10-CM

## 2015-04-30 DIAGNOSIS — C3490 Malignant neoplasm of unspecified part of unspecified bronchus or lung: Secondary | ICD-10-CM

## 2015-04-30 DIAGNOSIS — Z853 Personal history of malignant neoplasm of breast: Secondary | ICD-10-CM | POA: Diagnosis not present

## 2015-04-30 DIAGNOSIS — R5383 Other fatigue: Secondary | ICD-10-CM

## 2015-04-30 DIAGNOSIS — K769 Liver disease, unspecified: Secondary | ICD-10-CM

## 2015-04-30 DIAGNOSIS — Z9221 Personal history of antineoplastic chemotherapy: Secondary | ICD-10-CM

## 2015-04-30 DIAGNOSIS — R05 Cough: Secondary | ICD-10-CM

## 2015-04-30 DIAGNOSIS — R531 Weakness: Secondary | ICD-10-CM

## 2015-04-30 DIAGNOSIS — C787 Secondary malignant neoplasm of liver and intrahepatic bile duct: Secondary | ICD-10-CM

## 2015-04-30 DIAGNOSIS — Z5111 Encounter for antineoplastic chemotherapy: Secondary | ICD-10-CM | POA: Diagnosis not present

## 2015-04-30 DIAGNOSIS — R5381 Other malaise: Secondary | ICD-10-CM

## 2015-04-30 DIAGNOSIS — Z510 Encounter for antineoplastic radiation therapy: Secondary | ICD-10-CM | POA: Diagnosis not present

## 2015-04-30 DIAGNOSIS — R590 Localized enlarged lymph nodes: Secondary | ICD-10-CM

## 2015-04-30 DIAGNOSIS — Z9011 Acquired absence of right breast and nipple: Secondary | ICD-10-CM

## 2015-04-30 DIAGNOSIS — R197 Diarrhea, unspecified: Secondary | ICD-10-CM

## 2015-04-30 LAB — CBC WITH DIFFERENTIAL/PLATELET
Basophils Absolute: 0 10*3/uL (ref 0–0.1)
Basophils Relative: 0 %
Eosinophils Absolute: 0 10*3/uL (ref 0–0.7)
Eosinophils Relative: 1 %
HCT: 35.7 % (ref 35.0–47.0)
HEMOGLOBIN: 11.6 g/dL — AB (ref 12.0–16.0)
LYMPHS ABS: 0.7 10*3/uL — AB (ref 1.0–3.6)
LYMPHS PCT: 15 %
MCH: 28.1 pg (ref 26.0–34.0)
MCHC: 32.3 g/dL (ref 32.0–36.0)
MCV: 87 fL (ref 80.0–100.0)
Monocytes Absolute: 0 10*3/uL — ABNORMAL LOW (ref 0.2–0.9)
Monocytes Relative: 1 %
NEUTROS ABS: 3.7 10*3/uL (ref 1.4–6.5)
NEUTROS PCT: 83 %
Platelets: 169 10*3/uL (ref 150–440)
RBC: 4.11 MIL/uL (ref 3.80–5.20)
RDW: 14.3 % (ref 11.5–14.5)
WBC: 4.5 10*3/uL (ref 3.6–11.0)

## 2015-04-30 LAB — COMPREHENSIVE METABOLIC PANEL
ALK PHOS: 212 U/L — AB (ref 38–126)
ALT: 29 U/L (ref 14–54)
AST: 27 U/L (ref 15–41)
Albumin: 3.1 g/dL — ABNORMAL LOW (ref 3.5–5.0)
Anion gap: 9 (ref 5–15)
BUN: 32 mg/dL — AB (ref 6–20)
CALCIUM: 7 mg/dL — AB (ref 8.9–10.3)
CO2: 28 mmol/L (ref 22–32)
CREATININE: 1.09 mg/dL — AB (ref 0.44–1.00)
Chloride: 99 mmol/L — ABNORMAL LOW (ref 101–111)
GFR, EST AFRICAN AMERICAN: 59 mL/min — AB (ref >60)
GFR, EST NON AFRICAN AMERICAN: 51 mL/min — AB (ref >60)
Glucose, Bld: 195 mg/dL — ABNORMAL HIGH (ref 65–99)
Potassium: 4.2 mmol/L (ref 3.5–5.1)
Sodium: 136 mmol/L (ref 135–145)
Total Bilirubin: 0.4 mg/dL (ref 0.3–1.2)
Total Protein: 6.9 g/dL (ref 6.5–8.1)

## 2015-04-30 NOTE — Progress Notes (Signed)
[place]  Telephone:(336) [phone] Fax:(336) [phone]  ID: Sherry Prince OB: March 01, 1946  MR#: [id number]  TJQ#:[id number]  Patient Care Team: Theotis Burrow, MD as PCP - General (Family Medicine)  CHIEF COMPLAINT: History of stage IV breast cancer, now with small cell lung cancer.   INTERVAL HISTORY: Patient returns to clinic today to assess her toleration of cycle 1 of palliative cisplatin and etoposide last week. She tolerated it well with only some mild nausea and diarrhea.  She continues to have increased weakness and fatigue. She has no neurologic complaints. She denies any recent fevers. She denies any chest pain or shortness of breath. She has a good appetite and denies weight loss. She denies any pain. She has no urinary complaints. Patient offers no further specific complaints today.  REVIEW OF SYSTEMS:   Review of Systems  Constitutional: Positive for malaise/fatigue. Negative for fever.  Respiratory: Negative.  Negative for cough and shortness of breath.   Cardiovascular: Negative.   Gastrointestinal: Positive for nausea and diarrhea.  Genitourinary: Negative.   Musculoskeletal: Negative.   Neurological: Positive for weakness.    As per HPI. Otherwise, a complete review of systems is negatve.  PAST MEDICAL HISTORY: Past Medical History  Diagnosis Date  . Diabetes mellitus without complication ([place])   . Chronic kidney disease   . GERD (gastroesophageal reflux disease)   . Hypertension   . Ear pain   . Serum calcium elevated   . Floaters   . Sleep apnea   . Cancer ([place])   . Liver cancer ([place])   . Cervical cancer ([place])   . Bone cancer ([place])   . CHF (congestive heart failure) ([place])   . COPD (chronic obstructive pulmonary disease) ([place])     PAST SURGICAL HISTORY: Past Surgical History  Procedure Laterality Date  . Abdominal hysterectomy    . Breast surgery    . Eye surgery    . Endobronchial ultrasound N/A 03/29/2015    Procedure:  ENDOBRONCHIAL ULTRASOUND;  Surgeon: Flora Lipps, MD;  Location: [facility] ORS;  Service: Cardiopulmonary;  Laterality: N/A;    FAMILY HISTORY Family History  Problem Relation Age of Onset  . Cancer Father        ADVANCED DIRECTIVES:    HEALTH MAINTENANCE: Social History  Substance Use Topics  . Smoking status: Former Research scientist (life sciences)  . Smokeless tobacco: Not on file  . Alcohol Use: No     Colonoscopy:  PAP:  Bone density:  Lipid panel:  Allergies  Allergen Reactions  . Peanut Oil Anaphylaxis  . Penicillins Hives    Current Outpatient Prescriptions  Medication Sig Dispense Refill  . albuterol (PROVENTIL) (2.5 MG/3ML) 0.083% nebulizer solution Take 2.5 mg by nebulization every 2 (two) hours as needed for wheezing or shortness of breath.    . ARTIFICIAL TEARS 0.1-0.3 % SOLN Place 1 drop into both eyes as needed (dry eyes).     Marland Kitchen atorvastatin (LIPITOR) 40 MG tablet Take 40 mg by mouth daily.    . benzonatate (TESSALON) 200 MG capsule Take 1 capsule (200 mg total) by mouth 3 (three) times daily as needed for cough. 20 capsule 0  . budesonide-formoterol (SYMBICORT) 160-4.5 MCG/ACT inhaler Inhale 2 puffs into the lungs 2 (two) times daily.    Marland Kitchen buPROPion (WELLBUTRIN SR) 100 MG 12 hr tablet Take 100 mg by mouth daily.    . citalopram (CELEXA) 40 MG tablet Take 40 mg by mouth every morning.    . docusate sodium (COLACE) 100 MG  capsule Take 1 capsule (100 mg total) by mouth 2 (two) times daily. 10 capsule 0  . fluticasone (FLONASE) 50 MCG/ACT nasal spray Place 1 spray into the nose daily as needed for allergies or rhinitis.     . furosemide (LASIX) 40 MG tablet Take 1 tablet (40 mg total) by mouth daily. 30 tablet 0  . gabapentin (NEURONTIN) 300 MG capsule Take 300 mg by mouth at bedtime.    Marland Kitchen HYDROcodone-acetaminophen (NORCO/VICODIN) 5-325 MG tablet Take 1-2 tablets by mouth every 6 (six) hours as needed for moderate pain or severe pain. 20 tablet 0  . insulin detemir (LEVEMIR) 100 UNIT/ML  injection Inject 35 Units into the skin at bedtime.    . isosorbide mononitrate (IMDUR) 30 MG 24 hr tablet Take 30 mg by mouth daily.    Marland Kitchen lidocaine-prilocaine (EMLA) cream Apply to affected area once 30 g 3  . lisinopril (PRINIVIL,ZESTRIL) 10 MG tablet Take 10 mg by mouth daily.    Marland Kitchen LORazepam (ATIVAN) 1 MG tablet Take 1 mg by mouth every 8 (eight) hours as needed for anxiety.    . metFORMIN (GLUCOPHAGE) 1000 MG tablet     . metoprolol tartrate (LOPRESSOR) 25 MG tablet Take 25 mg by mouth 2 (two) times daily.    . prochlorperazine (COMPAZINE) 10 MG tablet Take 1 tablet (10 mg total) by mouth every 6 (six) hours as needed (Nausea or vomiting). 30 tablet 1  . promethazine (PHENERGAN) 25 MG tablet Take 25 mg by mouth every 6 (six) hours as needed for nausea or vomiting.    . tiotropium (SPIRIVA HANDIHALER) 18 MCG inhalation capsule Place 1 capsule into inhaler and inhale daily.    . traMADol (ULTRAM) 50 MG tablet Take 50 mg by mouth.    . traZODone (DESYREL) 50 MG tablet Take 1 tablet (50 mg total) by mouth at bedtime. 30 tablet 2   Current Facility-Administered Medications  Medication Dose Route Frequency Provider Last Rate Last Dose  . sodium chloride 0.9 % injection 20 mL  20 mL Other Once Mohammed Kindle, MD        OBJECTIVE: Filed Vitals:   04/30/15 0957  BP: 109/64  Pulse: 120  Temp: 97.6 F (36.4 C)  Resp: 18     Body mass index is 41.16 kg/(m^2).    ECOG FS:1 - Symptomatic but completely ambulatory  General: Well-developed, well-nourished, no acute distress. Eyes: Pink conjunctiva, anicteric sclera. Breasts: Right mastectomy. Lungs: Clear to auscultation bilaterally. Heart: Regular rate and rhythm. No rubs, murmurs, or gallops. Abdomen: Soft, nontender, nondistended. No organomegaly noted, normoactive bowel sounds. Musculoskeletal: No edema, cyanosis, or clubbing. Neuro: Alert, answering all questions appropriately. Cranial nerves grossly intact. Skin: No rashes or petechiae  noted. Psych: Normal affect.   LAB RESULTS:  Lab Results  Component Value Date   NA 136 04/30/2015   K 4.2 04/30/2015   CL 99* 04/30/2015   CO2 28 04/30/2015   GLUCOSE 195* 04/30/2015   BUN 32* 04/30/2015   CREATININE 1.09* 04/30/2015   CALCIUM 7.0* 04/30/2015   PROT 6.9 04/30/2015   ALBUMIN 3.1* 04/30/2015   AST 27 04/30/2015   ALT 29 04/30/2015   ALKPHOS 212* 04/30/2015   BILITOT 0.4 04/30/2015   GFRNONAA 51* 04/30/2015   GFRAA 59* 04/30/2015    Lab Results  Component Value Date   WBC 4.5 04/30/2015   NEUTROABS 3.7 04/30/2015   HGB 11.6* 04/30/2015   HCT 35.7 04/30/2015   MCV 87.0 04/30/2015   PLT 169 04/30/2015  STUDIES: Mr Kizzie Fantasia Contrast  May 06, 2015  CLINICAL DATA:  Initial staging of recently diagnosed left-sided small-cell lung cancer. EXAM: MRI HEAD WITHOUT AND WITH CONTRAST TECHNIQUE: Multiplanar, multiecho pulse sequences of the brain and surrounding structures were obtained without and with intravenous contrast. CONTRAST:  70m MULTIHANCE GADOBENATE DIMEGLUMINE 529 MG/ML IV SOLN COMPARISON:  None. FINDINGS: There is no evidence of acute infarct, intracranial hemorrhage, mass, midline shift, or extra-axial fluid collection. There is moderate generalized cerebral atrophy. Small foci of T2 hyperintensity in the subcortical and deep cerebral white matter and pons are nonspecific but compatible with mild chronic small vessel ischemic disease. An incidental, small left parietal developmental venous anomaly is noted. No abnormal enhancement is identified elsewhere. Prior bilateral cataract extraction is noted. There is at most trace mastoid air cell fluid bilaterally. No significant inflammatory disease is seen in the paranasal sinuses. Major intracranial vascular flow voids are preserved. No suspicious skull lesions are identified. IMPRESSION: 1. No evidence of intracranial metastases. 2. Mild chronic small vessel ischemic disease and moderate cerebral atrophy.  Electronically Signed   By: ALogan BoresM.D.   On: 111/06/201610:42   Nm Pet Image Initial (pi) Skull Base To Thigh  04/10/2015  CLINICAL DATA:  Initial treatment strategy for small cell lung cancer. EXAM: NUCLEAR MEDICINE PET SKULL BASE TO THIGH TECHNIQUE: 11.4 mCi F-18 FDG was injected intravenously. Full-ring PET imaging was performed from the skull base to thigh after the radiotracer. CT data was obtained and used for attenuation correction and anatomic localization. FASTING BLOOD GLUCOSE:  Value: 124 mg/dl COMPARISON:  CT 03/24/2015 FINDINGS: NECK LEFT level 3 lymph node measures 11 mm adjacent to the thyroid cartilage with SUV max equal 8.2 (image 40 fused data set). CHEST Hypermetabolic nodule measuring 2.8 cm (image 86, series 4) in the LEFT upper lobe with SUV max 15.5. There is a nodule more superiorly measuring 13 mm without associated metabolic activity which likely represents small focus of the post biopsy hemorrhage. No pleural fluid in the LEFT hemi thorax. There are hypermetabolic mediastinal lymph nodes including a subcarinal lymph node measuring 23 mm short axis with SUV max 12.2. Contralateral hypermetabolic RIGHT paratracheal lymph node on image 71 of the fused data set. There is hypermetabolic mass in the LEFT hilum measuring 3.3 cm. ABDOMEN/PELVIS There are multiple foci of hypermetabolic activity within the LEFT and RIGHT hepatic lobe. These correspond to low-density lesions on comparison CT consistent widespread hepatic metastasis. Individual lesions have intense metabolic activity with SUV max equal 12.0. There are approximately 15 lesions within each hepatic lobe. No abnormal metabolic activity adrenal glands. Spleen is normal. No hypermetabolic abdominal or pelvic lymph nodes. SKELETON No focal hypermetabolic activity to suggest skeletal metastasis. IMPRESSION: 1. Hypermetabolic mass in LEFT upper lobe consists with primary bronchogenic carcinoma. 2. Extensive metastatic  hypermetabolic adenopathy including LEFT hilum, subcarinal, contralateral paratracheal, and LEFT level III neck lymph node. 3. Multiple foci of hypermetabolic HEPATIC METASTASIS. 4. Small focus of post biopsy hemorrhage in the LEFT upper lobe. Electronically Signed   By: SSuzy BouchardM.D.   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  PLAN:    1. History of breast cancer: Patient was diagnosed with a stage I breast cancer in 2005 which was ER/PR negative, HER-2 positive. She had a right mastectomy and chemotherapy at that time with dose dense Adriamycin and Cytoxan 4 without Herceptin.  She did not have radiation. She subsequently had a recurrence in 2010 that was diagnosed from a sternal FNA. She was found to have involvement of her right chest wall and pectoralis muscle as well as mediastinal lymphadenopathy, lung nodules, bone, and liver lesions. She was again given chemotherapy with Taxol, Herceptin, and Zometa between February 2010 and September 2010 with a partial response. Taxol was discontinued then she received Herceptin and Zometa from September 2010 through November and 2012. Herceptin was subsequently discontinued secondary to worsening EF. She has been maintained on Xgeva approximately every 2 months since that time. We will need to obtain more records from [facility] for further information regarding her breast cancer treatments. Patient's most recent CA-27-29 on on March 24, 2015 was 22.3. 2. Small cell lung cancer: Stage IV disease with liver metastasis. Patient tolerated cycle 1 of cisplatin and etoposide last week with only some mild nausea and diarrhea.  Return to clinic in 2 weeks for laboratory work, further evaluation, and consideration of cycle 2. Plan to reimage at the conclusion of cycle 4.  3.  Nausea: Continue compazine and phenergan as needed. 4.  Diarrhea:  Continue OTC  imodium as needed.   Patient expressed understanding and was in agreement with this plan. She also understands that She can call clinic at any time with any questions, concerns, or complaints.    Granberg Huger, MD   04/30/2015 7:10 PM

## 2015-04-30 NOTE — Progress Notes (Signed)
Patient's has diarrhea and started Immodium OTC yesterday but had loose stools this morning.  Has nausea that is relieved with nausea med.

## 2015-05-01 ENCOUNTER — Ambulatory Visit: Payer: Medicare Other

## 2015-05-01 ENCOUNTER — Ambulatory Visit: Payer: Medicare Other | Admitting: Pain Medicine

## 2015-05-01 ENCOUNTER — Encounter: Payer: Self-pay | Admitting: Pain Medicine

## 2015-05-01 ENCOUNTER — Ambulatory Visit
Admission: RE | Admit: 2015-05-01 | Discharge: 2015-05-01 | Disposition: A | Discharge status: Home / Self Care | Payer: Medicare Other | Source: Ambulatory Visit | Attending: Radiation Oncology | Admitting: Radiation Oncology

## 2015-05-01 VITALS — BP 105/73 | HR 106 | Temp 97.9°F | Resp 16 | Ht 65.0 in | Wt 246.0 lb

## 2015-05-01 DIAGNOSIS — C771 Secondary and unspecified malignant neoplasm of intrathoracic lymph nodes: Secondary | ICD-10-CM | POA: Diagnosis not present

## 2015-05-01 DIAGNOSIS — N189 Chronic kidney disease, unspecified: Secondary | ICD-10-CM | POA: Diagnosis not present

## 2015-05-01 DIAGNOSIS — Z87891 Personal history of nicotine dependence: Secondary | ICD-10-CM | POA: Diagnosis not present

## 2015-05-01 DIAGNOSIS — E119 Type 2 diabetes mellitus without complications: Secondary | ICD-10-CM | POA: Diagnosis not present

## 2015-05-01 DIAGNOSIS — F329 Major depressive disorder, single episode, unspecified: Secondary | ICD-10-CM | POA: Insufficient documentation

## 2015-05-01 DIAGNOSIS — E860 Dehydration: Secondary | ICD-10-CM | POA: Diagnosis not present

## 2015-05-01 DIAGNOSIS — R05 Cough: Secondary | ICD-10-CM | POA: Diagnosis not present

## 2015-05-01 DIAGNOSIS — M179 Osteoarthritis of knee, unspecified: Secondary | ICD-10-CM

## 2015-05-01 DIAGNOSIS — J449 Chronic obstructive pulmonary disease, unspecified: Secondary | ICD-10-CM | POA: Diagnosis not present

## 2015-05-01 DIAGNOSIS — M170 Bilateral primary osteoarthritis of knee: Secondary | ICD-10-CM

## 2015-05-01 DIAGNOSIS — M5136 Other intervertebral disc degeneration, lumbar region: Secondary | ICD-10-CM | POA: Insufficient documentation

## 2015-05-01 DIAGNOSIS — F419 Anxiety disorder, unspecified: Secondary | ICD-10-CM

## 2015-05-01 DIAGNOSIS — I509 Heart failure, unspecified: Secondary | ICD-10-CM | POA: Diagnosis not present

## 2015-05-01 DIAGNOSIS — N200 Calculus of kidney: Secondary | ICD-10-CM | POA: Insufficient documentation

## 2015-05-01 DIAGNOSIS — M5481 Occipital neuralgia: Secondary | ICD-10-CM

## 2015-05-01 DIAGNOSIS — Z853 Personal history of malignant neoplasm of breast: Secondary | ICD-10-CM | POA: Diagnosis not present

## 2015-05-01 DIAGNOSIS — C787 Secondary malignant neoplasm of liver and intrahepatic bile duct: Secondary | ICD-10-CM | POA: Diagnosis not present

## 2015-05-01 DIAGNOSIS — C3492 Malignant neoplasm of unspecified part of left bronchus or lung: Secondary | ICD-10-CM | POA: Diagnosis not present

## 2015-05-01 DIAGNOSIS — Z510 Encounter for antineoplastic radiation therapy: Secondary | ICD-10-CM | POA: Diagnosis present

## 2015-05-01 DIAGNOSIS — I129 Hypertensive chronic kidney disease with stage 1 through stage 4 chronic kidney disease, or unspecified chronic kidney disease: Secondary | ICD-10-CM | POA: Diagnosis not present

## 2015-05-01 MED ORDER — TRAMADOL HCL 50 MG PO TABS: ORAL_TABLET | ORAL | Status: DC

## 2015-05-01 NOTE — Patient Instructions (Addendum)
PLAN   Continue present medication tramadol. Caution tramadol can cause drowsiness respiratory depression confusion and other side effects be extremely cautious when taking tramad  Geniculate nerve blocks of need to be performed at time of return appointment  F/U PCP Dr.Mancheno Revelo  for evaliation of  BP and general medical  Condition  F/U oncology as planned  F/U surgical evaluation. May consider pending follow-up evaluations  F/U neurological evaluation. May consider pending follow-up evaluationsKnee Injection A knee injection is a procedure to get medicine into your knee joint. Your health care provider puts a needle into the joint and injects medicine with an attached syringe. The injected medicine may relieve the pain, swelling, and stiffness of arthritis. The injected medicine may also help to lubricate and cushion your knee joint. You may need more than one injection. LET [facility] CARE PROVIDER KNOW ABOUT:  Any allergies you have.  All medicines you are taking, including vitamins, herbs, eye drops, creams, and over-the-counter medicines.  Previous problems you or members of your family have had with the use of anesthetics.  Any blood disorders you have.  Previous surgeries you have had.  Any medical conditions you may have. RISKS AND COMPLICATIONS Generally, this is a safe procedure. However, problems may occur, including:  Infection.  Bleeding.  Worsening symptoms.  Damage to the area around your knee.  Allergic reaction to any of the medicines.  Skin reactions from repeated injections. BEFORE THE PROCEDURE  Ask your health care provider about changing or stopping your regular medicines. This is especially important if you are taking diabetes medicines or blood thinners.  Plan to have someone take you home after the procedure. PROCEDURE  You will sit or lie down in a position for your knee to be treated.  The skin over your kneecap will be cleaned  with a germ-killing solution (antiseptic).  You will be given a medicine that numbs the area (local anesthetic). You may feel some stinging.  After your knee becomes numb, you will have a second injection. This is the medicine. This needle is carefully placed between your kneecap and your knee. The medicine is injected into the joint space.  At the end of the procedure, the needle will be removed.  A bandage (dressing) may be placed over the injection site. The procedure may vary among health care providers and hospitals. AFTER THE PROCEDURE  You may have to move your knee through its full range of motion. This helps to get all of the medicine into your joint space.  Your blood pressure, heart rate, breathing rate, and blood oxygen level will be monitored often until the medicines you were given have worn off.  You will be watched to make sure that you do not have a reaction to the injected medicine.   This information is not intended to replace advice given to you by your health care provider. Make sure you discuss any questions you have with your health care provider.   Document Released: 09/07/2006 Document Revised: 07/07/2014 Document Reviewed: 04/26/2014 Elsevier Interactive Patient Education 2016 [employer]  What are the risk, side effects and possible complications? Generally speaking, most procedures are safe.  However, with any procedure there are risks, side effects, and the possibility of complications.  The risks and complications are dependent upon the sites that are lesioned, or the type of nerve block to be performed.  The closer the procedure is to the spine, the more serious the risks are.  [employer]  care is taken when placing the radio frequency needles, block needles or lesioning probes, but sometimes complications can occur.  Infection: Any time there is an injection through the skin, there is a risk of infection.  This is why sterile  conditions are used for these blocks.  There are four possible types of infection.  Localized skin infection.  Central Nervous System Infection-This can be in the form of Meningitis, which can be deadly.  Epidural Infections-This can be in the form of an epidural abscess, which can cause pressure inside of the spine, causing compression of the spinal cord with subsequent paralysis. This would require an emergency surgery to decompress, and there are no guarantees that the patient would recover from the paralysis.  Discitis-This is an infection of the intervertebral discs.  It occurs in about 1% of discography procedures.  It is difficult to treat and it may lead to surgery.        2. Pain: the needles have to go through skin and soft tissues, will cause soreness.       3. Damage to internal structures:  The nerves to be lesioned may be near blood vessels or    other nerves which can be potentially damaged.       4. Bleeding: Bleeding is more common if the patient is taking blood thinners such as  aspirin, Coumadin, Ticiid, Plavix, etc., or if he/she have some genetic predisposition  such as hemophilia. Bleeding into the spinal canal can cause compression of the spinal  cord with subsequent paralysis.  This would require an emergency surgery to  decompress and there are no guarantees that the patient would recover from the  paralysis.       5. Pneumothorax:  Puncturing of a lung is a possibility, every time a needle is introduced in  the area of the chest or upper back.  Pneumothorax refers to free air around the  collapsed lung(s), inside of the thoracic cavity (chest cavity).  Another two possible  complications related to a similar event would include: Hemothorax and Chylothorax.   These are variations of the Pneumothorax, where instead of air around the collapsed  lung(s), you may have blood or chyle, respectively.       6. Spinal headaches: They may occur with any procedures in the area of the  spine.       7. Persistent CSF (Cerebro-Spinal Fluid) leakage: This is a rare problem, but may occur  with prolonged intrathecal or epidural catheters either due to the formation of a fistulous  track or a dural tear.       8. Nerve damage: By working so close to the spinal cord, there is always a possibility of  nerve damage, which could be as serious as a permanent spinal cord injury with  paralysis.       9. Death:  Although rare, severe deadly allergic reactions known as "Anaphylactic  reaction" can occur to any of the medications used.      10. Worsening of the symptoms:  We can always make thing worse.  What are the chances of something like this happening? Chances of any of this occuring are extremely low.  By statistics, you have more of a chance of getting killed in a motor vehicle accident: while driving to the hospital than any of the above occurring .  Nevertheless, you should be aware that they are possibilities.  In general, it is similar to taking a shower.  Everybody knows that you  can slip, hit your head and get killed.  Does that mean that you should not shower again?  Nevertheless always keep in mind that statistics do not mean anything if you happen to be on the wrong side of them.  Even if a procedure has a 1 (one) in a 1,000,000 (million) chance of going wrong, it you happen to be that one..Also, keep in mind that by statistics, you have more of a chance of having something go wrong when taking medications.  Who should not have this procedure? If you are on a blood thinning medication (e.g. Coumadin, Plavix, see list of "Blood Thinners"), or if you have an active infection going on, you should not have the procedure.  If you are taking any blood thinners, please inform your physician.  How should I prepare for this procedure?  Do not eat or drink anything at least six hours prior to the procedure.  Bring a driver with you .  It cannot be a taxi.  Come accompanied by an adult  that can drive you back, and that is strong enough to help you if your legs get weak or numb from the local anesthetic.  Take all of your medicines the morning of the procedure with just enough water to swallow them.  If you have diabetes, make sure that you are scheduled to have your procedure done first thing in the morning, whenever possible.  If you have diabetes, take only half of your insulin dose and notify our nurse that you have done so as soon as you arrive at the clinic.  If you are diabetic, but only take blood sugar pills (oral hypoglycemic), then do not take them on the morning of your procedure.  You may take them after you have had the procedure.  Do not take aspirin or any aspirin-containing medications, at least eleven (11) days prior to the procedure.  They may prolong bleeding.  Wear loose fitting clothing that may be easy to take off and that you would not mind if it got stained with Betadine or blood.  Do not wear any jewelry or perfume  Remove any nail coloring.  It will interfere with some of our monitoring equipment.  NOTE: Remember that this is not meant to be interpreted as a complete list of all possible complications.  Unforeseen problems may occur.  BLOOD THINNERS The following drugs contain aspirin or other products, which can cause increased bleeding during surgery and should not be taken for 2 weeks prior to and 1 week after surgery.  If you should need take something for relief of minor pain, you may take acetaminophen which is found in Tylenol,m Datril, Anacin-3 and Panadol. It is not blood thinner. The products listed below are.  Do not take any of the products listed below in addition to any listed on your instruction sheet.  A.P.C or A.P.C with Codeine Codeine Phosphate Capsules #3 Ibuprofen Ridaura  ABC compound Congesprin Imuran rimadil  Advil Cope Indocin Robaxisal  Alka-Seltzer Effervescent Pain Reliever and Antacid Coricidin or Coricidin-D   Indomethacin Rufen  Alka-Seltzer plus Cold Medicine Cosprin Ketoprofen S-A-C Tablets  Anacin Analgesic Tablets or Capsules Coumadin Korlgesic Salflex  Anacin Extra Strength Analgesic tablets or capsules CP-2 Tablets Lanoril Salicylate  Anaprox Cuprimine Capsules Levenox Salocol  Anexsia-D Dalteparin Magan Salsalate  Anodynos Darvon compound Magnesium Salicylate Sine-off  Ansaid Dasin Capsules Magsal Sodium Salicylate  Anturane Depen Capsules Marnal Soma  APF Arthritis pain formula Dewitt's Pills Measurin Stanback  Argesic Dia-Gesic  Meclofenamic Sulfinpyrazone  Arthritis Bayer Timed Release Aspirin Diclofenac Meclomen Sulindac  Arthritis pain formula Anacin Dicumarol Medipren Supac  Analgesic (Safety coated) Arthralgen Diffunasal Mefanamic Suprofen  Arthritis Strength Bufferin Dihydrocodeine Mepro Compound Suprol  Arthropan liquid Dopirydamole Methcarbomol with Aspirin Synalgos  ASA tablets/Enseals Disalcid Micrainin Tagament  Ascriptin Doan's Midol Talwin  Ascriptin A/D Dolene Mobidin Tanderil  Ascriptin Extra Strength Dolobid Moblgesic Ticlid  Ascriptin with Codeine Doloprin or Doloprin with Codeine Momentum Tolectin  Asperbuf Duoprin Mono-gesic Trendar  Aspergum Duradyne Motrin or Motrin IB Triminicin  Aspirin plain, buffered or enteric coated Durasal Myochrisine Trigesic  Aspirin Suppositories Easprin Nalfon Trillsate  Aspirin with Codeine Ecotrin Regular or Extra Strength Naprosyn Uracel  Atromid-S Efficin Naproxen Ursinus  Auranofin Capsules Elmiron Neocylate Vanquish  Axotal Emagrin Norgesic Verin  Azathioprine Empirin or Empirin with Codeine Normiflo Vitamin E  Azolid Emprazil Nuprin Voltaren  Bayer Aspirin plain, buffered or children's or timed BC Tablets or powders Encaprin Orgaran Warfarin Sodium  Buff-a-Comp Enoxaparin Orudis Zorpin  Buff-a-Comp with Codeine Equegesic Os-Cal-Gesic   Buffaprin Excedrin plain, buffered or Extra Strength Oxalid   Bufferin Arthritis Strength  Feldene Oxphenbutazone   Bufferin plain or Extra Strength Feldene Capsules Oxycodone with Aspirin   Bufferin with Codeine Fenoprofen Fenoprofen Pabalate or Pabalate-SF   Buffets II Flogesic Panagesic   Buffinol plain or Extra Strength Florinal or Florinal with Codeine Panwarfarin   Buf-Tabs Flurbiprofen Penicillamine   Butalbital Compound Four-way cold tablets Penicillin   Butazolidin Fragmin Pepto-Bismol   Carbenicillin Geminisyn Percodan   Carna Arthritis Reliever Geopen Persantine   Carprofen Gold's salt Persistin   Chloramphenicol Goody's Phenylbutazone   Chloromycetin Haltrain Piroxlcam   Clmetidine heparin Plaquenil   Cllnoril Hyco-pap Ponstel   Clofibrate Hydroxy chloroquine Propoxyphen         Before stopping any of these medications, be sure to consult the physician who ordered them.  Some, such as Coumadin (Warfarin) are ordered to prevent or treat serious conditions such as "deep thrombosis", "pumonary embolisms", and other heart problems.  The amount of time that you may need off of the medication may also vary with the medication and the reason for which you were taking it.  If you are taking any of these medications, please make sure you notify your pain physician before you undergo any procedures.

## 2015-05-01 NOTE — Progress Notes (Signed)
   Subjective:    Patient ID: Sherry Prince, female    DOB: 13-Mar-1946, 69 y.o.   MRN: [id number]  HPI Patient is 69 year old female who returns to pain management for further evaluation and treatment of pain involving the region of the right knee. Patient has had significant improvement her pain of the right knee following geniculate nerve blocks of the knee. We will proceed with geniculate nerve blocks of the knee without steroid at time of return appointment and will consider patient for radiofrequency rhizolysis of the geniculate nerves should patient be with favorable response. Patient has had greater than 75% relief of pain with previous geniculate nerve blocks. We will request insurance approval for radiofrequency rhizolysis of the geniculate nerves. She was with understanding and agreement with suggested treatment plan   Review of Systems     Objective:   Physical Exam  There was tenderness of the splenius capitis and occipitalis regions of mild degree with mild tenderness over the cervical facet cervical paraspinal musculature region. There was mild tennis of the acromioclavicular and glenohumeral joint region and patient was with bilaterally equal grip strength. Tinel and Phalen's maneuver were without increased pain of significant degree. There was tends to palpation of the thoracic facet thoracic paraspinal muscles of mild degree with no crepitus of the thoracic region noted. Palpation over the region of the lumbar muscles lumbar paraspinal musculature region was a tenderness to palpation of mild to moderate degree. There was mild to moderate tenderness of the PSIS and PII S region as well as the greater trochanteric region and iliotibial band region. The knee was a tends to palpation with crepitus of the knee with negative anterior posterior drawer signs without ballottement of the patella. There was significant pain with range of motion maneuvers of the knee. EHL strength appeared to be  decreased. No sensory deficit of dermatomal distribution of the lower extremities noted. There was negative clonus negative Homans. Abdomen nontender with no costovertebral angle tenderness noted.                 Assessment & Plan:    Degenerative disc disease lumbar spine  Degenerative joint disease of knee  Diabetes mellitus  Carcinoma  stage IV  Nephrolithiasis  Anxiety /depression     PLAN   Continue present medication tramadol as prescribed  Geniculate nerve blocks of knee to be performed at time of return appointment  F/U PCP  Dr.Mancheno Revelo for evaliation of  BP and general medical  condition  F/U surgical evaluation. May consider pending follow-up evaluations  F/U neurological evaluation. May consider pending follow-up evaluations  F/U oncology as planned  May consider radiofrequency rhizolysis or intraspinal procedures pending response to present treatment and F/U evaluation   Patient to call [facility] should patient have concerns prior to scheduled return appointment.

## 2015-05-01 NOTE — Progress Notes (Signed)
Safety precautions to be maintained throughout the outpatient stay will include: orient to surroundings, keep bed in low position, maintain call bell within reach at all times, provide assistance with transfer out of bed and ambulation.  

## 2015-05-02 ENCOUNTER — Observation Stay: Payer: Medicare Other

## 2015-05-02 ENCOUNTER — Ambulatory Visit: Payer: Medicare Other

## 2015-05-02 ENCOUNTER — Inpatient Hospital Stay
Admission: EM | Admit: 2015-05-02 | Discharge: 2015-05-09 | DRG: 640 | Disposition: A | Discharge status: Hospice | Payer: Medicare Other | Attending: Internal Medicine | Admitting: Internal Medicine

## 2015-05-02 ENCOUNTER — Other Ambulatory Visit: Payer: Self-pay

## 2015-05-02 DIAGNOSIS — N189 Chronic kidney disease, unspecified: Secondary | ICD-10-CM | POA: Diagnosis present

## 2015-05-02 DIAGNOSIS — K219 Gastro-esophageal reflux disease without esophagitis: Secondary | ICD-10-CM | POA: Diagnosis present

## 2015-05-02 DIAGNOSIS — Z6841 Body Mass Index (BMI) 40.0 and over, adult: Secondary | ICD-10-CM

## 2015-05-02 DIAGNOSIS — T451X5A Adverse effect of antineoplastic and immunosuppressive drugs, initial encounter: Secondary | ICD-10-CM | POA: Diagnosis present

## 2015-05-02 DIAGNOSIS — E872 Acidosis: Secondary | ICD-10-CM | POA: Diagnosis not present

## 2015-05-02 DIAGNOSIS — R059 Cough, unspecified: Secondary | ICD-10-CM

## 2015-05-02 DIAGNOSIS — Z515 Encounter for palliative care: Secondary | ICD-10-CM | POA: Diagnosis not present

## 2015-05-02 DIAGNOSIS — E669 Obesity, unspecified: Secondary | ICD-10-CM | POA: Diagnosis present

## 2015-05-02 DIAGNOSIS — R509 Fever, unspecified: Secondary | ICD-10-CM

## 2015-05-02 DIAGNOSIS — F329 Major depressive disorder, single episode, unspecified: Secondary | ICD-10-CM | POA: Diagnosis present

## 2015-05-02 DIAGNOSIS — Z8541 Personal history of malignant neoplasm of cervix uteri: Secondary | ICD-10-CM

## 2015-05-02 DIAGNOSIS — Z7984 Long term (current) use of oral hypoglycemic drugs: Secondary | ICD-10-CM

## 2015-05-02 DIAGNOSIS — Z87891 Personal history of nicotine dependence: Secondary | ICD-10-CM

## 2015-05-02 DIAGNOSIS — G9341 Metabolic encephalopathy: Secondary | ICD-10-CM | POA: Diagnosis not present

## 2015-05-02 DIAGNOSIS — I509 Heart failure, unspecified: Secondary | ICD-10-CM | POA: Diagnosis present

## 2015-05-02 DIAGNOSIS — Z79899 Other long term (current) drug therapy: Secondary | ICD-10-CM

## 2015-05-02 DIAGNOSIS — M179 Osteoarthritis of knee, unspecified: Secondary | ICD-10-CM | POA: Diagnosis present

## 2015-05-02 DIAGNOSIS — D61818 Other pancytopenia: Secondary | ICD-10-CM

## 2015-05-02 DIAGNOSIS — Z91018 Allergy to other foods: Secondary | ICD-10-CM

## 2015-05-02 DIAGNOSIS — J9621 Acute and chronic respiratory failure with hypoxia: Secondary | ICD-10-CM | POA: Diagnosis not present

## 2015-05-02 DIAGNOSIS — I959 Hypotension, unspecified: Secondary | ICD-10-CM | POA: Diagnosis not present

## 2015-05-02 DIAGNOSIS — E1122 Type 2 diabetes mellitus with diabetic chronic kidney disease: Secondary | ICD-10-CM | POA: Diagnosis present

## 2015-05-02 DIAGNOSIS — Z923 Personal history of irradiation: Secondary | ICD-10-CM

## 2015-05-02 DIAGNOSIS — Z66 Do not resuscitate: Secondary | ICD-10-CM | POA: Diagnosis present

## 2015-05-02 DIAGNOSIS — N179 Acute kidney failure, unspecified: Secondary | ICD-10-CM | POA: Diagnosis present

## 2015-05-02 DIAGNOSIS — Z880 Allergy status to penicillin: Secondary | ICD-10-CM

## 2015-05-02 DIAGNOSIS — I129 Hypertensive chronic kidney disease with stage 1 through stage 4 chronic kidney disease, or unspecified chronic kidney disease: Secondary | ICD-10-CM | POA: Diagnosis present

## 2015-05-02 DIAGNOSIS — J189 Pneumonia, unspecified organism: Secondary | ICD-10-CM

## 2015-05-02 DIAGNOSIS — Z7951 Long term (current) use of inhaled steroids: Secondary | ICD-10-CM

## 2015-05-02 DIAGNOSIS — D61810 Antineoplastic chemotherapy induced pancytopenia: Secondary | ICD-10-CM | POA: Diagnosis present

## 2015-05-02 DIAGNOSIS — J449 Chronic obstructive pulmonary disease, unspecified: Secondary | ICD-10-CM | POA: Diagnosis present

## 2015-05-02 DIAGNOSIS — R112 Nausea with vomiting, unspecified: Secondary | ICD-10-CM | POA: Diagnosis present

## 2015-05-02 DIAGNOSIS — C3402 Malignant neoplasm of left main bronchus: Secondary | ICD-10-CM | POA: Diagnosis present

## 2015-05-02 DIAGNOSIS — F419 Anxiety disorder, unspecified: Secondary | ICD-10-CM | POA: Diagnosis present

## 2015-05-02 DIAGNOSIS — R05 Cough: Secondary | ICD-10-CM

## 2015-05-02 DIAGNOSIS — M5136 Other intervertebral disc degeneration, lumbar region: Secondary | ICD-10-CM | POA: Diagnosis present

## 2015-05-02 DIAGNOSIS — G4733 Obstructive sleep apnea (adult) (pediatric): Secondary | ICD-10-CM | POA: Diagnosis present

## 2015-05-02 DIAGNOSIS — E876 Hypokalemia: Secondary | ICD-10-CM | POA: Diagnosis present

## 2015-05-02 DIAGNOSIS — E43 Unspecified severe protein-calorie malnutrition: Secondary | ICD-10-CM | POA: Diagnosis present

## 2015-05-02 DIAGNOSIS — Z9981 Dependence on supplemental oxygen: Secondary | ICD-10-CM

## 2015-05-02 DIAGNOSIS — D701 Agranulocytosis secondary to cancer chemotherapy: Secondary | ICD-10-CM | POA: Diagnosis present

## 2015-05-02 DIAGNOSIS — R5081 Fever presenting with conditions classified elsewhere: Secondary | ICD-10-CM | POA: Diagnosis present

## 2015-05-02 DIAGNOSIS — E860 Dehydration: Principal | ICD-10-CM | POA: Diagnosis present

## 2015-05-02 DIAGNOSIS — Z809 Family history of malignant neoplasm, unspecified: Secondary | ICD-10-CM

## 2015-05-02 DIAGNOSIS — Z853 Personal history of malignant neoplasm of breast: Secondary | ICD-10-CM

## 2015-05-02 DIAGNOSIS — C787 Secondary malignant neoplasm of liver and intrahepatic bile duct: Secondary | ICD-10-CM | POA: Diagnosis present

## 2015-05-02 LAB — COMPREHENSIVE METABOLIC PANEL WITH GFR
ALT: 25 U/L (ref 14–54)
AST: 21 U/L (ref 15–41)
Albumin: 3.1 g/dL — ABNORMAL LOW (ref 3.5–5.0)
Alkaline Phosphatase: 183 U/L — ABNORMAL HIGH (ref 38–126)
Anion gap: 12 (ref 5–15)
BUN: 25 mg/dL — ABNORMAL HIGH (ref 6–20)
CO2: 24 mmol/L (ref 22–32)
Calcium: 7.4 mg/dL — ABNORMAL LOW (ref 8.9–10.3)
Chloride: 99 mmol/L — ABNORMAL LOW (ref 101–111)
Creatinine, Ser: 1.14 mg/dL — ABNORMAL HIGH (ref 0.44–1.00)
GFR calc Af Amer: 56 mL/min — ABNORMAL LOW
GFR calc non Af Amer: 48 mL/min — ABNORMAL LOW
Glucose, Bld: 279 mg/dL — ABNORMAL HIGH (ref 65–99)
Potassium: 3.9 mmol/L (ref 3.5–5.1)
Sodium: 135 mmol/L (ref 135–145)
Total Bilirubin: 0.8 mg/dL (ref 0.3–1.2)
Total Protein: 6.6 g/dL (ref 6.5–8.1)

## 2015-05-02 LAB — CBC
HCT: 34.8 % — ABNORMAL LOW (ref 35.0–47.0)
Hemoglobin: 11.4 g/dL — ABNORMAL LOW (ref 12.0–16.0)
MCH: 28.3 pg (ref 26.0–34.0)
MCHC: 32.6 g/dL (ref 32.0–36.0)
MCV: 86.8 fL (ref 80.0–100.0)
Platelets: 108 K/uL — ABNORMAL LOW (ref 150–440)
RBC: 4.01 MIL/uL (ref 3.80–5.20)
RDW: 14.3 % (ref 11.5–14.5)
WBC: 0.4 K/uL — CL (ref 3.6–11.0)

## 2015-05-02 LAB — TROPONIN I

## 2015-05-02 LAB — HEMOGLOBIN A1C: HEMOGLOBIN A1C: 9.6 % — AB (ref 4.0–6.0)

## 2015-05-02 LAB — LIPASE, BLOOD: Lipase: 20 U/L (ref 11–51)

## 2015-05-02 LAB — GLUCOSE, CAPILLARY
GLUCOSE-CAPILLARY: 329 mg/dL — AB (ref 65–99)
GLUCOSE-CAPILLARY: 181 mg/dL — AB (ref 65–99)

## 2015-05-02 MED ORDER — CITALOPRAM HYDROBROMIDE 20 MG PO TABS
40.0000 mg | ORAL_TABLET | Freq: Every morning | ORAL | Status: DC
  Administered 2015-05-02 – 2015-05-06 (×5): 40 mg via ORAL
  Filled 2015-05-02 (×6): qty 2

## 2015-05-02 MED ORDER — ACETAMINOPHEN 325 MG PO TABS
650.0000 mg | ORAL_TABLET | Freq: Four times a day (QID) | ORAL | Status: DC | PRN
  Administered 2015-05-03 (×2): 650 mg via ORAL
  Filled 2015-05-02 (×2): qty 2

## 2015-05-02 MED ORDER — TRAZODONE HCL 50 MG PO TABS
50.0000 mg | ORAL_TABLET | Freq: Every day | ORAL | Status: DC
  Administered 2015-05-02 – 2015-05-07 (×6): 50 mg via ORAL
  Filled 2015-05-02 (×6): qty 1

## 2015-05-02 MED ORDER — LORATADINE 10 MG PO TABS
10.0000 mg | ORAL_TABLET | Freq: Every day | ORAL | Status: DC
  Administered 2015-05-02 – 2015-05-06 (×5): 10 mg via ORAL
  Filled 2015-05-02 (×6): qty 1

## 2015-05-02 MED ORDER — METFORMIN HCL 500 MG PO TABS
1000.0000 mg | ORAL_TABLET | Freq: Two times a day (BID) | ORAL | Status: DC
  Administered 2015-05-02 – 2015-05-04 (×3): 1000 mg via ORAL
  Filled 2015-05-02 (×3): qty 2

## 2015-05-02 MED ORDER — ALBUTEROL SULFATE (2.5 MG/3ML) 0.083% IN NEBU
2.5000 mg | INHALATION_SOLUTION | RESPIRATORY_TRACT | Status: DC | PRN
  Administered 2015-05-04 – 2015-05-05 (×4): 2.5 mg via RESPIRATORY_TRACT
  Filled 2015-05-02 (×4): qty 3

## 2015-05-02 MED ORDER — ONDANSETRON HCL 4 MG/2ML IJ SOLN
4.0000 mg | Freq: Once | INTRAMUSCULAR | Status: AC
  Administered 2015-05-02: 4 mg via INTRAVENOUS

## 2015-05-02 MED ORDER — INSULIN ASPART 100 UNIT/ML ~~LOC~~ SOLN
0.0000 [IU] | Freq: Three times a day (TID) | SUBCUTANEOUS | Status: DC
  Administered 2015-05-02: 7 [IU] via SUBCUTANEOUS
  Administered 2015-05-03: 1 [IU] via SUBCUTANEOUS
  Administered 2015-05-03: 3 [IU] via SUBCUTANEOUS
  Administered 2015-05-04: 2 [IU] via SUBCUTANEOUS
  Administered 2015-05-04 – 2015-05-05 (×2): 1 [IU] via SUBCUTANEOUS
  Administered 2015-05-05: 12:00:00 2 [IU] via SUBCUTANEOUS
  Administered 2015-05-06 – 2015-05-07 (×3): 1 [IU] via SUBCUTANEOUS
  Administered 2015-05-07: 2 [IU] via SUBCUTANEOUS
  Administered 2015-05-08: 3 [IU] via SUBCUTANEOUS
  Administered 2015-05-08: 11:00:00 1 [IU] via SUBCUTANEOUS
  Filled 2015-05-02: qty 3
  Filled 2015-05-02 (×2): qty 4
  Filled 2015-05-02: qty 5
  Filled 2015-05-02: qty 4
  Filled 2015-05-02: qty 1
  Filled 2015-05-02: qty 3
  Filled 2015-05-02: qty 5
  Filled 2015-05-02: qty 7
  Filled 2015-05-02: qty 4
  Filled 2015-05-02: qty 1
  Filled 2015-05-02: qty 3
  Filled 2015-05-02: qty 2

## 2015-05-02 MED ORDER — POLYVINYL ALCOHOL 1.4 % OP SOLN
1.0000 [drp] | OPHTHALMIC | Status: DC | PRN
  Filled 2015-05-02: qty 15

## 2015-05-02 MED ORDER — HYDROCODONE-ACETAMINOPHEN 5-325 MG PO TABS
1.0000 | ORAL_TABLET | Freq: Four times a day (QID) | ORAL | Status: DC | PRN
  Administered 2015-05-03 – 2015-05-07 (×3): 1 via ORAL
  Filled 2015-05-02 (×3): qty 1

## 2015-05-02 MED ORDER — SODIUM CHLORIDE 0.9 % IJ SOLN
3.0000 mL | Freq: Two times a day (BID) | INTRAMUSCULAR | Status: DC
  Administered 2015-05-03 – 2015-05-07 (×7): 3 mL via INTRAVENOUS
  Administered 2015-05-07: 5 mL via INTRAVENOUS
  Administered 2015-05-08 – 2015-05-09 (×2): 3 mL via INTRAVENOUS

## 2015-05-02 MED ORDER — PROCHLORPERAZINE MALEATE 10 MG PO TABS
10.0000 mg | ORAL_TABLET | Freq: Four times a day (QID) | ORAL | Status: DC | PRN
  Administered 2015-05-04 – 2015-05-07 (×4): 10 mg via ORAL
  Filled 2015-05-02 (×4): qty 1

## 2015-05-02 MED ORDER — LORAZEPAM 1 MG PO TABS
1.0000 mg | ORAL_TABLET | Freq: Three times a day (TID) | ORAL | Status: DC | PRN
  Administered 2015-05-04 – 2015-05-08 (×4): 1 mg via ORAL
  Filled 2015-05-02 (×5): qty 1

## 2015-05-02 MED ORDER — ATORVASTATIN CALCIUM 20 MG PO TABS
40.0000 mg | ORAL_TABLET | Freq: Every day | ORAL | Status: DC
  Administered 2015-05-02 – 2015-05-06 (×5): 40 mg via ORAL
  Filled 2015-05-02 (×6): qty 2

## 2015-05-02 MED ORDER — GABAPENTIN 300 MG PO CAPS
300.0000 mg | ORAL_CAPSULE | Freq: Every day | ORAL | Status: DC
  Administered 2015-05-02 – 2015-05-07 (×6): 300 mg via ORAL
  Filled 2015-05-02 (×6): qty 1

## 2015-05-02 MED ORDER — BUPROPION HCL ER (SR) 100 MG PO TB12
100.0000 mg | ORAL_TABLET | Freq: Every day | ORAL | Status: DC
  Administered 2015-05-02 – 2015-05-06 (×4): 100 mg via ORAL
  Filled 2015-05-02 (×6): qty 1

## 2015-05-02 MED ORDER — POTASSIUM CHLORIDE IN NACL 20-0.9 MEQ/L-% IV SOLN
INTRAVENOUS | Status: DC
  Administered 2015-05-02 – 2015-05-05 (×7): via INTRAVENOUS
  Filled 2015-05-02 (×9): qty 1000

## 2015-05-02 MED ORDER — BENZONATATE 100 MG PO CAPS
200.0000 mg | ORAL_CAPSULE | Freq: Three times a day (TID) | ORAL | Status: DC | PRN
  Administered 2015-05-05: 200 mg via ORAL
  Filled 2015-05-02: qty 2

## 2015-05-02 MED ORDER — INSULIN DETEMIR 100 UNIT/ML ~~LOC~~ SOLN
35.0000 [IU] | Freq: Every day | SUBCUTANEOUS | Status: DC
  Administered 2015-05-02 – 2015-05-07 (×4): 35 [IU] via SUBCUTANEOUS
  Filled 2015-05-02 (×7): qty 0.35

## 2015-05-02 MED ORDER — ACETAMINOPHEN 500 MG PO TABS
1000.0000 mg | ORAL_TABLET | Freq: Once | ORAL | Status: AC
  Administered 2015-05-02: 1000 mg via ORAL

## 2015-05-02 MED ORDER — SODIUM CHLORIDE 0.9 % IV BOLUS (SEPSIS)
500.0000 mL | Freq: Once | INTRAVENOUS | Status: AC
  Administered 2015-05-02: 500 mL via INTRAVENOUS

## 2015-05-02 MED ORDER — ALBUTEROL SULFATE (2.5 MG/3ML) 0.083% IN NEBU
2.5000 mg | INHALATION_SOLUTION | Freq: Once | RESPIRATORY_TRACT | Status: AC
  Administered 2015-05-02: 2.5 mg via RESPIRATORY_TRACT
  Filled 2015-05-02: qty 3

## 2015-05-02 MED ORDER — LEVOFLOXACIN IN D5W 750 MG/150ML IV SOLN
750.0000 mg | INTRAVENOUS | Status: DC
  Administered 2015-05-02: 750 mg via INTRAVENOUS
  Filled 2015-05-02: qty 150

## 2015-05-02 MED ORDER — PANTOPRAZOLE SODIUM 40 MG PO TBEC
40.0000 mg | DELAYED_RELEASE_TABLET | Freq: Every day | ORAL | Status: DC
  Administered 2015-05-02 – 2015-05-06 (×5): 40 mg via ORAL
  Filled 2015-05-02 (×7): qty 1

## 2015-05-02 MED ORDER — INSULIN ASPART 100 UNIT/ML ~~LOC~~ SOLN
3.0000 [IU] | Freq: Three times a day (TID) | SUBCUTANEOUS | Status: DC
  Administered 2015-05-02 – 2015-05-07 (×14): 3 [IU] via SUBCUTANEOUS
  Filled 2015-05-02 (×8): qty 3

## 2015-05-02 MED ORDER — FLUTICASONE PROPIONATE 50 MCG/ACT NA SUSP
1.0000 | Freq: Every day | NASAL | Status: DC | PRN
  Filled 2015-05-02: qty 16

## 2015-05-02 MED ORDER — ENOXAPARIN SODIUM 40 MG/0.4ML ~~LOC~~ SOLN
40.0000 mg | Freq: Two times a day (BID) | SUBCUTANEOUS | Status: DC
  Administered 2015-05-02 – 2015-05-04 (×5): 40 mg via SUBCUTANEOUS
  Filled 2015-05-02 (×5): qty 0.4

## 2015-05-02 MED ORDER — ACETAMINOPHEN 650 MG RE SUPP: 650.0000 mg | Freq: Four times a day (QID) | RECTAL | Status: DC | PRN

## 2015-05-02 MED ORDER — PROMETHAZINE HCL 25 MG PO TABS
25.0000 mg | ORAL_TABLET | Freq: Four times a day (QID) | ORAL | Status: DC | PRN
  Administered 2015-05-02 – 2015-05-06 (×4): 25 mg via ORAL
  Filled 2015-05-02 (×4): qty 1

## 2015-05-02 MED ORDER — TIOTROPIUM BROMIDE MONOHYDRATE 18 MCG IN CAPS
1.0000 | ORAL_CAPSULE | Freq: Every day | RESPIRATORY_TRACT | Status: DC
  Administered 2015-05-02 – 2015-05-06 (×5): 18 ug via RESPIRATORY_TRACT
  Filled 2015-05-02 (×2): qty 5

## 2015-05-02 MED ORDER — ACETAMINOPHEN 500 MG PO TABS
ORAL_TABLET | ORAL | Status: AC
  Administered 2015-05-02: 1000 mg via ORAL
  Filled 2015-05-02: qty 2

## 2015-05-02 MED ORDER — BUDESONIDE-FORMOTEROL FUMARATE 160-4.5 MCG/ACT IN AERO
2.0000 | INHALATION_SPRAY | Freq: Two times a day (BID) | RESPIRATORY_TRACT | Status: DC
  Administered 2015-05-02 – 2015-05-07 (×11): 2 via RESPIRATORY_TRACT
  Filled 2015-05-02: qty 6

## 2015-05-02 MED ORDER — CEFTAZIDIME 2 G IJ SOLR
2.0000 g | Freq: Three times a day (TID) | INTRAMUSCULAR | Status: DC
  Administered 2015-05-02 – 2015-05-04 (×6): 2 g via INTRAVENOUS
  Filled 2015-05-02 (×8): qty 2

## 2015-05-02 MED ORDER — LORAZEPAM 2 MG/ML IJ SOLN
1.0000 mg | Freq: Once | INTRAMUSCULAR | Status: AC
  Administered 2015-05-02: 1 mg via INTRAVENOUS
  Filled 2015-05-02: qty 1

## 2015-05-02 MED ORDER — MORPHINE SULFATE (PF) 2 MG/ML IV SOLN
2.0000 mg | INTRAVENOUS | Status: DC | PRN
  Administered 2015-05-04 – 2015-05-08 (×2): 2 mg via INTRAVENOUS
  Filled 2015-05-02 (×2): qty 1

## 2015-05-02 MED ORDER — ONDANSETRON HCL 4 MG/2ML IJ SOLN
INTRAMUSCULAR | Status: AC
  Filled 2015-05-02: qty 2

## 2015-05-02 NOTE — ED Notes (Signed)
Back from x-ray  Pt is now febrile  Labs drawn and tylenol given.

## 2015-05-02 NOTE — Plan of Care (Signed)
Problem: Education: Goal: Knowledge of [facility] Education information/materials will improve Outcome: Progressing Oriented pt and family  To [place] system  Problem: Safety: Goal: Ability to remain free from injury will improve Bed alarm in use assisted pt to bsc to void and  Have bm

## 2015-05-02 NOTE — ED Notes (Signed)
Pt repositioned..taking ice chips..tolerating well

## 2015-05-02 NOTE — Progress Notes (Signed)
Pt slept this pm. C/o nausea/ med given and pt says med helped some. Assisted to bsc to void and have loose smelly stools. Pt on cdiff precautions. 02 in use 2l [place].

## 2015-05-02 NOTE — Care Management Obs Status (Signed)
[place] NOTIFICATION   Patient Details  Name: CHRISTABELLA ALVIRA MRN: [id number] Date of Birth: [birth year]   Medicare Observation Status Notification Given:  Yes. Given to patient in ER 15. Signed copy sent to HIM.    Beau Fanny, RN 05/02/2015, 9:49 AM

## 2015-05-02 NOTE — Progress Notes (Signed)
Lovenox '40mg'$  q 24 hours was changed to lovenox '40mg'$  q 12 hours per protocol. Per protocol pt with a BMI>40 and CrCl >30, will automatically be switched to lovenox '40mg'$  q 12 hours Shiryl Ruddy D Deandria Klute, Pharm.D Clinical Pharmacist

## 2015-05-02 NOTE — ED Notes (Signed)
Feels better after svn.. Taking po fluids

## 2015-05-02 NOTE — ED Provider Notes (Signed)
[facility] Emergency Department Provider Note  ____________________________________________  Time seen: Approximately 520 AM  I have reviewed the triage vital signs and the nursing notes.   HISTORY  Chief Complaint Emesis    HPI Sherry Prince is a 69 y.o. female with a history of small cell lung cancer who comes in today with vomiting. The patient reports that she receive chemotherapy 1 week ago and started radiation yesterday. Per EMS the patient started vomiting after she arrived home. The patient has been dry heaving for multiple hours. The patient initially reported that she did not take anything for nausea but according to EMS the patient did take Phenergan. The patient denies any abdominal pain and denies any chest pain. She reports her emesis is green in appearance but denies any abdominal surgery. The patient also has had 2 episodes of diarrhea today. The patient reports that she has been unable to sleep given the constant dry heaving so she comes in today for evaluation.   Past Medical History  Diagnosis Date  . Diabetes mellitus without complication ([place])   . Chronic kidney disease   . GERD (gastroesophageal reflux disease)   . Hypertension   . Ear pain   . Serum calcium elevated   . Floaters   . Sleep apnea   . Cancer ([place])   . Liver cancer ([place])   . Cervical cancer ([place])   . Bone cancer ([place])   . CHF (congestive heart failure) ([place])   . COPD (chronic obstructive pulmonary disease) [facility])     Patient Active Problem List   Diagnosis Date Noted  . Small cell lung cancer ([place]) 04/20/2015  . Lung mass 03/24/2015  . Bilateral occipital neuralgia 03/20/2015  . DJD (degenerative joint disease) of knee 02/08/2015  . DDD (degenerative disc disease), lumbar 02/08/2015    Past Surgical History  Procedure Laterality Date  . Abdominal hysterectomy    . Breast surgery    . Eye surgery    . Endobronchial ultrasound N/A 03/29/2015    Procedure:  ENDOBRONCHIAL ULTRASOUND;  Surgeon: Flora Lipps, MD;  Location: [facility] ORS;  Service: Cardiopulmonary;  Laterality: N/A;    Current Outpatient Rx  Name  Route  Sig  Dispense  Refill  . albuterol (PROVENTIL) (2.5 MG/3ML) 0.083% nebulizer solution   Nebulization   Take 2.5 mg by nebulization every 2 (two) hours as needed for wheezing or shortness of breath.         . ARTIFICIAL TEARS 0.1-0.3 % SOLN   Both Eyes   Place 1 drop into both eyes as needed (dry eyes).          Marland Kitchen atorvastatin (LIPITOR) 40 MG tablet   Oral   Take 40 mg by mouth daily.         . budesonide-formoterol (SYMBICORT) 160-4.5 MCG/ACT inhaler   Inhalation   Inhale 2 puffs into the lungs 2 (two) times daily.         Marland Kitchen buPROPion (WELLBUTRIN SR) 100 MG 12 hr tablet   Oral   Take 100 mg by mouth daily.         . citalopram (CELEXA) 40 MG tablet   Oral   Take 40 mg by mouth every morning.         . fluticasone (FLONASE) 50 MCG/ACT nasal spray   Nasal   Place 1 spray into the nose daily as needed for allergies or rhinitis.          . furosemide (LASIX) 40  MG tablet   Oral   Take 1 tablet (40 mg total) by mouth daily.   30 tablet   0   . gabapentin (NEURONTIN) 300 MG capsule   Oral   Take 300 mg by mouth at bedtime.         . insulin detemir (LEVEMIR) 100 UNIT/ML injection   Subcutaneous   Inject 35 Units into the skin at bedtime.         . isosorbide mononitrate (IMDUR) 30 MG 24 hr tablet   Oral   Take 30 mg by mouth daily.         Marland Kitchen lisinopril (PRINIVIL,ZESTRIL) 10 MG tablet   Oral   Take 10 mg by mouth daily.         Marland Kitchen loratadine (CLARITIN) 10 MG tablet   Oral   Take 10 mg by mouth daily.         Marland Kitchen LORazepam (ATIVAN) 1 MG tablet   Oral   Take 1 mg by mouth every 8 (eight) hours as needed for anxiety.         . metFORMIN (GLUCOPHAGE) 1000 MG tablet   Oral   Take 1,000 mg by mouth 2 (two) times daily with a meal.          . metoprolol tartrate (LOPRESSOR) 25 MG  tablet   Oral   Take 25 mg by mouth 2 (two) times daily.         Marland Kitchen omeprazole (PRILOSEC) 20 MG capsule   Oral   Take 20 mg by mouth 2 (two) times daily before a meal.         . promethazine (PHENERGAN) 25 MG tablet   Oral   Take 25 mg by mouth every 6 (six) hours as needed for nausea or vomiting.         . tiotropium (SPIRIVA HANDIHALER) 18 MCG inhalation capsule   Inhalation   Place 1 capsule into inhaler and inhale daily.         . traZODone (DESYREL) 50 MG tablet   Oral   Take 1 tablet (50 mg total) by mouth at bedtime.   30 tablet   2   . HYDROcodone-acetaminophen (NORCO/VICODIN) 5-325 MG tablet   Oral   Take 1-2 tablets by mouth every 6 (six) hours as needed for moderate pain or severe pain.   20 tablet   0   . lidocaine-prilocaine (EMLA) cream      Apply to affected area once   30 g   3   . prochlorperazine (COMPAZINE) 10 MG tablet   Oral   Take 1 tablet (10 mg total) by mouth every 6 (six) hours as needed (Nausea or vomiting).   30 tablet   1     Allergies Peanut oil and Penicillins  Family History  Problem Relation Age of Onset  . Cancer Father     Social History Social History  Substance Use Topics  . Smoking status: Former Research scientist (life sciences)  . Smokeless tobacco: Not on file  . Alcohol Use: No    Review of Systems Constitutional: No fever/chills Eyes: No visual changes. ENT: No sore throat. Cardiovascular: Denies chest pain. Respiratory: Denies shortness of breath. Gastrointestinal: Nausea and vomiting with No abdominal pain  No diarrhea.  No constipation. Genitourinary: Negative for dysuria. Musculoskeletal: Negative for back pain. Skin: Negative for rash. Neurological: Negative for headaches, focal weakness or numbness.  10-point ROS otherwise negative.  ____________________________________________   PHYSICAL EXAM:  VITAL SIGNS: ED Triage Vitals  Enc Vitals Group     BP 05/02/15 0524 118/97 mmHg     Pulse Rate 05/02/15 0524 124      Resp --      Temp 05/02/15 0524 98.4 F (36.9 C)     Temp Source 05/02/15 0524 Oral     SpO2 05/02/15 0524 95 %     Weight 05/02/15 0524 246 lb (111.585 kg)     Height 05/02/15 0524 '5\' 5"'$  (1.651 m)     Head Cir --      Peak Flow --      Pain Score 05/02/15 0524 0     Pain Loc --      Pain Edu? --      Excl. in [place]? --     Constitutional: Alert and oriented. Ill appearing and in moderate distress. Eyes: Conjunctivae are normal. PERRL. EOMI. Head: Atraumatic. Nose: No congestion/rhinnorhea. Mouth/Throat: Mucous membranes are moist.  Oropharynx non-erythematous. Cardiovascular: tachycardia, regular rhythm. Grossly normal heart sounds.  Good peripheral circulation. Respiratory: Normal respiratory effort.  No retractions. Lungs CTAB. Gastrointestinal: Soft and nontender. No distention. No abdominal bruits. No CVA tenderness. Musculoskeletal: No lower extremity tenderness nor edema.   Neurologic:  Normal speech and language.  Skin:  Skin is warm, dry and intact. Some skin pallor noted Psychiatric: Mood and affect are normal.   ____________________________________________   LABS (all labs ordered are listed, but only abnormal results are displayed)  Labs Reviewed  CBC - Abnormal; Notable for the following:    WBC 0.4 (*)    Hemoglobin 11.4 (*)    HCT 34.8 (*)    Platelets 108 (*)    All other components within normal limits  COMPREHENSIVE METABOLIC PANEL - Abnormal; Notable for the following:    Chloride 99 (*)    Glucose, Bld 279 (*)    BUN 25 (*)    Creatinine, Ser 1.14 (*)    Calcium 7.4 (*)    Albumin 3.1 (*)    Alkaline Phosphatase 183 (*)    GFR calc non Af Amer 48 (*)    GFR calc Af Amer 56 (*)    All other components within normal limits  LIPASE, BLOOD  TROPONIN I  URINALYSIS COMPLETEWITH MICROSCOPIC ([facility] ONLY)   ____________________________________________  EKG  ED ECG REPORT I, Loney Hering, the attending physician, personally viewed and  interpreted this ECG.   Date: 05/02/2015  EKG Time: 542  Rate: 118  Rhythm: sinus tachycardia  Axis: left axis  Intervals:none  ST&T Change: none  ____________________________________________  RADIOLOGY  none ____________________________________________   PROCEDURES  Procedure(s) performed: None  Critical Care performed: No  ____________________________________________   INITIAL IMPRESSION / ASSESSMENT AND PLAN / ED COURSE  Pertinent labs & imaging results that were available during my care of the patient were reviewed by me and considered in my medical decision making (see chart for details).  This is a 69 year old female who comes in today with vomiting after receiving radiation. I did give the patient 500 ML's of normal saline since she is tachycardic as well as Zofran. I will await the results of the patient's blood work and then reassess the patient and determine what further studies are warranted.  After the initial bolus i gave the patient another bolus of NS and a duoneb. She continued to have tachycardia so i contacted oncology and decided to admit the patient to hospital for further hydration.  I will also give the patient a dose of ativan for her  nerves. The patient has been able to eat some ice chips and drink some water after the initial dose of zofran.   ____________________________________________   FINAL CLINICAL IMPRESSION(S) / ED DIAGNOSES  Final diagnoses:  Non-intractable vomiting with nausea, vomiting of unspecified type  Dehydration  Chemotherapy-induced neutropenia ([place])      Loney Hering, MD 05/02/15 [phone]

## 2015-05-02 NOTE — ED Notes (Signed)
ems pt from home pt has history of Lung CA recent chemo and radiation has been vomiting since yesterday taking phenergan with no improvment

## 2015-05-02 NOTE — H&P (Signed)
[place] [facility] [place] NAME: Sherry Prince    MR#:  [id number]  DATE OF BIRTH:  [birth year]  DATE OF ADMISSION:  05/02/2015  PRIMARY CARE PHYSICIAN: Theotis Burrow, MD   REQUESTING/REFERRING PHYSICIAN:   CHIEF COMPLAINT:   Chief Complaint  Patient presents with  . Emesis    ems pt from home pt has history of Lung CA recent chemo and radiation has been vomiting since yesterday taking phenergan with no improvment    HISTORY OF PRESENT ILLNESS: Sherry Prince  is a 69 y.o. female with a known history of stage IV breast carcinoma and small cell lung carcinoma, also diabetes. CK D, hypertension, obstructive sleep apnea, CHF, COPD who presents to the hospital was complains of generalized weakness, nausea, vomiting over the past 2 days and a few episodes of diarrheal stool. In emergency room, she was noted to be tachypneic, tachycardic with heart rate of 120-140. She was given IV fluids. There is no significant improvement yet. Denies any abdominal pain. Admits of having chills since yesterday. Admits of coughing up green sputum. Chest x-ray or abdominal x-rays were not done yet. Labs revealed pancytopenia. Hospitalist services contacted for admission. Afebrile  PAST MEDICAL HISTORY:   Past Medical History  Diagnosis Date  . Diabetes mellitus without complication ([place])   . Chronic kidney disease   . GERD (gastroesophageal reflux disease)   . Hypertension   . Ear pain   . Serum calcium elevated   . Floaters   . Sleep apnea   . Cancer ([place])   . Liver cancer ([place])   . Cervical cancer ([place])   . Bone cancer ([place])   . CHF (congestive heart failure) ([place])   . COPD (chronic obstructive pulmonary disease) ([place])     PAST SURGICAL HISTORY:  Past Surgical History  Procedure Laterality Date  . Abdominal hysterectomy    . Breast surgery    . Eye surgery    . Endobronchial ultrasound N/A 03/29/2015    Procedure: ENDOBRONCHIAL ULTRASOUND;   Surgeon: Flora Lipps, MD;  Location: [facility] ORS;  Service: Cardiopulmonary;  Laterality: N/A;    SOCIAL HISTORY:  Social History  Substance Use Topics  . Smoking status: Former Research scientist (life sciences)  . Smokeless tobacco: Not on file  . Alcohol Use: No    FAMILY HISTORY:  Family History  Problem Relation Age of Onset  . Cancer Father     DRUG ALLERGIES:  Allergies  Allergen Reactions  . Peanut Oil Anaphylaxis  . Penicillins Hives    Review of Systems  Constitutional: Positive for chills and malaise/fatigue. Negative for fever and weight loss.  HENT: Negative for congestion.   Eyes: Negative for blurred vision and double vision.  Respiratory: Positive for cough, sputum production, shortness of breath and wheezing.   Cardiovascular: Negative for chest pain, palpitations, orthopnea, leg swelling and PND.  Gastrointestinal: Positive for nausea, vomiting and diarrhea. Negative for abdominal pain, constipation, blood in stool and melena.  Genitourinary: Negative for dysuria, urgency, frequency and hematuria.  Musculoskeletal: Negative for falls.  Skin: Negative for rash.  Neurological: Positive for weakness. Negative for dizziness.  Psychiatric/Behavioral: Negative for depression and memory loss. The patient is not nervous/anxious.     MEDICATIONS AT HOME:  Prior to Admission medications   Medication Sig Start Date End Date Taking? Authorizing Provider  albuterol (PROVENTIL) (2.5 MG/3ML) 0.083% nebulizer solution Take 2.5 mg by nebulization every 2 (two) hours as needed for wheezing or shortness of breath.  Yes Historical Provider, MD  ARTIFICIAL TEARS 0.1-0.3 % SOLN Place 1 drop into both eyes as needed (dry eyes).  10/08/10  Yes Historical Provider, MD  atorvastatin (LIPITOR) 40 MG tablet Take 40 mg by mouth daily.   Yes Historical Provider, MD  budesonide-formoterol (SYMBICORT) 160-4.5 MCG/ACT inhaler Inhale 2 puffs into the lungs 2 (two) times daily.   Yes Historical Provider, MD  buPROPion  (WELLBUTRIN SR) 100 MG 12 hr tablet Take 100 mg by mouth daily.   Yes Historical Provider, MD  citalopram (CELEXA) 40 MG tablet Take 40 mg by mouth every morning.   Yes Historical Provider, MD  fluticasone (FLONASE) 50 MCG/ACT nasal spray Place 1 spray into the nose daily as needed for allergies or rhinitis.  12/18/11  Yes Historical Provider, MD  furosemide (LASIX) 40 MG tablet Take 1 tablet (40 mg total) by mouth daily. 03/27/15  Yes Gladstone Lighter, MD  gabapentin (NEURONTIN) 300 MG capsule Take 300 mg by mouth at bedtime.   Yes Historical Provider, MD  insulin detemir (LEVEMIR) 100 UNIT/ML injection Inject 35 Units into the skin at bedtime.   Yes Historical Provider, MD  isosorbide mononitrate (IMDUR) 30 MG 24 hr tablet Take 30 mg by mouth daily.   Yes Historical Provider, MD  lisinopril (PRINIVIL,ZESTRIL) 10 MG tablet Take 10 mg by mouth daily.   Yes Historical Provider, MD  loratadine (CLARITIN) 10 MG tablet Take 10 mg by mouth daily.   Yes Historical Provider, MD  LORazepam (ATIVAN) 1 MG tablet Take 1 mg by mouth every 8 (eight) hours as needed for anxiety.   Yes Historical Provider, MD  metFORMIN (GLUCOPHAGE) 1000 MG tablet Take 1,000 mg by mouth 2 (two) times daily with a meal.  03/20/15  Yes Historical Provider, MD  metoprolol tartrate (LOPRESSOR) 25 MG tablet Take 25 mg by mouth 2 (two) times daily.   Yes Historical Provider, MD  omeprazole (PRILOSEC) 20 MG capsule Take 20 mg by mouth 2 (two) times daily before a meal.   Yes Historical Provider, MD  promethazine (PHENERGAN) 25 MG tablet Take 25 mg by mouth every 6 (six) hours as needed for nausea or vomiting.   Yes Historical Provider, MD  tiotropium (SPIRIVA HANDIHALER) 18 MCG inhalation capsule Place 1 capsule into inhaler and inhale daily. 12/18/11  Yes Historical Provider, MD  traZODone (DESYREL) 50 MG tablet Take 1 tablet (50 mg total) by mouth at bedtime. 04/04/15  Yes Mitro Huger, MD  HYDROcodone-acetaminophen (NORCO/VICODIN)  5-325 MG tablet Take 1-2 tablets by mouth every 6 (six) hours as needed for moderate pain or severe pain. 03/27/15   Gladstone Lighter, MD  lidocaine-prilocaine (EMLA) cream Apply to affected area once 04/20/15   Manolis Huger, MD  prochlorperazine (COMPAZINE) 10 MG tablet Take 1 tablet (10 mg total) by mouth every 6 (six) hours as needed (Nausea or vomiting). 04/23/15   Delage Huger, MD      PHYSICAL EXAMINATION:   VITAL SIGNS: Blood pressure 118/87, pulse 132, temperature 98.7 F (37.1 C), temperature source Oral, resp. rate 22, height '5\' 5"'$  (1.651 m), weight 111.585 kg (246 lb), SpO2 99 %.  GENERAL:  69 y.o.-year-old obese and pale patient lying in the bed in the moderate distress due to feeling cold and chilly. She shaking visibly.  EYES: Pupils equal, round, reactive to light and accommodation. No scleral icterus. Extraocular muscles intact.   HEENT: Head atraumatic, normocephalic. Oropharynx and nasopharynx clear. Dry oral mucosa NECK:  Supple, no jugular venous distention. No  thyroid enlargement, no tenderness.  LUNGS: Some diminished breath sounds bilaterally, anterior wheezing, but no significant rales,rhonchi or crepitation noted on exam. No use of accessory muscles of respiration. Shivering CARDIOVASCULAR: S1, S2 , rhythm is regular, tachycardic, distant. No murmurs, rubs, or gallops.  ABDOMEN: Soft, nontender, mildly distended. Bowel sounds present. No organomegaly or mass.  EXTREMITIES: No pedal edema, cyanosis, or clubbing.  NEUROLOGIC: Cranial nerves II through XII are intact. Muscle strength 5/5 in all extremities. Sensation intact. Gait not checked.  PSYCHIATRIC: The patient is alert and oriented x 3.  SKIN: No obvious rash, lesion, or ulcer.   LABORATORY PANEL:   CBC  Recent Labs Lab 04/30/15 0909 05/02/15 0531  WBC 4.5 0.4*  HGB 11.6* 11.4*  HCT 35.7 34.8*  PLT 169 108*  MCV 87.0 86.8  MCH 28.1 28.3  MCHC 32.3 32.6  RDW 14.3 14.3  LYMPHSABS 0.7*  --    MONOABS 0.0*  --   EOSABS 0.0  --   BASOSABS 0.0  --    ------------------------------------------------------------------------------------------------------------------  Chemistries   Recent Labs Lab 05/02/15 0531  NA 135  K 3.9  CL 99*  CO2 24  GLUCOSE 279*  BUN 25*  CREATININE 1.14*  CALCIUM 7.4*  AST 21  ALT 25  ALKPHOS 183*  BILITOT 0.8   ------------------------------------------------------------------------------------------------------------------  Cardiac Enzymes  Recent Labs Lab 05/02/15 0531  TROPONINI <0.03   ------------------------------------------------------------------------------------------------------------------  RADIOLOGY: No results found.  EKG: Orders placed or performed during the hospital encounter of 05/02/15  . ED EKG  . ED EKG    IMPRESSION AND PLAN:  Principal Problem:   Nausea & vomiting Active Problems:   CKD (chronic kidney disease)   Pancytopenia ([place])   Community acquired pneumonia 1. Nausea and vomiting, likely due to chemotherapy, admit patient, medical floor. Symptomatic therapy, IV fluids, clear liquid diet, get KUB 2. Pancytopenia, followed with conservative therapy, get blood cultures, initiate levofloxacin intravenously 3. Suspected pneumonia versus bronchitis, chest x-ray is still pending, initiate patient on levofloxacin intravenously, getting sputum cultures 4. Sinus tachycardia, possibly related to dehydration. Continue patient on IV fluids and follow clinically, continue metoprolol 5. Chills. Questionable sepsis. Get blood cultures, initiate antibiotic therapy, follow clinically and lab wise,    All the records are reviewed and case discussed with ED provider. Management plans discussed with the patient, family and they are in agreement.  CODE STATUS:    TOTAL TIME TAKING CARE OF THIS PATIENT: 60 minutes.    Theodoro Grist M.D on 05/02/2015 at 9:57 AM  Between 7am to 6pm - Pager -  [phone] After 6pm go to [email or web address] - password [email or web address] [place] [facility]  Office  [phone]  CC: Primary care physician; Theotis Burrow, MD

## 2015-05-03 ENCOUNTER — Observation Stay: Payer: Medicare Other

## 2015-05-03 ENCOUNTER — Ambulatory Visit: Payer: Medicare Other

## 2015-05-03 DIAGNOSIS — Z87891 Personal history of nicotine dependence: Secondary | ICD-10-CM | POA: Diagnosis not present

## 2015-05-03 DIAGNOSIS — E872 Acidosis: Secondary | ICD-10-CM | POA: Diagnosis not present

## 2015-05-03 DIAGNOSIS — R05 Cough: Secondary | ICD-10-CM | POA: Diagnosis present

## 2015-05-03 DIAGNOSIS — D61810 Antineoplastic chemotherapy induced pancytopenia: Secondary | ICD-10-CM | POA: Diagnosis present

## 2015-05-03 DIAGNOSIS — C787 Secondary malignant neoplasm of liver and intrahepatic bile duct: Secondary | ICD-10-CM | POA: Diagnosis present

## 2015-05-03 DIAGNOSIS — I959 Hypotension, unspecified: Secondary | ICD-10-CM | POA: Diagnosis not present

## 2015-05-03 DIAGNOSIS — N189 Chronic kidney disease, unspecified: Secondary | ICD-10-CM | POA: Diagnosis present

## 2015-05-03 DIAGNOSIS — E876 Hypokalemia: Secondary | ICD-10-CM | POA: Diagnosis present

## 2015-05-03 DIAGNOSIS — I129 Hypertensive chronic kidney disease with stage 1 through stage 4 chronic kidney disease, or unspecified chronic kidney disease: Secondary | ICD-10-CM | POA: Diagnosis present

## 2015-05-03 DIAGNOSIS — Z923 Personal history of irradiation: Secondary | ICD-10-CM | POA: Diagnosis not present

## 2015-05-03 DIAGNOSIS — Z91018 Allergy to other foods: Secondary | ICD-10-CM | POA: Diagnosis not present

## 2015-05-03 DIAGNOSIS — G4733 Obstructive sleep apnea (adult) (pediatric): Secondary | ICD-10-CM | POA: Diagnosis present

## 2015-05-03 DIAGNOSIS — Z9221 Personal history of antineoplastic chemotherapy: Secondary | ICD-10-CM | POA: Diagnosis not present

## 2015-05-03 DIAGNOSIS — E669 Obesity, unspecified: Secondary | ICD-10-CM | POA: Diagnosis present

## 2015-05-03 DIAGNOSIS — J449 Chronic obstructive pulmonary disease, unspecified: Secondary | ICD-10-CM | POA: Diagnosis present

## 2015-05-03 DIAGNOSIS — Z7951 Long term (current) use of inhaled steroids: Secondary | ICD-10-CM | POA: Diagnosis not present

## 2015-05-03 DIAGNOSIS — Z8541 Personal history of malignant neoplasm of cervix uteri: Secondary | ICD-10-CM | POA: Diagnosis not present

## 2015-05-03 DIAGNOSIS — Z9981 Dependence on supplemental oxygen: Secondary | ICD-10-CM | POA: Diagnosis not present

## 2015-05-03 DIAGNOSIS — C3490 Malignant neoplasm of unspecified part of unspecified bronchus or lung: Secondary | ICD-10-CM | POA: Diagnosis not present

## 2015-05-03 DIAGNOSIS — N179 Acute kidney failure, unspecified: Secondary | ICD-10-CM | POA: Diagnosis present

## 2015-05-03 DIAGNOSIS — Z129 Encounter for screening for malignant neoplasm, site unspecified: Secondary | ICD-10-CM

## 2015-05-03 DIAGNOSIS — J9621 Acute and chronic respiratory failure with hypoxia: Secondary | ICD-10-CM | POA: Diagnosis not present

## 2015-05-03 DIAGNOSIS — T451X5A Adverse effect of antineoplastic and immunosuppressive drugs, initial encounter: Secondary | ICD-10-CM | POA: Diagnosis present

## 2015-05-03 DIAGNOSIS — Z7984 Long term (current) use of oral hypoglycemic drugs: Secondary | ICD-10-CM | POA: Diagnosis not present

## 2015-05-03 DIAGNOSIS — Z79899 Other long term (current) drug therapy: Secondary | ICD-10-CM | POA: Diagnosis not present

## 2015-05-03 DIAGNOSIS — K219 Gastro-esophageal reflux disease without esophagitis: Secondary | ICD-10-CM | POA: Diagnosis present

## 2015-05-03 DIAGNOSIS — T451X5S Adverse effect of antineoplastic and immunosuppressive drugs, sequela: Secondary | ICD-10-CM | POA: Diagnosis not present

## 2015-05-03 DIAGNOSIS — C3402 Malignant neoplasm of left main bronchus: Secondary | ICD-10-CM | POA: Diagnosis present

## 2015-05-03 DIAGNOSIS — Z6841 Body Mass Index (BMI) 40.0 and over, adult: Secondary | ICD-10-CM | POA: Diagnosis not present

## 2015-05-03 DIAGNOSIS — M5136 Other intervertebral disc degeneration, lumbar region: Secondary | ICD-10-CM | POA: Diagnosis present

## 2015-05-03 DIAGNOSIS — I509 Heart failure, unspecified: Secondary | ICD-10-CM | POA: Diagnosis present

## 2015-05-03 DIAGNOSIS — Z515 Encounter for palliative care: Secondary | ICD-10-CM | POA: Diagnosis not present

## 2015-05-03 DIAGNOSIS — E43 Unspecified severe protein-calorie malnutrition: Secondary | ICD-10-CM | POA: Insufficient documentation

## 2015-05-03 DIAGNOSIS — D619 Aplastic anemia, unspecified: Secondary | ICD-10-CM | POA: Diagnosis not present

## 2015-05-03 DIAGNOSIS — Z66 Do not resuscitate: Secondary | ICD-10-CM | POA: Diagnosis present

## 2015-05-03 DIAGNOSIS — F419 Anxiety disorder, unspecified: Secondary | ICD-10-CM | POA: Diagnosis present

## 2015-05-03 DIAGNOSIS — M179 Osteoarthritis of knee, unspecified: Secondary | ICD-10-CM | POA: Diagnosis present

## 2015-05-03 DIAGNOSIS — Z880 Allergy status to penicillin: Secondary | ICD-10-CM | POA: Diagnosis not present

## 2015-05-03 DIAGNOSIS — R5081 Fever presenting with conditions classified elsewhere: Secondary | ICD-10-CM | POA: Diagnosis present

## 2015-05-03 DIAGNOSIS — G9341 Metabolic encephalopathy: Secondary | ICD-10-CM | POA: Diagnosis not present

## 2015-05-03 DIAGNOSIS — D701 Agranulocytosis secondary to cancer chemotherapy: Secondary | ICD-10-CM | POA: Diagnosis present

## 2015-05-03 DIAGNOSIS — Z809 Family history of malignant neoplasm, unspecified: Secondary | ICD-10-CM | POA: Diagnosis not present

## 2015-05-03 DIAGNOSIS — E1122 Type 2 diabetes mellitus with diabetic chronic kidney disease: Secondary | ICD-10-CM | POA: Diagnosis present

## 2015-05-03 DIAGNOSIS — E860 Dehydration: Secondary | ICD-10-CM | POA: Diagnosis present

## 2015-05-03 DIAGNOSIS — Z853 Personal history of malignant neoplasm of breast: Secondary | ICD-10-CM | POA: Diagnosis not present

## 2015-05-03 DIAGNOSIS — F329 Major depressive disorder, single episode, unspecified: Secondary | ICD-10-CM | POA: Diagnosis present

## 2015-05-03 LAB — URINALYSIS COMPLETE WITH MICROSCOPIC (ARMC ONLY)
Bacteria, UA: NONE SEEN
Bilirubin Urine: NEGATIVE
Glucose, UA: >500 mg/dL — AB
LEUKOCYTES UA: NEGATIVE
Nitrite: NEGATIVE
PROTEIN: 100 mg/dL — AB
SPECIFIC GRAVITY, URINE: 1.026 (ref 1.005–1.030)
pH: 5 (ref 5.0–8.0)

## 2015-05-03 LAB — CBC
HEMATOCRIT: 31.5 % — AB (ref 35.0–47.0)
HEMOGLOBIN: 10.4 g/dL — AB (ref 12.0–16.0)
MCH: 28.6 pg (ref 26.0–34.0)
MCHC: 33 g/dL (ref 32.0–36.0)
MCV: 86.8 fL (ref 80.0–100.0)
Platelets: 58 10*3/uL — ABNORMAL LOW (ref 150–440)
RBC: 3.63 MIL/uL — ABNORMAL LOW (ref 3.80–5.20)
RDW: 14.1 % (ref 11.5–14.5)
WBC: 0.2 10*3/uL — CL (ref 3.6–11.0)

## 2015-05-03 LAB — C DIFFICILE QUICK SCREEN W PCR REFLEX
C Diff antigen: NEGATIVE
C Diff interpretation: NEGATIVE
C Diff toxin: NEGATIVE

## 2015-05-03 LAB — GLUCOSE, CAPILLARY
GLUCOSE-CAPILLARY: 225 mg/dL — AB (ref 65–99)
GLUCOSE-CAPILLARY: 93 mg/dL (ref 65–99)
GLUCOSE-CAPILLARY: 127 mg/dL — AB (ref 65–99)
Glucose-Capillary: 143 mg/dL — ABNORMAL HIGH (ref 65–99)

## 2015-05-03 LAB — BASIC METABOLIC PANEL
ANION GAP: 6 (ref 5–15)
BUN: 22 mg/dL — ABNORMAL HIGH (ref 6–20)
CO2: 24 mmol/L (ref 22–32)
Calcium: 6.1 mg/dL — CL (ref 8.9–10.3)
Chloride: 106 mmol/L (ref 101–111)
Creatinine, Ser: 1.27 mg/dL — ABNORMAL HIGH (ref 0.44–1.00)
GFR calc Af Amer: 49 mL/min — ABNORMAL LOW (ref >60)
GFR calc non Af Amer: 42 mL/min — ABNORMAL LOW (ref >60)
GLUCOSE: 198 mg/dL — AB (ref 65–99)
POTASSIUM: 3.1 mmol/L — AB (ref 3.5–5.1)
Sodium: 136 mmol/L (ref 135–145)

## 2015-05-03 MED ORDER — SODIUM CHLORIDE 0.9 % IV SOLN
2.0000 g | Freq: Once | INTRAVENOUS | Status: AC
  Administered 2015-05-03: 12:00:00 2 g via INTRAVENOUS
  Filled 2015-05-03: qty 20

## 2015-05-03 MED ORDER — BOOST / RESOURCE BREEZE PO LIQD
1.0000 | Freq: Three times a day (TID) | ORAL | Status: DC
  Administered 2015-05-03 – 2015-05-07 (×11): 1 via ORAL

## 2015-05-03 MED ORDER — FILGRASTIM 300 MCG/ML IJ SOLN
300.0000 ug | Freq: Every day | INTRAMUSCULAR | Status: DC
  Administered 2015-05-03 – 2015-05-07 (×5): 300 ug via SUBCUTANEOUS
  Filled 2015-05-03 (×5): qty 1

## 2015-05-03 MED ORDER — POTASSIUM CHLORIDE CRYS ER 20 MEQ PO TBCR
40.0000 meq | EXTENDED_RELEASE_TABLET | Freq: Two times a day (BID) | ORAL | Status: DC
Start: 2015-05-03 — End: 2015-05-04
  Administered 2015-05-03 – 2015-05-04 (×3): 40 meq via ORAL
  Filled 2015-05-03 (×3): qty 2

## 2015-05-03 MED ORDER — ONDANSETRON HCL 4 MG/2ML IJ SOLN
4.0000 mg | Freq: Four times a day (QID) | INTRAMUSCULAR | Status: DC | PRN
  Administered 2015-05-03 – 2015-05-06 (×5): 4 mg via INTRAVENOUS
  Filled 2015-05-03 (×6): qty 2

## 2015-05-03 MED ORDER — POTASSIUM CHLORIDE 10 MEQ/100ML IV SOLN
10.0000 meq | INTRAVENOUS | Status: AC
  Administered 2015-05-03 (×2): 10 meq via INTRAVENOUS
  Filled 2015-05-03 (×2): qty 100

## 2015-05-03 MED ORDER — CALCIUM CARBONATE ANTACID 500 MG PO CHEW
1.0000 | CHEWABLE_TABLET | Freq: Three times a day (TID) | ORAL | Status: DC
  Administered 2015-05-03 – 2015-05-06 (×9): 200 mg via ORAL
  Filled 2015-05-03 (×11): qty 1

## 2015-05-03 MED ORDER — IBUPROFEN 400 MG PO TABS
800.0000 mg | ORAL_TABLET | Freq: Once | ORAL | Status: AC
  Administered 2015-05-03: 800 mg via ORAL
  Filled 2015-05-03: qty 2

## 2015-05-03 NOTE — Consult Note (Signed)
[place]  [phone]) [phone] Fax:(336) [phone]  ID: ODEAL WELDEN OB: 03/27/1946  MR#: [id number]  CWC#:[id number]  Patient Care Team: Theotis Burrow, MD as PCP - General (Family Medicine)  CHIEF COMPLAINT:  Chief Complaint  Patient presents with  . Emesis    ems pt from home pt has history of Lung CA recent chemo and radiation has been vomiting since yesterday taking phenergan with no improvment    INTERVAL HISTORY: Patient is a 69 year old female with history of stage IV breast cancer who recently started chemotherapy with cisplatin and etoposide for stage IV small cell lung cancer. She was admitted to the hospital with increasing weakness and fatigue, nausea vomiting, and now neutropenic fever over the past 24-48 hours. She has no neurologic complaints. She denies any chest pain or shortness of breath. She has a poor appetite. She denies any pain today. Patient feels generally terrible, but offers no further specific complaints.  REVIEW OF SYSTEMS:   Review of Systems  Constitutional: Positive for fever and malaise/fatigue.  Respiratory: Negative.  Negative for shortness of breath.   Cardiovascular: Negative.  Negative for chest pain.  Gastrointestinal: Positive for nausea and vomiting.  Musculoskeletal: Negative.   Neurological: Positive for weakness.    As per HPI. Otherwise, a complete review of systems is negatve.  PAST MEDICAL HISTORY: Past Medical History  Diagnosis Date  . Diabetes mellitus without complication ([place])   . Chronic kidney disease   . GERD (gastroesophageal reflux disease)   . Hypertension   . Ear pain   . Serum calcium elevated   . Floaters   . Sleep apnea   . Cancer ([place])   . Liver cancer ([place])   . Cervical cancer ([place])   . Bone cancer ([place])   . CHF (congestive heart failure) ([place])   . COPD (chronic obstructive pulmonary disease) ([place])     PAST SURGICAL HISTORY: Past Surgical History  Procedure Laterality  Date  . Abdominal hysterectomy    . Breast surgery    . Eye surgery    . Endobronchial ultrasound N/A 03/29/2015    Procedure: ENDOBRONCHIAL ULTRASOUND;  Surgeon: Flora Lipps, MD;  Location: [facility] ORS;  Service: Cardiopulmonary;  Laterality: N/A;    FAMILY HISTORY Family History  Problem Relation Age of Onset  . Cancer Father        ADVANCED DIRECTIVES:    HEALTH MAINTENANCE: Social History  Substance Use Topics  . Smoking status: Former Research scientist (life sciences)  . Smokeless tobacco: None  . Alcohol Use: No     Colonoscopy:  PAP:  Bone density:  Lipid panel:  Allergies  Allergen Reactions  . Peanut Oil Anaphylaxis  . Penicillins Hives    Current Facility-Administered Medications  Medication Dose Route Frequency Provider Last Rate Last Dose  . 0.9 % NaCl with KCl 20 mEq/ L  infusion   Intravenous Continuous Theodoro Grist, MD 100 mL/hr at 05/03/15 0242    . acetaminophen (TYLENOL) tablet 650 mg  650 mg Oral Q6H PRN Theodoro Grist, MD   650 mg at 05/03/15 0318   Or  . acetaminophen (TYLENOL) suppository 650 mg  650 mg Rectal Q6H PRN Theodoro Grist, MD      . albuterol (PROVENTIL) (2.5 MG/3ML) 0.083% nebulizer solution 2.5 mg  2.5 mg Nebulization Q2H PRN Theodoro Grist, MD      . atorvastatin (LIPITOR) tablet 40 mg  40 mg Oral Daily Theodoro Grist, MD   40 mg at 05/02/15 1538  . benzonatate (TESSALON) capsule  200 mg  200 mg Oral TID PRN Theodoro Grist, MD      . budesonide-formoterol (SYMBICORT) 160-4.5 MCG/ACT inhaler 2 puff  2 puff Inhalation BID Theodoro Grist, MD   2 puff at 05/03/15 0853  . buPROPion (WELLBUTRIN SR) 12 hr tablet 100 mg  100 mg Oral Daily Theodoro Grist, MD   100 mg at 05/02/15 1538  . calcium carbonate (TUMS - dosed in mg elemental calcium) chewable tablet 200 mg of elemental calcium  1 tablet Oral TID WC Harrie Foreman, MD   200 mg of elemental calcium at 05/03/15 0902  . calcium gluconate 2 g in sodium chloride 0.9 % 100 mL IVPB  2 g Intravenous Once Vaughan Basta, MD   2 g at 05/03/15 1225  . cefTAZidime (FORTAZ) 2 g in dextrose 5 % 50 mL IVPB  2 g Intravenous 3 times per day Theodoro Grist, MD   2 g at 05/03/15 0603  . citalopram (CELEXA) tablet 40 mg  40 mg Oral q morning - 10a Theodoro Grist, MD   40 mg at 05/03/15 1222  . enoxaparin (LOVENOX) injection 40 mg  40 mg Subcutaneous Q12H Theodoro Grist, MD   40 mg at 05/03/15 1205  . feeding supplement (BOOST / RESOURCE BREEZE) liquid 1 Container  1 Container Oral TID WC Vaughan Basta, MD   1 Container at 05/03/15 1200  . fluticasone (FLONASE) 50 MCG/ACT nasal spray 1 spray  1 spray Each Nare Daily PRN Theodoro Grist, MD      . gabapentin (NEURONTIN) capsule 300 mg  300 mg Oral QHS Theodoro Grist, MD   300 mg at 05/02/15 2227  . HYDROcodone-acetaminophen (NORCO/VICODIN) 5-325 MG per tablet 1-2 tablet  1-2 tablet Oral Q6H PRN Theodoro Grist, MD      . insulin aspart (novoLOG) injection 0-9 Units  0-9 Units Subcutaneous TID WC Theodoro Grist, MD   1 Units at 05/03/15 0856  . insulin aspart (novoLOG) injection 3 Units  3 Units Subcutaneous TID WC Theodoro Grist, MD   3 Units at 05/03/15 0855  . insulin detemir (LEVEMIR) injection 35 Units  35 Units Subcutaneous QHS Theodoro Grist, MD   35 Units at 05/02/15 2227  . loratadine (CLARITIN) tablet 10 mg  10 mg Oral Daily Theodoro Grist, MD   10 mg at 05/02/15 1538  . LORazepam (ATIVAN) tablet 1 mg  1 mg Oral Q8H PRN Theodoro Grist, MD      . metFORMIN (GLUCOPHAGE) tablet 1,000 mg  1,000 mg Oral BID WC Theodoro Grist, MD   1,000 mg at 05/02/15 1537  . morphine 2 MG/ML injection 2 mg  2 mg Intravenous Q3H PRN Theodoro Grist, MD      . ondansetron (ZOFRAN) injection 4 mg  4 mg Intravenous Q6H PRN Vaughan Basta, MD   4 mg at 05/03/15 1159  . pantoprazole (PROTONIX) EC tablet 40 mg  40 mg Oral Daily Theodoro Grist, MD   40 mg at 05/02/15 1537  . polyvinyl alcohol (LIQUIFILM TEARS) 1.4 % ophthalmic solution 1 drop  1 drop Both Eyes PRN Theodoro Grist, MD      .  potassium chloride SA (K-DUR,KLOR-CON) CR tablet 40 mEq  40 mEq Oral BID Harrie Foreman, MD   40 mEq at 05/03/15 0903  . prochlorperazine (COMPAZINE) tablet 10 mg  10 mg Oral Q6H PRN Theodoro Grist, MD      . promethazine (PHENERGAN) tablet 25 mg  25 mg Oral Q6H PRN Theodoro Grist, MD   25  mg at 05/03/15 0849  . sodium chloride 0.9 % injection 3 mL  3 mL Intravenous Q12H Theodoro Grist, MD   3 mL at 05/03/15 1155  . tiotropium (SPIRIVA) inhalation capsule 18 mcg  1 capsule Inhalation Daily Theodoro Grist, MD   18 mcg at 05/03/15 1225  . traZODone (DESYREL) tablet 50 mg  50 mg Oral QHS Theodoro Grist, MD   50 mg at 05/02/15 2227    OBJECTIVE: Filed Vitals:   05/03/15 0916  BP:   Pulse:   Temp: 99.2 F (37.3 C)  Resp:      Body mass index is 40.94 kg/(m^2).    ECOG FS:2 - Symptomatic, <50% confined to bed  General: Ill-appearing, no acute distress. Eyes: Pink conjunctiva, anicteric sclera. HEENT: Normocephalic, moist mucous membranes, clear oropharnyx. Lungs: Clear to auscultation bilaterally. Heart: Regular rate and rhythm. No rubs, murmurs, or gallops. Abdomen: Soft, nontender, nondistended. No organomegaly noted, normoactive bowel sounds. Musculoskeletal: No edema, cyanosis, or clubbing. Neuro: Alert, answering all questions appropriately. Cranial nerves grossly intact. Skin: No rashes or petechiae noted. Psych: Normal affect. Lymphatics: No cervical, calvicular, axillary or inguinal LAD.   LAB RESULTS:  Lab Results  Component Value Date   NA 136 05/03/2015   K 3.1* 05/03/2015   CL 106 05/03/2015   CO2 24 05/03/2015   GLUCOSE 198* 05/03/2015   BUN 22* 05/03/2015   CREATININE 1.27* 05/03/2015   CALCIUM 6.1* 05/03/2015   PROT 6.6 05/02/2015   ALBUMIN 3.1* 05/02/2015   AST 21 05/02/2015   ALT 25 05/02/2015   ALKPHOS 183* 05/02/2015   BILITOT 0.8 05/02/2015   GFRNONAA 42* 05/03/2015   GFRAA 49* 05/03/2015    Lab Results  Component Value Date   WBC 0.2* 05/03/2015    NEUTROABS 3.7 04/30/2015   HGB 10.4* 05/03/2015   HCT 31.5* 05/03/2015   MCV 86.8 05/03/2015   PLT 58* 05/03/2015     STUDIES: Dg Abd 1 View  05/02/2015  CLINICAL DATA:  Vomiting and diarrhea. Weakness. Cough. Currently undergoing chemotherapy for metastatic lung cancer. EXAM: ABDOMEN - 1 VIEW COMPARISON:  Radiographs dated 01/17/2007 and PET-CT dated 04/10/2015 FINDINGS: The bowel gas pattern is normal. No visible free air or free fluid. No acute osseous abnormalities. IMPRESSION: Benign-appearing abdomen. Electronically Signed   By: Lorriane Shire M.D.   On: 05/02/2015 10:58   Mr Jeri Cos ZO Contrast  04/11/2015  CLINICAL DATA:  Initial staging of recently diagnosed left-sided small-cell lung cancer. EXAM: MRI HEAD WITHOUT AND WITH CONTRAST TECHNIQUE: Multiplanar, multiecho pulse sequences of the brain and surrounding structures were obtained without and with intravenous contrast. CONTRAST:  70m MULTIHANCE GADOBENATE DIMEGLUMINE 529 MG/ML IV SOLN COMPARISON:  None. FINDINGS: There is no evidence of acute infarct, intracranial hemorrhage, mass, midline shift, or extra-axial fluid collection. There is moderate generalized cerebral atrophy. Small foci of T2 hyperintensity in the subcortical and deep cerebral white matter and pons are nonspecific but compatible with mild chronic small vessel ischemic disease. An incidental, small left parietal developmental venous anomaly is noted. No abnormal enhancement is identified elsewhere. Prior bilateral cataract extraction is noted. There is at most trace mastoid air cell fluid bilaterally. No significant inflammatory disease is seen in the paranasal sinuses. Major intracranial vascular flow voids are preserved. No suspicious skull lesions are identified. IMPRESSION: 1. No evidence of intracranial metastases. 2. Mild chronic small vessel ischemic disease and moderate cerebral atrophy. Electronically Signed   By: ALogan BoresM.D.   On: 04/11/2015 10:42  Nm  Pet Image Initial (pi) Skull Base To Thigh  04/10/2015  CLINICAL DATA:  Initial treatment strategy for small cell lung cancer. EXAM: NUCLEAR MEDICINE PET SKULL BASE TO THIGH TECHNIQUE: 11.4 mCi F-18 FDG was injected intravenously. Full-ring PET imaging was performed from the skull base to thigh after the radiotracer. CT data was obtained and used for attenuation correction and anatomic localization. FASTING BLOOD GLUCOSE:  Value: 124 mg/dl COMPARISON:  CT 03/24/2015 FINDINGS: NECK LEFT level 3 lymph node measures 11 mm adjacent to the thyroid cartilage with SUV max equal 8.2 (image 40 fused data set). CHEST Hypermetabolic nodule measuring 2.8 cm (image 86, series 4) in the LEFT upper lobe with SUV max 15.5. There is a nodule more superiorly measuring 13 mm without associated metabolic activity which likely represents small focus of the post biopsy hemorrhage. No pleural fluid in the LEFT hemi thorax. There are hypermetabolic mediastinal lymph nodes including a subcarinal lymph node measuring 23 mm short axis with SUV max 12.2. Contralateral hypermetabolic RIGHT paratracheal lymph node on image 71 of the fused data set. There is hypermetabolic mass in the LEFT hilum measuring 3.3 cm. ABDOMEN/PELVIS There are multiple foci of hypermetabolic activity within the LEFT and RIGHT hepatic lobe. These correspond to low-density lesions on comparison CT consistent widespread hepatic metastasis. Individual lesions have intense metabolic activity with SUV max equal 12.0. There are approximately 15 lesions within each hepatic lobe. No abnormal metabolic activity adrenal glands. Spleen is normal. No hypermetabolic abdominal or pelvic lymph nodes. SKELETON No focal hypermetabolic activity to suggest skeletal metastasis. IMPRESSION: 1. Hypermetabolic mass in LEFT upper lobe consists with primary bronchogenic carcinoma. 2. Extensive metastatic hypermetabolic adenopathy including LEFT hilum, subcarinal, contralateral paratracheal,  and LEFT level III neck lymph node. 3. Multiple foci of hypermetabolic HEPATIC METASTASIS. 4. Small focus of post biopsy hemorrhage in the LEFT upper lobe. Electronically Signed   By: Suzy Bouchard M.D.   On: 04/10/2015 12:30   Dg Chest Port 1 View  05/03/2015  CLINICAL DATA:  Cough. EXAM: PORTABLE CHEST 1 VIEW COMPARISON:  03/23/2015 FINDINGS: There is a right jugular Port-A-Cath with tip at the cavoatrial junction. The left upper lobe mass is faintly visible. New no confluent airspace consolidation. No large effusion. Pulmonary vasculature is normal. IMPRESSION: Faint visibility of the left upper lobe mass. No acute findings are evident Electronically Signed   By: Andreas Newport M.D.   On: 05/03/2015 06:31    ASSESSMENT: Stage IV small cell carcinoma lung, neutropenic fever. Intractable nausea and vomiting.  PLAN:    1. Lung cancer: Patient received cycle 1 of cisplatin and etoposide last week. Her next treatment is in approximately 2 weeks, but will consider delaying if acute symptoms do not resolve. 2. Pancytopenia: Secondary to chemotherapy. Continue to monitor and transfuse if hemoglobin falls below 7.0. Will initiate daily Neupogen until her [place] is greater than 1000. 3. Neutropenic fever: Neupogen as above. Blood cultures negative to date. Agree with current antibiotics. 4. Intractable nausea and vomiting: Secondary to recent chemotherapy. Continue current antiemetics and IV fluids as ordered.  Appreciate consult, will follow.  Pratt Huger, MD   05/03/2015 12:59 PM

## 2015-05-03 NOTE — Progress Notes (Signed)
Initial Nutrition Assessment  DOCUMENTATION CODES:   Severe malnutrition in context of acute illness/injury  INTERVENTION:   Coordination of Care: await diet progression as medically able Medical Food Supplement Therapy: will recommend Boost Breeze po TID, each supplement provides 250 kcal and 9 grams of protein. Will monitor blood glucose and intake of Boost Breeze. Once diet order advanced pt would benefit from switching to Glucerna shake instead.   NUTRITION DIAGNOSIS:   Malnutrition related to cancer and cancer related treatments as evidenced by energy intake < or equal to 50% for > or equal to 5 days, percent weight loss.  GOAL:   Patient will meet greater than or equal to 90% of their needs  MONITOR:    (Energy Intake, Electrolyte and renal Profile, Digestive System, Glucose Profile, Anthropometrics)  REASON FOR ASSESSMENT:   Consult Poor PO  ASSESSMENT:   Pt admitted with weakness, intractible n/v and neutropenic fevers secondary to chemotherapy. Pt with h/o stage IV breast cancer and small cell lung cancer.  Past Medical History  Diagnosis Date  . Diabetes mellitus without complication ([place])   . Chronic kidney disease   . GERD (gastroesophageal reflux disease)   . Hypertension   . Ear pain   . Serum calcium elevated   . Floaters   . Sleep apnea   . Cancer ([place])   . Liver cancer ([place])   . Cervical cancer ([place])   . Bone cancer ([place])   . CHF (congestive heart failure) ([place])   . COPD (chronic obstructive pulmonary disease) ([place])     Diet Order:  Diet clear liquid Room service appropriate?: Yes; Fluid consistency:: Thin    Current Nutrition: Pt reports trying to eat some popsicle this am, water at bedside. RD notes per Nsg documentation pt with emesis after grape popsicle.   Food/Nutrition-Related History: Pt reports very poor po intake for the past week, not hardly eating anything secondary to n/v. Chemotherapy initiated last week per chart review. Pt  reports wanting to try Ensure/Glucerna and was thinking of getting it PTA.   Scheduled Medications:  . atorvastatin  40 mg Oral Daily  . budesonide-formoterol  2 puff Inhalation BID  . buPROPion  100 mg Oral Daily  . calcium carbonate  1 tablet Oral TID WC  . cefTAZidime (FORTAZ)  IV  2 g Intravenous 3 times per day  . citalopram  40 mg Oral q morning - 10a  . enoxaparin (LOVENOX) injection  40 mg Subcutaneous Q12H  . feeding supplement  1 Container Oral TID WC  . filgrastim  300 mcg Subcutaneous Daily  . gabapentin  300 mg Oral QHS  . insulin aspart  0-9 Units Subcutaneous TID WC  . insulin aspart  3 Units Subcutaneous TID WC  . insulin detemir  35 Units Subcutaneous QHS  . loratadine  10 mg Oral Daily  . metFORMIN  1,000 mg Oral BID WC  . pantoprazole  40 mg Oral Daily  . potassium chloride  40 mEq Oral BID  . sodium chloride  3 mL Intravenous Q12H  . tiotropium  1 capsule Inhalation Daily  . traZODone  50 mg Oral QHS    Continuous Medications:  . 0.9 % NaCl with KCl 20 mEq / L 100 mL/hr at 05/03/15 0242     Electrolyte/Renal Profile and Glucose Profile:   Recent Labs Lab 04/30/15 0909 05/02/15 0531 05/03/15 0512  NA 136 135 136  K 4.2 3.9 3.1*  CL 99* 99* 106  CO2 28 24 24  BUN 32* 25* 22*  CREATININE 1.09* 1.14* 1.27*  CALCIUM 7.0* 7.4* 6.1*  GLUCOSE 195* 279* 198*   Protein Profile:  Recent Labs Lab 04/30/15 0909 05/02/15 0531  ALBUMIN 3.1* 3.1*   Lab Results  Component Value Date   HGBA1C 9.6* 05/02/2015   Nutritional Anemia Profile:  CBC Latest Ref Rng 05/03/2015 05/02/2015 04/30/2015  WBC 3.6 - 11.0 K/uL 0.2(LL) 0.4(LL) 4.5  Hemoglobin 12.0 - 16.0 g/dL 10.4(L) 11.4(L) 11.6(L)  Hematocrit 35.0 - 47.0 % 31.5(L) 34.8(L) 35.7  Platelets 150 - 440 K/uL 58(L) 108(L) 169    Gastrointestinal Profile: Last BM:  05/02/2015   Nutrition-Focused Physical Exam Findings: Nutrition-Focused physical exam completed. Findings are WDL for fat depletion, muscle  depletion, and edema.    Weight Change: Pt reports weight of 260lbs last week. Per [facility] pt with 4% weight loss in 1.5 weeks. And notably 5-6% in 5 weeks.    Skin:  Reviewed, no issues  Last BM:  05/02/2015  Height:   Ht Readings from Last 1 Encounters:  05/02/15 '5\' 5"'$  (1.651 m)    Weight:   Wt Readings from Last 1 Encounters:  05/02/15 246 lb (111.585 kg)    Wt Readings from Last 10 Encounters:  05/02/15 246 lb (111.585 kg)  05/01/15 246 lb (111.585 kg)  04/30/15 247 lb 5.7 oz (112.2 kg)  04/23/15 255 lb 15.3 oz (116.1 kg)  04/12/15 258 lb 6.1 oz (117.2 kg)  04/12/15 257 lb 6.2 oz (116.75 kg)  04/04/15 253 lb 15.5 oz (115.2 kg)  04/02/15 260 lb (117.935 kg)  03/27/15 262 lb (118.842 kg)  03/20/15 260 lb (117.935 kg)     Ideal Body Weight:   56.8kg  BMI:  Body mass index is 40.94 kg/(m^2).  Estimated Nutritional Needs:   Kcal:  using IBW of 56.8kg, BEE: 1093kcals, TEE: (IF 1.1-1.3)(AF 1.2) 1444-1706kcals  Protein:  62-74g protein (1.1-1.3g/kg)   Fluid:  1420-1761m of fluid (25-318mkg)  EDUCATION NEEDS:   No education needs identified at this time  HIFallstonRD, LDN Pager ([phone]

## 2015-05-03 NOTE — Progress Notes (Signed)
[place] [facility] [place] NAME: Sherry Prince    MR#:  [id number]  DATE OF BIRTH:  1946/01/11  SUBJECTIVE:  CHIEF COMPLAINT:   Chief Complaint  Patient presents with  . Emesis    ems pt from home pt has history of Lung CA recent chemo and radiation has been vomiting since yesterday taking phenergan with no improvment    have some vomiting today morning with meds and breakfast.  REVIEW OF SYSTEMS:  CONSTITUTIONAL: No fever, positive for fatigue or weakness.  EYES: No blurred or double vision.  EARS, NOSE, AND THROAT: No tinnitus or ear pain.  RESPIRATORY: No cough, shortness of breath, wheezing or hemoptysis.  CARDIOVASCULAR: No chest pain, orthopnea, edema.  GASTROINTESTINAL: positive for nausea, vomiting,  No diarrhea or abdominal pain.  GENITOURINARY: No dysuria, hematuria.  ENDOCRINE: No polyuria, nocturia,  HEMATOLOGY: No anemia, easy bruising or bleeding SKIN: No rash or lesion. MUSCULOSKELETAL: No joint pain or arthritis.   NEUROLOGIC: No tingling, numbness, weakness.  PSYCHIATRY: No anxiety or depression.   ROS  DRUG ALLERGIES:   Allergies  Allergen Reactions  . Peanut Oil Anaphylaxis  . Penicillins Hives    VITALS:  Blood pressure 106/90, pulse 144, temperature 101.4 F (38.6 C), temperature source Oral, resp. rate 22, height '5\' 5"'$  (1.651 m), weight 111.585 kg (246 lb), SpO2 82 %.  PHYSICAL EXAMINATION:  GENERAL:  69 y.o.-year-old patient lying in the bed with no acute distress.  EYES: Pupils equal, round, reactive to light and accommodation. No scleral icterus. Extraocular muscles intact.  HEENT: Head atraumatic, normocephalic. Oropharynx and nasopharynx clear.  NECK:  Supple, no jugular venous distention. No thyroid enlargement, no tenderness.  LUNGS: Normal breath sounds bilaterally, no wheezing, rales,rhonchi or crepitation. No use of accessory muscles of respiration.  CARDIOVASCULAR: S1, S2 normal. No murmurs,  rubs, or gallops.  ABDOMEN: Soft, nontender, nondistended. Bowel sounds present. No organomegaly or mass.  EXTREMITIES: No pedal edema, cyanosis, or clubbing.  NEUROLOGIC: Cranial nerves II through XII are intact. Muscle strength 5/5 in all extremities. Sensation intact. Gait not checked.  PSYCHIATRIC: The patient is alert and oriented x 3.  SKIN: No obvious rash, lesion, or ulcer.   Physical Exam LABORATORY PANEL:   CBC  Recent Labs Lab 05/03/15 0512  WBC 0.2*  HGB 10.4*  HCT 31.5*  PLT 58*   ------------------------------------------------------------------------------------------------------------------  Chemistries   Recent Labs Lab 05/02/15 0531 05/03/15 0512  NA 135 136  K 3.9 3.1*  CL 99* 106  CO2 24 24  GLUCOSE 279* 198*  BUN 25* 22*  CREATININE 1.14* 1.27*  CALCIUM 7.4* 6.1*  AST 21  --   ALT 25  --   ALKPHOS 183*  --   BILITOT 0.8  --    ------------------------------------------------------------------------------------------------------------------  Cardiac Enzymes  Recent Labs Lab 05/02/15 0531  TROPONINI <0.03   ------------------------------------------------------------------------------------------------------------------  RADIOLOGY:  Dg Abd 1 View  05/02/2015  CLINICAL DATA:  Vomiting and diarrhea. Weakness. Cough. Currently undergoing chemotherapy for metastatic lung cancer. EXAM: ABDOMEN - 1 VIEW COMPARISON:  Radiographs dated 01/17/2007 and PET-CT dated 04/10/2015 FINDINGS: The bowel gas pattern is normal. No visible free air or free fluid. No acute osseous abnormalities. IMPRESSION: Benign-appearing abdomen. Electronically Signed   By: Lorriane Shire M.D.   On: 05/02/2015 10:58   Dg Chest Port 1 View  05/03/2015  CLINICAL DATA:  Cough. EXAM: PORTABLE CHEST 1 VIEW COMPARISON:  03/23/2015 FINDINGS: There is a right jugular Port-A-Cath with tip  at the cavoatrial junction. The left upper lobe mass is faintly visible. New no confluent airspace  consolidation. No large effusion. Pulmonary vasculature is normal. IMPRESSION: Faint visibility of the left upper lobe mass. No acute findings are evident Electronically Signed   By: Andreas Newport M.D.   On: 05/03/2015 06:31    ASSESSMENT AND PLAN:   Principal Problem:   Nausea & vomiting Active Problems:   CKD (chronic kidney disease)   Pancytopenia ([place])   Community acquired pneumonia   Hypocalcemia   Protein-calorie malnutrition, severe  1. Nausea and vomiting, likely due to chemotherapy,   Symptomatic therapy, IV fluids, clear liquid diet, negative KUB 2. Pancytopenia, followed with conservative therapy, get blood cultures, ceftazidime intravenously    Had last chemo 1 week ago, appreciated oncology consult. 3. Suspected pneumonia versus bronchitis, chest x-ray is still pending, on ceftazidime intravenously, getting sputum cultures, no source of infection yet. 4. Sinus tachycardia, possibly related to dehydration. Continue patient on IV fluids and follow clinically, continue metoprolol 5. Chills. Questionable sepsis.  blood cultures, initiate antibiotic therapy, follow clinically and lab wise,    All the records are reviewed and case discussed with Care Management/Social Workerr. Management plans discussed with the patient, family and they are in agreement.  CODE STATUS: Full  TOTAL TIME TAKING CARE OF THIS PATIENT: 50 minutes.     POSSIBLE D/C IN 1-2 DAYS, DEPENDING ON CLINICAL CONDITION.   Vaughan Basta M.D on 05/03/2015   Between 7am to 6pm - Pager - [phone]  After 6pm go to [email or web address] - password [email or web address] [facility]  Office  [phone]  CC: Primary care physician; Theotis Burrow, MD  Note: This dictation was prepared with Dragon dictation along with smaller phrase technology. Any transcriptional errors that result from this process are unintentional.

## 2015-05-03 NOTE — Progress Notes (Signed)
Stool sent for c diff and came back neg.  Some confusion and disorientation at times. Assisted  To bsc  With 1 assist. Nausea  Better able to tol meds better po.temp earlier.  Tylenol  Given.  Bladder scan  This pm and 103 results.  Slept  This pm.

## 2015-05-03 NOTE — Plan of Care (Signed)
Problem: Education: Goal: Knowledge of  General Education information/materials will improve Outcome: Progressing Pt has not c/o pain, pt had a slight fever at 100.2, pt had chills. Tylenol given with MD approval with improvement  Problem: Safety: Goal: Ability to remain free from injury will improve Outcome: Progressing Pt is a high fall risk, educated. Calls when needs assistance.  Problem: Fluid Volume: Goal: Ability to maintain a balanced intake and output will improve Outcome: Progressing Pt is receiving fluids with potassium at 100 ml/hr, tolerating well.

## 2015-05-04 ENCOUNTER — Ambulatory Visit: Payer: Medicare Other

## 2015-05-04 ENCOUNTER — Inpatient Hospital Stay: Payer: Medicare Other

## 2015-05-04 LAB — BASIC METABOLIC PANEL
ANION GAP: 8 (ref 5–15)
BUN: 36 mg/dL — AB (ref 6–20)
CALCIUM: 6.1 mg/dL — AB (ref 8.9–10.3)
CO2: 18 mmol/L — ABNORMAL LOW (ref 22–32)
CREATININE: 2.84 mg/dL — AB (ref 0.44–1.00)
Chloride: 110 mmol/L (ref 101–111)
GFR calc Af Amer: 18 mL/min — ABNORMAL LOW (ref >60)
GFR, EST NON AFRICAN AMERICAN: 16 mL/min — AB (ref >60)
GLUCOSE: 154 mg/dL — AB (ref 65–99)
Potassium: 3.7 mmol/L (ref 3.5–5.1)
Sodium: 136 mmol/L (ref 135–145)

## 2015-05-04 LAB — CBC
HCT: 28.8 % — ABNORMAL LOW (ref 35.0–47.0)
HEMOGLOBIN: 9.6 g/dL — AB (ref 12.0–16.0)
MCH: 28.9 pg (ref 26.0–34.0)
MCHC: 33.4 g/dL (ref 32.0–36.0)
MCV: 86.5 fL (ref 80.0–100.0)
PLATELETS: 41 10*3/uL — AB (ref 150–440)
RBC: 3.33 MIL/uL — AB (ref 3.80–5.20)
RDW: 14.3 % (ref 11.5–14.5)
WBC: 0.2 10*3/uL — AB (ref 3.6–11.0)

## 2015-05-04 LAB — GLUCOSE, CAPILLARY
GLUCOSE-CAPILLARY: 160 mg/dL — AB (ref 65–99)
GLUCOSE-CAPILLARY: 83 mg/dL (ref 65–99)
Glucose-Capillary: 140 mg/dL — ABNORMAL HIGH (ref 65–99)
Glucose-Capillary: 102 mg/dL — ABNORMAL HIGH (ref 65–99)

## 2015-05-04 LAB — MAGNESIUM: Magnesium: 0.6 mg/dL — CL (ref 1.7–2.4)

## 2015-05-04 MED ORDER — DILTIAZEM HCL 30 MG PO TABS
30.0000 mg | ORAL_TABLET | Freq: Four times a day (QID) | ORAL | Status: DC
  Administered 2015-05-04 – 2015-05-08 (×14): 30 mg via ORAL
  Filled 2015-05-04 (×15): qty 1

## 2015-05-04 MED ORDER — METOPROLOL TARTRATE 25 MG PO TABS
12.5000 mg | ORAL_TABLET | Freq: Two times a day (BID) | ORAL | Status: DC
  Administered 2015-05-04 – 2015-05-07 (×8): 12.5 mg via ORAL
  Filled 2015-05-04 (×8): qty 1

## 2015-05-04 MED ORDER — MAGNESIUM SULFATE 2 GM/50ML IV SOLN
2.0000 g | Freq: Once | INTRAVENOUS | Status: AC
  Administered 2015-05-04: 2 g via INTRAVENOUS
  Filled 2015-05-04: qty 50

## 2015-05-04 MED ORDER — ENOXAPARIN SODIUM 40 MG/0.4ML ~~LOC~~ SOLN
40.0000 mg | SUBCUTANEOUS | Status: DC
  Administered 2015-05-05 – 2015-05-08 (×4): 40 mg via SUBCUTANEOUS
  Filled 2015-05-04 (×4): qty 0.4

## 2015-05-04 MED ORDER — CALCIUM GLUCONATE 10 % IV SOLN
2.0000 g | Freq: Once | INTRAVENOUS | Status: AC
  Administered 2015-05-04: 12:00:00 2 g via INTRAVENOUS
  Filled 2015-05-04: qty 20

## 2015-05-04 MED ORDER — DEXTROSE 5 % IV SOLN
2.0000 g | INTRAVENOUS | Status: DC
  Administered 2015-05-05 – 2015-05-07 (×3): 2 g via INTRAVENOUS
  Filled 2015-05-04 (×3): qty 2

## 2015-05-04 NOTE — Plan of Care (Signed)
Problem: Fluid Volume: Goal: Ability to maintain a balanced intake and output will improve Outcome: Progressing Plan of care, progress to goals. 1. Fever, tachycardia, rigors and pain.             A. Order received for Ibuprophen to treat temp 100.7             B. HR decreased with pain control and temp reduction, remains above 100, Dr Lavetta Nielsen notified.             C. Rigors improved with decrease in temp. 2. Diarrhea x1, tolerating po. Dorna Bloom RN

## 2015-05-04 NOTE — Progress Notes (Signed)
Pt's heart rate on tele elevated to 140's-150's, Bp is also elevated 184/97. Dr Anselm Jungling was made aware by phone.

## 2015-05-04 NOTE — Progress Notes (Signed)
ANTIBIOTIC CONSULT NOTE - INITIAL  Pharmacy Consult for Ceftazadime/Renal adjustment Indication: Febrile neutropenia  Allergies  Allergen Reactions  . Peanut Oil Anaphylaxis  . Penicillins Hives    Patient Measurements: Height: '5\' 5"'$  (165.1 cm) Weight: 246 lb (111.585 kg) IBW/kg (Calculated) : 57 Adjusted Body Weight: 79 kg  Vital Signs: Temp: 98.5 F (36.9 C) (11/04 0816) Temp Source: Oral (11/04 0816) BP: 144/53 mmHg (11/04 0816) Pulse Rate: 135 (11/04 1009) Intake/Output from previous day: 11/03 0701 - 11/04 0700 In: 3 [I.V.:3] Out: 50 [Emesis/NG output:50] Intake/Output from this shift: Total I/O In: 120 [P.O.:120] Out: -   Labs:  Recent Labs  05/02/15 0531 05/03/15 0512 05/04/15 0930  WBC 0.4* 0.2* 0.2*  HGB 11.4* 10.4* 9.6*  PLT 108* 58* 41*  CREATININE 1.14* 1.27* 2.84*   Estimated Creatinine Clearance: 23.3 mL/min (by C-G formula based on Cr of 2.84). No results for input(s): VANCOTROUGH, VANCOPEAK, VANCORANDOM, GENTTROUGH, GENTPEAK, GENTRANDOM, TOBRATROUGH, TOBRAPEAK, TOBRARND, AMIKACINPEAK, AMIKACINTROU, AMIKACIN in the last 72 hours.   Microbiology: Recent Results (from the past 720 hour(s))  Culture, blood (routine x 2)     Status: None (Preliminary result)   Collection Time: 05/02/15 10:45 AM  Result Value Ref Range Status   Specimen Description BLOOD RIGHT HAND  Final   Special Requests BOTTLES DRAWN AEROBIC AND ANAEROBIC 3CC  Final   Culture NO GROWTH 2 DAYS  Final   Report Status PENDING  Incomplete  Culture, blood (routine x 2)     Status: None (Preliminary result)   Collection Time: 05/02/15 11:03 AM  Result Value Ref Range Status   Specimen Description BLOOD RIGHT HAND  Final   Special Requests BOTTLES DRAWN AEROBIC AND ANAEROBIC 4CC  Final   Culture NO GROWTH 2 DAYS  Final   Report Status PENDING  Incomplete  Culture, blood (routine x 2)     Status: None (Preliminary result)   Collection Time: 05/03/15  4:47 AM  Result Value Ref  Range Status   Specimen Description BLOOD LEFT ANTECUBITAL  Final   Special Requests BOTTLES DRAWN AEROBIC AND ANAEROBIC 10ML  Final   Culture NO GROWTH 1 DAY  Final   Report Status PENDING  Incomplete  Culture, blood (routine x 2)     Status: None (Preliminary result)   Collection Time: 05/03/15  5:12 AM  Result Value Ref Range Status   Specimen Description BLOOD LEFT HAND  Final   Special Requests BOTTLES DRAWN AEROBIC AND ANAEROBIC 10ML  Final   Culture NO GROWTH 1 DAY  Final   Report Status PENDING  Incomplete  Stool culture     Status: None (Preliminary result)   Collection Time: 05/03/15  4:05 PM  Result Value Ref Range Status   Specimen Description STOOL  Final   Special Requests Immunocompromised  Final   Culture TOO YOUNG TO READ  Final   Report Status PENDING  Incomplete  C difficile quick scan w PCR reflex     Status: None   Collection Time: 05/03/15  4:05 PM  Result Value Ref Range Status   C Diff antigen NEGATIVE NEGATIVE Final   C Diff toxin NEGATIVE NEGATIVE Final   C Diff interpretation Negative for C. difficile  Final    Medical History: Past Medical History  Diagnosis Date  . Diabetes mellitus without complication ([place])   . Chronic kidney disease   . GERD (gastroesophageal reflux disease)   . Hypertension   . Ear pain   . Serum calcium elevated   .  Floaters   . Sleep apnea   . Cancer ([place])   . Liver cancer ([place])   . Cervical cancer ([place])   . Bone cancer ([place])   . CHF (congestive heart failure) ([place])   . COPD (chronic obstructive pulmonary disease) ([facility])     Medications:  Scheduled:  . atorvastatin  40 mg Oral Daily  . budesonide-formoterol  2 puff Inhalation BID  . buPROPion  100 mg Oral Daily  . calcium carbonate  1 tablet Oral TID WC  . calcium gluconate  2 g Intravenous Once  . [START ON 05/05/2015] cefTAZidime (FORTAZ)  IV  2 g Intravenous Q24H  . citalopram  40 mg Oral q morning - 10a  . enoxaparin (LOVENOX) injection  40 mg Subcutaneous  Q12H  . feeding supplement  1 Container Oral TID WC  . filgrastim  300 mcg Subcutaneous Daily  . gabapentin  300 mg Oral QHS  . insulin aspart  0-9 Units Subcutaneous TID WC  . insulin aspart  3 Units Subcutaneous TID WC  . insulin detemir  35 Units Subcutaneous QHS  . loratadine  10 mg Oral Daily  . magnesium sulfate 1 - 4 g bolus IVPB  2 g Intravenous Once  . metFORMIN  1,000 mg Oral BID WC  . metoprolol tartrate  12.5 mg Oral BID  . pantoprazole  40 mg Oral Daily  . potassium chloride  40 mEq Oral BID  . sodium chloride  3 mL Intravenous Q12H  . tiotropium  1 capsule Inhalation Daily  . traZODone  50 mg Oral QHS   Assessment: Patient is a 69 yo female admitted with N/V status post chemo.  Ceftazidime therapy initiated for treatment of febrile neutropenia.   SCr doubled overnight to 2.84, est CrCl~23 mL/min  Goal of Therapy:  Renal adjustment  Plan:  Will transition patient from Ceftazidime 2 gm IV q8h to q24h dosing due to acute renal failure.  Per recommendations, may use 50% greater then suggested renal adjustment for severe infections.  Therefore, will continue 2 gm dose vs 1 gm.  Follow up culture results   Pharmacy will continue to follow.  Adonis Ryther G 05/04/2015,12:16 PM

## 2015-05-04 NOTE — Progress Notes (Signed)
[facility] NAME: Jaine Estabrooks    MR#:  [id number]  DATE OF BIRTH:  1946-01-20  SUBJECTIVE:  CHIEF COMPLAINT:   Chief Complaint  Patient presents with  . Emesis    ems pt from home pt has history of Lung CA recent chemo and radiation has been vomiting since yesterday taking phenergan with no improvment   No more vomit, diarrhea. No complains of any pain. Have excessive shaking today, said - feels cold. She is on home O2 - 2 ltr/min.  REVIEW OF SYSTEMS:  CONSTITUTIONAL: No fever, positive for fatigue or weakness.  EYES: No blurred or double vision.  EARS, NOSE, AND THROAT: No tinnitus or ear pain.  RESPIRATORY: No cough, shortness of breath, wheezing or hemoptysis.  CARDIOVASCULAR: No chest pain, orthopnea, edema.  GASTROINTESTINAL: No for nausea, vomiting,  No diarrhea or abdominal pain.  GENITOURINARY: No dysuria, hematuria.  ENDOCRINE: No polyuria, nocturia,  HEMATOLOGY: No anemia, easy bruising or bleeding SKIN: No rash or lesion. MUSCULOSKELETAL: No joint pain or arthritis.   NEUROLOGIC: No tingling, numbness, weakness.  PSYCHIATRY: No anxiety or depression.   ROS  DRUG ALLERGIES:   Allergies  Allergen Reactions  . Peanut Oil Anaphylaxis  . Penicillins Hives    VITALS:  Blood pressure 144/53, pulse 113, temperature 98.5 F (36.9 C), temperature source Oral, resp. rate 20, height '5\' 5"'$  (1.651 m), weight 111.585 kg (246 lb), SpO2 100 %.  PHYSICAL EXAMINATION:  GENERAL:  69 y.o.-year-old patient lying in the bed with no acute distress.  EYES: Pupils equal, round, reactive to light and accommodation. No scleral icterus. Extraocular muscles intact.  HEENT: Head atraumatic, normocephalic. Oropharynx and nasopharynx clear.  NECK:  Supple, no jugular venous distention. No thyroid enlargement, no tenderness.  LUNGS: Normal breath sounds bilaterally, no wheezing, rales,rhonchi or crepitation. No use of accessory  muscles of respiration.  CARDIOVASCULAR: S1, S2 normal. No murmurs, rubs, or gallops.  ABDOMEN: Soft, nontender, nondistended. Bowel sounds present. No organomegaly or mass.  EXTREMITIES: No pedal edema, cyanosis, or clubbing.  NEUROLOGIC: Cranial nerves II through XII are intact. Muscle strength 5/5 in all extremities. Sensation intact. Gait not checked.  PSYCHIATRIC: The patient is alert and oriented x 3.  SKIN: No obvious rash, lesion, or ulcer.   Physical Exam LABORATORY PANEL:   CBC  Recent Labs Lab 05/03/15 0512  WBC 0.2*  HGB 10.4*  HCT 31.5*  PLT 58*   ------------------------------------------------------------------------------------------------------------------  Chemistries   Recent Labs Lab 05/02/15 0531 05/03/15 0512  NA 135 136  K 3.9 3.1*  CL 99* 106  CO2 24 24  GLUCOSE 279* 198*  BUN 25* 22*  CREATININE 1.14* 1.27*  CALCIUM 7.4* 6.1*  AST 21  --   ALT 25  --   ALKPHOS 183*  --   BILITOT 0.8  --    ------------------------------------------------------------------------------------------------------------------  Cardiac Enzymes  Recent Labs Lab 05/02/15 0531  TROPONINI <0.03   ------------------------------------------------------------------------------------------------------------------  RADIOLOGY:  Dg Abd 1 View  05/02/2015  CLINICAL DATA:  Vomiting and diarrhea. Weakness. Cough. Currently undergoing chemotherapy for metastatic lung cancer. EXAM: ABDOMEN - 1 VIEW COMPARISON:  Radiographs dated 01/17/2007 and PET-CT dated 04/10/2015 FINDINGS: The bowel gas pattern is normal. No visible free air or free fluid. No acute osseous abnormalities. IMPRESSION: Benign-appearing abdomen. Electronically Signed   By: Lorriane Shire M.D.   On: 05/02/2015 10:58   Dg Chest Port 1 View  05/03/2015  CLINICAL DATA:  Cough. EXAM: PORTABLE  CHEST 1 VIEW COMPARISON:  03/23/2015 FINDINGS: There is a right jugular Port-A-Cath with tip at the cavoatrial junction.  The left upper lobe mass is faintly visible. New no confluent airspace consolidation. No large effusion. Pulmonary vasculature is normal. IMPRESSION: Faint visibility of the left upper lobe mass. No acute findings are evident Electronically Signed   By: Andreas Newport M.D.   On: 05/03/2015 06:31    ASSESSMENT AND PLAN:   Principal Problem:   Nausea & vomiting Active Problems:   CKD (chronic kidney disease)   Pancytopenia ([place])   Community acquired pneumonia   Hypocalcemia   Protein-calorie malnutrition, severe  1. Nausea and vomiting, likely due to chemotherapy,   Symptomatic therapy, IV fluids, clear liquid diet, negative KUB   Now no more nausea in 24 hrs- upgrade to soft diet. 2. Pancytopenia, followed with conservative therapy, negative blood cultures, ceftazidime intravenously    Had last chemo 1 week ago, appreciated oncology consult. 3. Suspected pneumonia versus bronchitis, chest x-ray negative, on ceftazidime intravenously, getting sputum cultures, no source of infection yet.   Repeat Xray chest again. Last fever was on 05/03/15- in night at 2300. 4. Sinus tachycardia, possibly related to dehydration. Continue patient on IV fluids and follow clinically, continue metoprolol- also due to fever. 5. Chills. Questionable sepsis.  blood cultures, initiate antibiotic therapy, follow clinically and lab wise,    All the records are reviewed and case discussed with Care Management/Social Workerr. Management plans discussed with the patient, family and they are in agreement.  CODE STATUS: Full  TOTAL TIME TAKING CARE OF THIS PATIENT: 40 minutes.   Will get PT eval.  POSSIBLE D/C IN 1-2 DAYS, DEPENDING ON CLINICAL CONDITION.   Vaughan Basta M.D on 05/04/2015   Between 7am to 6pm - Pager - [phone]  After 6pm go to [email or web address] - password [email or web address] [facility]  Office  [phone]  CC: Primary care physician; Theotis Burrow,  MD  Note: This dictation was prepared with Dragon dictation along with smaller phrase technology. Any transcriptional errors that result from this process are unintentional.

## 2015-05-04 NOTE — Progress Notes (Signed)
Anticoagulation monitoring  Patient is a 69 yo female with orders for Lovenox 40 mg subq q12h due to BMI >40.  Per policy, will transition patient to Lovenox 40 mg subq q24h due to CrCl<30 mL/min.   Pharmacy will continue to follow.

## 2015-05-04 NOTE — Progress Notes (Signed)
PT Cancellation Note  Patient Details Name: Sherry Prince MRN: [id number] DOB: 08-14-1945   Cancelled Treatment:    Reason Eval/Treat Not Completed: Fatigue/lethargy limiting ability to participate (Chart reviewed for attempted treatment session.  Patient visibly shaking upon arrival, reports being "freezing cold".  Provided with additional blankets and thermostat adjusted per patient request.  CNA requested to take temp, noted at 99.28F orally.  Patient declining participation with therapy at this time secondary to fatigue/chills/rigors.  Will re-attempt at later time/date as appropriate.  )   Annalyce Lanpher H. Owens Shark, PT, DPT, NCS 05/04/2015, 2:09 PM [phone]

## 2015-05-04 NOTE — Progress Notes (Signed)
LAbs checked.  * Hypomagnesemia     - replace IV and recheck tomorrow.  * Hypocalcemia    - Replace IV , recheck tomorrow.  * Persistent sinus tachycardia   - may be due to electrolyte imbalance   - Metoprolol and cardizem oral.   - Cardiology consult.

## 2015-05-04 NOTE — Plan of Care (Deleted)
Problem: Education: Goal: Knowledge of [facility] Education information/materials will improve Outcome: Progressing Blood infusion teaching. 1.  Signs and symptoms of reaction. 2. When to call the nurse.  Problem: Safety: Goal: Ability to remain free from injury will improve Outcome: Progressing Pt instructed to call nurse prior to getting up.

## 2015-05-05 LAB — BASIC METABOLIC PANEL
ANION GAP: 10 (ref 5–15)
BUN: 35 mg/dL — ABNORMAL HIGH (ref 6–20)
CHLORIDE: 112 mmol/L — AB (ref 101–111)
CO2: 16 mmol/L — AB (ref 22–32)
CREATININE: 2.42 mg/dL — AB (ref 0.44–1.00)
Calcium: 6.7 mg/dL — ABNORMAL LOW (ref 8.9–10.3)
GFR calc non Af Amer: 19 mL/min — ABNORMAL LOW (ref >60)
GFR, EST AFRICAN AMERICAN: 22 mL/min — AB (ref >60)
Glucose, Bld: 114 mg/dL — ABNORMAL HIGH (ref 65–99)
POTASSIUM: 4.1 mmol/L (ref 3.5–5.1)
SODIUM: 138 mmol/L (ref 135–145)

## 2015-05-05 LAB — GLUCOSE, CAPILLARY
GLUCOSE-CAPILLARY: 158 mg/dL — AB (ref 65–99)
GLUCOSE-CAPILLARY: 116 mg/dL — AB (ref 65–99)
GLUCOSE-CAPILLARY: 149 mg/dL — AB (ref 65–99)
Glucose-Capillary: 130 mg/dL — ABNORMAL HIGH (ref 65–99)

## 2015-05-05 LAB — CBC
HEMATOCRIT: 28.8 % — AB (ref 35.0–47.0)
Hemoglobin: 9.3 g/dL — ABNORMAL LOW (ref 12.0–16.0)
MCH: 28.5 pg (ref 26.0–34.0)
MCHC: 32.5 g/dL (ref 32.0–36.0)
MCV: 87.8 fL (ref 80.0–100.0)
Platelets: 54 10*3/uL — ABNORMAL LOW (ref 150–440)
RBC: 3.28 MIL/uL — AB (ref 3.80–5.20)
RDW: 14.6 % — AB (ref 11.5–14.5)
WBC: 0.3 10*3/uL — AB (ref 3.6–11.0)

## 2015-05-05 LAB — MAGNESIUM: MAGNESIUM: 1.1 mg/dL — AB (ref 1.7–2.4)

## 2015-05-05 MED ORDER — MAGNESIUM SULFATE 4 GM/100ML IV SOLN
4.0000 g | Freq: Once | INTRAVENOUS | Status: AC
  Administered 2015-05-05: 4 g via INTRAVENOUS
  Filled 2015-05-05: qty 100

## 2015-05-05 MED ORDER — LEVALBUTEROL HCL 1.25 MG/0.5ML IN NEBU
1.2500 mg | INHALATION_SOLUTION | RESPIRATORY_TRACT | Status: DC
  Administered 2015-05-05 – 2015-05-06 (×6): 1.25 mg via RESPIRATORY_TRACT
  Filled 2015-05-05 (×8): qty 0.5

## 2015-05-05 NOTE — Consult Note (Signed)
[facility]  CARDIOLOGY CONSULT NOTE  Patient ID: Sherry Prince MRN: [id number] DOB/AGE: 1945-07-27 69 y.o.  Admit date: 05/02/2015 Referring Physician Dr. Bridgett Larsson Primary Physician   Primary Cardiologist   Reason for Consultation SOB and tachycardia  HPI: Pt is a 69 yo female with history of stage iv breast carcinoma and small cell lung carcinoma, ckd, hpertension admitted with nausea and vomiting which has occurred since recent chemo and radiation treatment. She has beem noted ot be relatively tachycardic. She has had progressive sob. She is currently relatively hypotensive and sob. CXR shows no acute cardiopulmonary disease. EKG showed sinus tachycardia. She is currently on diltiazem 30 q 6. She is also on metoprolol.    ROS Review of Systems - History obtained from chart review General ROS: weakness, nausea and vomiting Respiratory ROS: positive for - cough and shortness of breath Cardiovascular ROS: negative for - chest pain Gastrointestinal ROS: positive for - nausea/vomiting Neurological ROS: no TIA or stroke symptoms   Past Medical History  Diagnosis Date  . Diabetes mellitus without complication ([place])   . Chronic kidney disease   . GERD (gastroesophageal reflux disease)   . Hypertension   . Ear pain   . Serum calcium elevated   . Floaters   . Sleep apnea   . Cancer ([place])   . Liver cancer ([place])   . Cervical cancer ([place])   . Bone cancer ([place])   . CHF (congestive heart failure) ([place])   . COPD (chronic obstructive pulmonary disease) ([facility])     Family History  Problem Relation Age of Onset  . Cancer Father     Social History   Social History  . Marital Status: Widowed    Spouse Name: N/A  . Number of Children: N/A  . Years of Education: N/A   Occupational History  . Not on file.   Social History Main Topics  . Smoking status: Former Research scientist (life sciences)  . Smokeless tobacco: Not on file  . Alcohol Use: No  . Drug Use: No  . Sexual  Activity: Not on file   Other Topics Concern  . Not on file   Social History Narrative    Past Surgical History  Procedure Laterality Date  . Abdominal hysterectomy    . Breast surgery    . Eye surgery    . Endobronchial ultrasound N/A 03/29/2015    Procedure: ENDOBRONCHIAL ULTRASOUND;  Surgeon: Flora Lipps, MD;  Location: [facility] ORS;  Service: Cardiopulmonary;  Laterality: N/A;     Facility-administered medications prior to admission  Medication Dose Route Frequency Provider Last Rate Last Dose  . sodium chloride 0.9 % injection 20 mL  20 mL Other Once Mohammed Kindle, MD       Prescriptions prior to admission  Medication Sig Dispense Refill Last Dose  . albuterol (PROVENTIL) (2.5 MG/3ML) 0.083% nebulizer solution Take 2.5 mg by nebulization every 2 (two) hours as needed for wheezing or shortness of breath.   05/01/2015 at Unknown time  . ARTIFICIAL TEARS 0.1-0.3 % SOLN Place 1 drop into both eyes as needed (dry eyes).    prn at prn  . atorvastatin (LIPITOR) 40 MG tablet Take 40 mg by mouth daily.   05/01/2015 at Unknown time  . budesonide-formoterol (SYMBICORT) 160-4.5 MCG/ACT inhaler Inhale 2 puffs into the lungs 2 (two) times daily.   05/01/2015 at Unknown time  . buPROPion (WELLBUTRIN SR) 100 MG 12 hr tablet Take 100 mg by mouth daily.   05/01/2015 at  Unknown time  . citalopram (CELEXA) 40 MG tablet Take 40 mg by mouth every morning.   05/01/2015 at Unknown time  . fluticasone (FLONASE) 50 MCG/ACT nasal spray Place 1 spray into the nose daily as needed for allergies or rhinitis.    05/01/2015 at Unknown time  . furosemide (LASIX) 40 MG tablet Take 1 tablet (40 mg total) by mouth daily. 30 tablet 0 05/01/2015 at Unknown time  . gabapentin (NEURONTIN) 300 MG capsule Take 300 mg by mouth at bedtime.   05/01/2015 at Unknown time  . insulin detemir (LEVEMIR) 100 UNIT/ML injection Inject 35 Units into the skin at bedtime.   05/01/2015 at Unknown time  . isosorbide mononitrate (IMDUR) 30 MG 24 hr  tablet Take 30 mg by mouth daily.   05/01/2015 at Unknown time  . lisinopril (PRINIVIL,ZESTRIL) 10 MG tablet Take 10 mg by mouth daily.   05/01/2015 at Unknown time  . loratadine (CLARITIN) 10 MG tablet Take 10 mg by mouth daily.   05/01/2015 at Unknown time  . LORazepam (ATIVAN) 1 MG tablet Take 1 mg by mouth every 8 (eight) hours as needed for anxiety.   prn at prn  . metFORMIN (GLUCOPHAGE) 1000 MG tablet Take 1,000 mg by mouth 2 (two) times daily with a meal.    05/01/2015 at Unknown time  . metoprolol tartrate (LOPRESSOR) 25 MG tablet Take 25 mg by mouth 2 (two) times daily.   05/01/2015 at Unknown time  . omeprazole (PRILOSEC) 20 MG capsule Take 20 mg by mouth 2 (two) times daily before a meal.   05/01/2015 at Unknown time  . promethazine (PHENERGAN) 25 MG tablet Take 25 mg by mouth every 6 (six) hours as needed for nausea or vomiting.   prn at prn  . tiotropium (SPIRIVA HANDIHALER) 18 MCG inhalation capsule Place 1 capsule into inhaler and inhale daily.   05/01/2015 at Unknown time  . traZODone (DESYREL) 50 MG tablet Take 1 tablet (50 mg total) by mouth at bedtime. 30 tablet 2 05/01/2015 at Unknown time  . HYDROcodone-acetaminophen (NORCO/VICODIN) 5-325 MG tablet Take 1-2 tablets by mouth every 6 (six) hours as needed for moderate pain or severe pain. 20 tablet 0 PRN at PRN  . lidocaine-prilocaine (EMLA) cream Apply to affected area once 30 g 3 PRN at PRN  . prochlorperazine (COMPAZINE) 10 MG tablet Take 1 tablet (10 mg total) by mouth every 6 (six) hours as needed (Nausea or vomiting). 30 tablet 1 PRN at PRN    Physical Exam: Blood pressure 124/78, pulse 135, temperature 100 F (37.8 C), temperature source Oral, resp. rate 20, height '5\' 5"'$  (1.651 m), weight 111.585 kg (246 lb), SpO2 99 %..   General appearance: cooperative and moderate distress Head: Normocephalic, without obvious abnormality, atraumatic Resp: rhonchi bilaterally Cardio: regular rate and rhythm GI: abnormal findings:  mild  tenderness in the upper abdomen Extremities: extremities normal, atraumatic, no cyanosis or edema Neurologic: Grossly normal Labs:   Lab Results  Component Value Date   WBC 0.3* 05/05/2015   HGB 9.3* 05/05/2015   HCT 28.8* 05/05/2015   MCV 87.8 05/05/2015   PLT 54* 05/05/2015    Recent Labs Lab 05/02/15 0531  05/05/15 0452  NA 135  < > 138  K 3.9  < > 4.1  CL 99*  < > 112*  CO2 24  < > 16*  BUN 25*  < > 35*  CREATININE 1.14*  < > 2.42*  CALCIUM 7.4*  < > 6.7*  PROT 6.6  --   --  BILITOT 0.8  --   --   ALKPHOS 183*  --   --   ALT 25  --   --   AST 21  --   --   GLUCOSE 279*  < > 114*  < > = values in this interval not displayed. Lab Results  Component Value Date   CKMB 0.8 03/24/2014   TROPONINI <0.03 05/02/2015      Radiology: no acute cardiopulmonary disease EKG: Sinus tachycardia  ASSESSMENT AND PLAN:  Pt with history of recent chemo and xrt for small cell lung ca with resultant nausea and vominting. Now with sinus tachycardia.   Appears to be secondary to nausea and vomiting and relative volume depletion. Would agree with current theapy. Continue metoprolol and dilt and carelully repleat volume. Will folow with you. Does not appear to be primary dysrhythmia but secondary to comorbid condition Signed: Teodoro Spray MD, [facility] 05/05/2015, 10:46 PM

## 2015-05-05 NOTE — Progress Notes (Signed)
Pt's breathing has worsened over the course of the day, audible wheezing heard, tremors have not improved, pt can not hold onto things due to her shaking hands. Dr. Bridgett Larsson notified of all 3 sets of vitals taken during the day, I asked for Lasix, and Iv steroids for her respiratory issues. Dr Bridgett Larsson denied x2 for the medications stating her first blood pressure of the day was too low to give lasix and wbc was too low for steroids, MD is worried about pt getting an infection. MD made aware of increased heart rates reaching the 140's. C-PAP will be ordered by respiratory for tonight.

## 2015-05-05 NOTE — Plan of Care (Signed)
Problem: Fluid Volume: Goal: Ability to maintain a balanced intake and output will improve Outcome: Progressing Plan of care, progress to goals. 1.  Pt continues to have loose stool frequently with abdominal cramps.  PRN compazine, phenergan, Norco and IV MS given throughout the night until pt comfortable. 2. Vital signs improving with less tachycardia, BP more within normal range, one temperature reading of 100. 3. Continues with expiratory wheeze, no cough or sputum production at this time. 4.  Hypomagnesemia and hypocalcemia corrected. 5. Tolerating fluids but decreased intake of solid/ decreased appetite. Dorna Bloom RN

## 2015-05-05 NOTE — Progress Notes (Signed)
Physical Therapy Evaluation Patient Details Name: Sherry Prince MRN: [id number] DOB: 1945/10/23 Today's Date: 05/05/2015   History of Present Illness  Pt. is a 69 year old female admitted to [facility] on 05/02/15 with weakness, nausea and vomiting. She has a medical history of Stage IV breast carcinoma as well as small cell lung carcinoma. She uses a rolling walker and has home O2. She does live in a 1 story home with a ramp. but does feel that she needs to go to rehab first.   Clinical Impression  Patient presents with decreased bed mobility, transfers, gait, and activity tolerance. She did have to take a break after supine to sit due to increased SP O2 and heart rate (at rest, heart rate was 109bpm and 97% SPO2), which did increase with activity. Patient may benefit from skilled PT to address mobility, gait, transfers, strengthening, use of assistive devices and safety with mobility tasks.     Follow Up Recommendations SNF    Equipment Recommendations       Recommendations for Other Services       Precautions / Restrictions Restrictions Weight Bearing Restrictions: No      Mobility  Bed Mobility Overal bed mobility: Needs Assistance Bed Mobility: Supine to Sit     Supine to sit: Mod assist     General bed mobility comments: Pt. does need moderate assistance for supine to sit; she does get fatigued and has an increased respiration rate/heart rate with (98% SPO2 and 149bpm)  Transfers Overall transfer level: Needs assistance Equipment used: Rolling walker (2 wheeled) Transfers: Sit to/from Stand Sit to Stand: Min assist         General transfer comment: Increased cueing to push from bed; increased respiration/heart rate with sit to stand and then walking 3-4 feet to bedside commode (94% SPO2; 149bpm)  Ambulation/Gait Ambulation/Gait assistance: Min assist Ambulation Distance (Feet): 4 Feet Assistive device: Rolling walker (2 wheeled)          Stairs             Wheelchair Mobility    Modified Rankin (Stroke Patients Only)       Balance Overall balance assessment: Independent (no assistance needed at edge of bed)                                           Pertinent Vitals/Pain Pain Assessment: No/denies pain (but feels nauseated)    Home Living Family/patient expects to be discharged to:: Skilled nursing facility                 Additional Comments: Pt. reports that she thought that she would need to go to rehab before going home.     Prior Function Level of Independence: Independent               Hand Dominance        Extremity/Trunk Assessment   Upper Extremity Assessment: Overall WFL for tasks assessed           Lower Extremity Assessment: Generalized weakness         Communication      Cognition Arousal/Alertness: Awake/alert Behavior During Therapy: WFL for tasks assessed/performed Overall Cognitive Status: Within Functional Limits for tasks assessed                      General Comments  Exercises        Assessment/Plan    PT Assessment Patient needs continued PT services  PT Diagnosis Difficulty walking;Generalized weakness   PT Problem List Decreased strength;Decreased activity tolerance;Decreased balance;Decreased mobility  PT Treatment Interventions Gait training;Functional mobility training;Therapeutic activities;Therapeutic exercise;Patient/family education   PT Goals (Current goals can be found in the Care Plan section) Acute Rehab PT Goals Patient Stated Goal: To go to rehab PT Goal Formulation: With patient Time For Goal Achievement: 05/19/15 Potential to Achieve Goals: Good    Frequency Min 2X/week   Barriers to discharge        Co-evaluation               End of Session Equipment Utilized During Treatment: Gait belt Activity Tolerance: Patient limited by fatigue (pt. also needed to use the restroom quickly) Patient left: with  call bell/phone within reach (bedside commode with CNA) Nurse Communication:  (General tolerance to treatment)         Time: 1128-1140 PT Time Calculation (min) (ACUTE ONLY): 12 min   Charges:   PT Evaluation $Initial PT Evaluation Tier I: 1 Procedure     PT G Codes:        Ladell Pier Derk Doubek PT, DPT, CWCE 05/05/2015, 2:04 PM

## 2015-05-05 NOTE — Progress Notes (Addendum)
[place] at [place] NAME: Sherry Prince    MR#:  [id number]  DATE OF BIRTH:  October 11, 1945  SUBJECTIVE:  CHIEF COMPLAINT:   Chief Complaint  Patient presents with  . Emesis    ems pt from home pt has history of Lung CA recent chemo and radiation has been vomiting since yesterday taking phenergan with no improvment   Wheezing and SOB. No more vomit, but has diarrhea. She is on home O2 - 2 ltr/min.  REVIEW OF SYSTEMS:  CONSTITUTIONAL: No fever, positive for fatigue or weakness.  EYES: No blurred or double vision.  EARS, NOSE, AND THROAT: No tinnitus or ear pain.  RESPIRATORY: No cough, has shortness of breath and wheezing but no hemoptysis.  CARDIOVASCULAR: No chest pain, orthopnea, edema.  GASTROINTESTINAL: No for nausea, vomiting, or abdominal pain. But has diarrhea. GENITOURINARY: No dysuria, hematuria.  ENDOCRINE: No polyuria, nocturia,  HEMATOLOGY: No anemia, easy bruising or bleeding SKIN: No rash or lesion.  MUSCULOSKELETAL: No joint pain or arthritis.   NEUROLOGIC: No tingling, numbness, weakness.  PSYCHIATRY: No anxiety or depression.   ROS  DRUG ALLERGIES:   Allergies  Allergen Reactions  . Peanut Oil Anaphylaxis  . Penicillins Hives    VITALS:  Blood pressure 105/56, pulse 114, temperature 99.5 F (37.5 C), temperature source Oral, resp. rate 24, height '5\' 5"'$  (1.651 m), weight 111.585 kg (246 lb), SpO2 98 %.  PHYSICAL EXAMINATION:  GENERAL:  69 y.o.-year-old patient lying in the bed with no acute distress.  EYES: Pupils equal, round, reactive to light and accommodation. No scleral icterus. Extraocular muscles intact.  HEENT: Head atraumatic, normocephalic. Oropharynx and nasopharynx clear.  NECK:  Supple, no jugular venous distention. No thyroid enlargement, no tenderness.  LUNGS: Diminished breath sounds bilaterally, upper airway wheezing, no rales,rhonchi or crepitation. No use of accessory muscles of  respiration.  CARDIOVASCULAR: S1, S2 normal. No murmurs, rubs, or gallops.  ABDOMEN: Soft, nontender, nondistended. Bowel sounds present. No organomegaly or mass.  EXTREMITIES: No pedal edema, cyanosis, or clubbing.  NEUROLOGIC: Cranial nerves II through XII are intact. Muscle strength 4/5 in all extremities. Sensation intact. Gait not checked.  PSYCHIATRIC: The patient is alert and oriented x 3.  SKIN: No obvious rash, lesion, or ulcer.   Physical Exam LABORATORY PANEL:   CBC  Recent Labs Lab 05/05/15 0452  WBC 0.3*  HGB 9.3*  HCT 28.8*  PLT 54*   ------------------------------------------------------------------------------------------------------------------  Chemistries   Recent Labs Lab 05/02/15 0531  05/05/15 0452  NA 135  < > 138  K 3.9  < > 4.1  CL 99*  < > 112*  CO2 24  < > 16*  GLUCOSE 279*  < > 114*  BUN 25*  < > 35*  CREATININE 1.14*  < > 2.42*  CALCIUM 7.4*  < > 6.7*  MG  --   < > 1.1*  AST 21  --   --   ALT 25  --   --   ALKPHOS 183*  --   --   BILITOT 0.8  --   --   < > = values in this interval not displayed. ------------------------------------------------------------------------------------------------------------------  Cardiac Enzymes  Recent Labs Lab 05/02/15 0531  TROPONINI <0.03   ------------------------------------------------------------------------------------------------------------------  RADIOLOGY:  Dg Chest 2 View  05/04/2015  CLINICAL DATA:  Fever, nausea, shortness of Breath EXAM: CHEST  2 VIEW COMPARISON:  05/03/2015 FINDINGS: Cardiomediastinal silhouette is stable. Right IJ Port-A-Cath is unchanged in position. No  acute infiltrate or pleural effusion. No pulmonary edema osteopenia and mild degenerative changes thoracic spine. Left upper lobe lesion again noted IMPRESSION: No active cardiopulmonary disease. Again noted left upper lobe lesion. Stable right IJ Port-A-Cath position. Electronically Signed   By: Lahoma Crocker M.D.    On: 05/04/2015 12:07    ASSESSMENT AND PLAN:   Principal Problem:   Nausea & vomiting Active Problems:   CKD (chronic kidney disease)   Pancytopenia ([place])   Community acquired pneumonia   Hypocalcemia   Protein-calorie malnutrition, severe  1. Nausea and vomiting, likely due to chemotherapy, improved.  Symptomatic therapy, IV fluids, started soft diet, negative KUB  2. Pancytopenia, followed with conservative therapy, negative blood cultures, ceftazidime intravenously. neupogen daily, f/u CBC.     3. Suspected pneumonia versus bronchitis, chest x-ray negative, on ceftazidime intravenously, Negative sputum cultures, no source of infection yet.   Repeat Xray chest: unremarkable.  4. Sinus tachycardia, possibly related to ARF/dehydration. Continue patient on IV fluids and follow clinically, continue metoprolol.   * Hypomagnesemia. Magnesium level is 1.1, given magnesium 4 g IV today. Follow-up level. *Hypokalemia, improved. *Acute renal failure. Was on IV fluid, which was discontinued this morning due to wheezing and shortness of breath. Follow-up BMP.  *COPD. Upper airway wheezing. Change from DuoNeb to Xopenex due to tachycardia. Continue Flonase and the Spiriva. * Sleep apnea. CPAP at night. *Diabetes. Continue sliding scale, NovoLog 3 times a day before meals and Levemir 35 units at at bedtime. * Hypertension. Continue Lopressor and Cardizem.  Per PT, the patient needs skilled nursing facility placement.   All the records are reviewed and case discussed with Care Management/Social Workerr. Management plans discussed with the patient, family and they are in agreement.  CODE STATUS: Full  TOTAL TIME TAKING CARE OF THIS PATIENT: 43  minutes.    POSSIBLE D/C IN 1-2 DAYS, DEPENDING ON CLINICAL CONDITION.   Demetrios Loll M.D on 05/05/2015   Between 7am to 6pm - Pager - [phone]  After 6pm go to [email or web address] - password [email or web address] [place] [facility]  Office   [phone]  CC: Primary care physician; Theotis Burrow, MD  Note: This dictation was prepared with Dragon dictation along with smaller phrase technology. Any transcriptional errors that result from this process are unintentional.

## 2015-05-05 NOTE — Plan of Care (Signed)
Problem: Acute Rehab PT Goals(only PT should resolve) Goal: Pt Will Go Supine/Side To Sit To help assist patient to sit up for meals.  Goal: Patient Will Transfer Sit To/From Stand And least restrictive assistive device to enable patient to stand up and get to restroom safely. Goal: Pt Will Ambulate To assist with in-home ambulation. Goal: Pt/caregiver will Perform Home Exercise Program To help increase strength for transfers and getting out of bed.

## 2015-05-06 DIAGNOSIS — I129 Hypertensive chronic kidney disease with stage 1 through stage 4 chronic kidney disease, or unspecified chronic kidney disease: Secondary | ICD-10-CM

## 2015-05-06 DIAGNOSIS — R112 Nausea with vomiting, unspecified: Secondary | ICD-10-CM

## 2015-05-06 DIAGNOSIS — C3402 Malignant neoplasm of left main bronchus: Secondary | ICD-10-CM

## 2015-05-06 DIAGNOSIS — N189 Chronic kidney disease, unspecified: Secondary | ICD-10-CM

## 2015-05-06 DIAGNOSIS — D701 Agranulocytosis secondary to cancer chemotherapy: Secondary | ICD-10-CM

## 2015-05-06 DIAGNOSIS — R630 Anorexia: Secondary | ICD-10-CM

## 2015-05-06 DIAGNOSIS — R5383 Other fatigue: Secondary | ICD-10-CM

## 2015-05-06 DIAGNOSIS — T451X5A Adverse effect of antineoplastic and immunosuppressive drugs, initial encounter: Secondary | ICD-10-CM

## 2015-05-06 DIAGNOSIS — D619 Aplastic anemia, unspecified: Secondary | ICD-10-CM

## 2015-05-06 DIAGNOSIS — R531 Weakness: Secondary | ICD-10-CM

## 2015-05-06 DIAGNOSIS — R5381 Other malaise: Secondary | ICD-10-CM

## 2015-05-06 DIAGNOSIS — T451X5S Adverse effect of antineoplastic and immunosuppressive drugs, sequela: Secondary | ICD-10-CM

## 2015-05-06 LAB — BASIC METABOLIC PANEL
ANION GAP: 11 (ref 5–15)
BUN: 41 mg/dL — AB (ref 6–20)
CO2: 14 mmol/L — ABNORMAL LOW (ref 22–32)
Calcium: 7 mg/dL — ABNORMAL LOW (ref 8.9–10.3)
Chloride: 106 mmol/L (ref 101–111)
Creatinine, Ser: 2.5 mg/dL — ABNORMAL HIGH (ref 0.44–1.00)
GFR, EST AFRICAN AMERICAN: 21 mL/min — AB (ref >60)
GFR, EST NON AFRICAN AMERICAN: 19 mL/min — AB (ref >60)
Glucose, Bld: 154 mg/dL — ABNORMAL HIGH (ref 65–99)
POTASSIUM: 3.4 mmol/L — AB (ref 3.5–5.1)
SODIUM: 131 mmol/L — AB (ref 135–145)

## 2015-05-06 LAB — CBC
HEMATOCRIT: 30.3 % — AB (ref 35.0–47.0)
Hemoglobin: 10 g/dL — ABNORMAL LOW (ref 12.0–16.0)
MCH: 29.1 pg (ref 26.0–34.0)
MCHC: 33.1 g/dL (ref 32.0–36.0)
MCV: 87.8 fL (ref 80.0–100.0)
Platelets: 78 10*3/uL — ABNORMAL LOW (ref 150–440)
RBC: 3.45 MIL/uL — AB (ref 3.80–5.20)
RDW: 14.9 % — AB (ref 11.5–14.5)
WBC: 0.8 10*3/uL — AB (ref 3.6–11.0)

## 2015-05-06 LAB — GLUCOSE, CAPILLARY
GLUCOSE-CAPILLARY: 87 mg/dL (ref 65–99)
GLUCOSE-CAPILLARY: 71 mg/dL (ref 65–99)
GLUCOSE-CAPILLARY: 89 mg/dL (ref 65–99)
Glucose-Capillary: 108 mg/dL — ABNORMAL HIGH (ref 65–99)

## 2015-05-06 LAB — CBC WITH DIFFERENTIAL/PLATELET
Basophils Absolute: 0 10*3/uL (ref 0–0.1)
Basophils Relative: 0 %
Eosinophils Absolute: 0 10*3/uL (ref 0–0.7)
HEMATOCRIT: 30.8 % — AB (ref 35.0–47.0)
HEMOGLOBIN: 10 g/dL — AB (ref 12.0–16.0)
LYMPHS ABS: 0.2 10*3/uL — AB (ref 1.0–3.6)
MCH: 28.1 pg (ref 26.0–34.0)
MCHC: 32.6 g/dL (ref 32.0–36.0)
MCV: 86.2 fL (ref 80.0–100.0)
Monocytes Absolute: 0.4 10*3/uL (ref 0.2–0.9)
Monocytes Relative: 30 %
NEUTROS ABS: 0.8 10*3/uL — AB (ref 1.4–6.5)
Neutrophils Relative %: 55 %
PLATELETS: 87 10*3/uL — AB (ref 150–440)
RBC: 3.57 MIL/uL — AB (ref 3.80–5.20)
RDW: 14.4 % (ref 11.5–14.5)
WBC: 1.5 10*3/uL — AB (ref 3.6–11.0)

## 2015-05-06 LAB — STOOL CULTURE

## 2015-05-06 LAB — MAGNESIUM: MAGNESIUM: 2.2 mg/dL (ref 1.7–2.4)

## 2015-05-06 MED ORDER — LEVALBUTEROL HCL 1.25 MG/0.5ML IN NEBU
1.2500 mg | INHALATION_SOLUTION | Freq: Four times a day (QID) | RESPIRATORY_TRACT | Status: DC
  Administered 2015-05-07 – 2015-05-08 (×5): 1.25 mg via RESPIRATORY_TRACT
  Filled 2015-05-06 (×7): qty 0.5

## 2015-05-06 MED ORDER — FUROSEMIDE 10 MG/ML IJ SOLN
20.0000 mg | Freq: Two times a day (BID) | INTRAMUSCULAR | Status: DC
  Administered 2015-05-06 – 2015-05-07 (×4): 20 mg via INTRAVENOUS
  Filled 2015-05-06 (×5): qty 2

## 2015-05-06 MED ORDER — LEVALBUTEROL HCL 1.25 MG/0.5ML IN NEBU
1.2500 mg | INHALATION_SOLUTION | RESPIRATORY_TRACT | Status: DC | PRN
  Administered 2015-05-08: 1.25 mg via RESPIRATORY_TRACT
  Filled 2015-05-06: qty 0.5

## 2015-05-06 MED ORDER — POTASSIUM CHLORIDE CRYS ER 20 MEQ PO TBCR
40.0000 meq | EXTENDED_RELEASE_TABLET | Freq: Once | ORAL | Status: AC
  Administered 2015-05-06: 40 meq via ORAL
  Filled 2015-05-06: qty 2

## 2015-05-06 NOTE — Progress Notes (Signed)
[place] at [place] NAME: Sherry Prince    MR#:  [id number]  DATE OF BIRTH:  11-12-1945  SUBJECTIVE:  CHIEF COMPLAINT:   Chief Complaint  Patient presents with  . Emesis    ems pt from home pt has history of Lung CA recent chemo and radiation has been vomiting since yesterday taking phenergan with no improvment   Vomited once last night. No Wheezing or SOB.  on O2 [place] 2L.  REVIEW OF SYSTEMS:  CONSTITUTIONAL: No fever, positive for fatigue or weakness.  EYES: No blurred or double vision.  EARS, NOSE, AND THROAT: No tinnitus or ear pain.  RESPIRATORY: No cough, shortness of breath, wheezing or hemoptysis.  CARDIOVASCULAR: No chest pain, orthopnea, edema.  GASTROINTESTINAL: No for nausea, vomiting, or abdominal pain. But has diarrhea. GENITOURINARY: No dysuria, hematuria.  ENDOCRINE: No polyuria, nocturia,  HEMATOLOGY: No anemia, easy bruising or bleeding SKIN: No rash or lesion.  MUSCULOSKELETAL: No joint pain or arthritis.   NEUROLOGIC: No tingling, numbness, weakness.  PSYCHIATRY: No anxiety or depression.   ROS  DRUG ALLERGIES:   Allergies  Allergen Reactions  . Peanut Oil Anaphylaxis  . Penicillins Hives    VITALS:  Blood pressure 110/84, pulse 119, temperature 98.5 F (36.9 C), temperature source Oral, resp. rate 18, height '5\' 5"'$  (1.651 m), weight 111.585 kg (246 lb), SpO2 100 %.  PHYSICAL EXAMINATION:  GENERAL:  69 y.o.-year-old patient lying in the bed with no acute distress.  EYES: Pupils equal, round, reactive to light and accommodation. No scleral icterus. Extraocular muscles intact.  HEENT: Head atraumatic, normocephalic. Oropharynx and nasopharynx clear.  NECK:  Supple, no jugular venous distention. No thyroid enlargement, no tenderness.  LUNGS: Diminished breath sounds bilaterally, no wheezing, no rales,rhonchi or crepitation. No use of accessory muscles of respiration.  CARDIOVASCULAR: S1, S2 normal. No  murmurs, rubs, or gallops.  ABDOMEN: Soft, nontender, nondistended. Bowel sounds present. No organomegaly or mass.  EXTREMITIES: No pedal edema, cyanosis, or clubbing.  NEUROLOGIC: Cranial nerves II through XII are intact. Muscle strength 4/5 in all extremities. Sensation intact. Gait not checked.  PSYCHIATRIC: The patient is alert and oriented x 3.  SKIN: No obvious rash, lesion, or ulcer.   Physical Exam LABORATORY PANEL:   CBC  Recent Labs Lab 05/06/15 0329  WBC 0.8*  HGB 10.0*  HCT 30.3*  PLT 78*   ------------------------------------------------------------------------------------------------------------------  Chemistries   Recent Labs Lab 05/02/15 0531  05/06/15 0329  NA 135  < > 131*  K 3.9  < > 3.4*  CL 99*  < > 106  CO2 24  < > 14*  GLUCOSE 279*  < > 154*  BUN 25*  < > 41*  CREATININE 1.14*  < > 2.50*  CALCIUM 7.4*  < > 7.0*  MG  --   < > 2.2  AST 21  --   --   ALT 25  --   --   ALKPHOS 183*  --   --   BILITOT 0.8  --   --   < > = values in this interval not displayed. ------------------------------------------------------------------------------------------------------------------  Cardiac Enzymes  Recent Labs Lab 05/02/15 0531  TROPONINI <0.03   ------------------------------------------------------------------------------------------------------------------  RADIOLOGY:  No results found.  ASSESSMENT AND PLAN:   Principal Problem:   Nausea & vomiting Active Problems:   CKD (chronic kidney disease)   Pancytopenia ([place])   Community acquired pneumonia   Hypocalcemia   Protein-calorie malnutrition, severe   Chemotherapy-induced  neutropenia ([place])  1. Nausea and vomiting, likely due to chemotherapy, improved.  Symptomatic therapy, discontinued IV fluids, started soft diet, negative KUB  2. Pancytopenia, WBC is improving, conservative therapy, negative blood cultures, ceftazidime intravenously. neupogen daily, f/u CBC.     3. Suspected  pneumonia versus bronchitis, chest x-ray negative, on ceftazidime intravenously, Negative sputum cultures, no source of infection yet.   Repeat Xray chest: unremarkable.  4. Sinus tachycardia, possibly related to ARF/dehydration. continue metoprolol.   * Hypomagnesemia. Improved after magnesium IV. *Hypokalemia, potassium supplement and follow-up BMP. *Acute renal failure. Was on IV fluid, which was discontinued yesterday due to wheezing and shortness of breath. Follow-up BMP.  *COPD. continue Xopenex, Flonase and the Spiriva.  * Sleep apnea. CPAP at night.  *Diabetes. Continue sliding scale, NovoLog 3 times a day before meals and Levemir 35 units at at bedtime.  * Hypertension. Continue Lopressor and Cardizem.  * Small cell lung cancer. Status post chemotherapy. Hold chemotherapy per oncologist.  History of chronic CHF. Restart Lasix but hold if blood pressure is normal.  Per PT, the patient needs skilled nursing facility placement.   All the records are reviewed and case discussed with Care Management/Social Workerr. Management plans discussed with the patient, her daughter and they are in agreement. Greater than 50% time was spent on coordination of care and face-to-face counseling.  CODE STATUS: Full code  TOTAL TIME TAKING CARE OF THIS PATIENT: 43  minutes.    POSSIBLE D/C TO SKILLED NURSING FACILITY IN 2 DAYS, DEPENDING ON CLINICAL CONDITION.   Demetrios Loll M.D on 05/06/2015   Between 7am to 6pm - Pager - [phone]  After 6pm go to [email or web address] - password [email or web address] [place] [facility]  Office  [phone]  CC: Primary care physician; Theotis Burrow, MD  Note: This dictation was prepared with Dragon dictation along with smaller phrase technology. Any transcriptional errors that result from this process are unintentional.

## 2015-05-06 NOTE — NC FL2 (Signed)
[place] LEVEL OF CARE SCREENING TOOL     IDENTIFICATION  Patient Name: Sherry Prince Birthdate: 10/21/1945 Sex: female Admission Date (Current Location): 05/02/2015  [place] and [place] Number:  [facility] )   Facility and Address:  [facility], [address] [place], [place] [address]      Provider Number: [id number])  Attending Physician Name and Address:  Demetrios Loll, MD  Relative Name and Phone Number:       Current Level of Care: Hospital Recommended Level of Care: [place] Prior Approval Number:    Date Approved/Denied:   PASRR Number:  [id number])  Discharge Plan: SNF    Current Diagnoses: Patient Active Problem List   Diagnosis Date Noted  . Hypocalcemia 05/03/2015  . Protein-calorie malnutrition, severe 05/03/2015  . Nausea & vomiting 05/02/2015  . CKD (chronic kidney disease) 05/02/2015  . Pancytopenia ([place]) 05/02/2015  . Community acquired pneumonia 05/02/2015  . Small cell lung cancer ([place]) 04/20/2015  . Lung mass 03/24/2015  . Bilateral occipital neuralgia 03/20/2015  . DJD (degenerative joint disease) of knee 02/08/2015  . DDD (degenerative disc disease), lumbar 02/08/2015    Orientation ACTIVITIES/SOCIAL BLADDER RESPIRATION    Self, Time, Place, Situation  Active Continent O2 (As needed) (2 Liters oxygen )  BEHAVIORAL SYMPTOMS/MOOD NEUROLOGICAL BOWEL NUTRITION STATUS   (none )  (none ) Continent Diet (Diet: Soft )  PHYSICIAN VISITS COMMUNICATION OF NEEDS Height & Weight Skin  30 days Verbally '5\' 5"'$  (165.1 cm) 246 lbs. Normal          AMBULATORY STATUS RESPIRATION    Assist extensive O2 (As needed) (2 Liters oxygen )      Personal Care Assistance Level of Assistance  Bathing, Feeding, Dressing Bathing Assistance: Limited assistance Feeding assistance: Independent Dressing Assistance: Limited assistance      Functional Limitations Info  Sight, Hearing, Speech  Sight Info: Adequate Hearing Info: Adequate Speech Info: Adequate       SPECIAL CARE FACTORS FREQUENCY  PT (By licensed PT)     PT Frequency:  (5)             Additional Factors Info  Code Status, Allergies, Psychotropic, Insulin Sliding Scale, Isolation Precautions Code Status Info:  (Full Code. ) Allergies Info:  (Peanut Oil, Penicillins. ) Psychotropic Info:  (Ativan PRN) Insulin Sliding Scale Info:  (Novolog insulin injections 3 times daily. ) Isolation Precautions Info:  (Neutropenic Precautions)     Current Medications (05/06/2015): Current Facility-Administered Medications  Medication Dose Route Frequency Provider Last Rate Last Dose  . acetaminophen (TYLENOL) tablet 650 mg  650 mg Oral Q6H PRN Theodoro Grist, MD   650 mg at 05/03/15 1500   Or  . acetaminophen (TYLENOL) suppository 650 mg  650 mg Rectal Q6H PRN Theodoro Grist, MD      . atorvastatin (LIPITOR) tablet 40 mg  40 mg Oral Daily Theodoro Grist, MD   40 mg at 05/06/15 0828  . benzonatate (TESSALON) capsule 200 mg  200 mg Oral TID PRN Theodoro Grist, MD   200 mg at 05/05/15 2120  . budesonide-formoterol (SYMBICORT) 160-4.5 MCG/ACT inhaler 2 puff  2 puff Inhalation BID Theodoro Grist, MD   2 puff at 05/06/15 0828  . buPROPion (WELLBUTRIN SR) 12 hr tablet 100 mg  100 mg Oral Daily Theodoro Grist, MD   100 mg at 05/06/15 0828  . calcium carbonate (TUMS - dosed in mg elemental calcium) chewable tablet 200 mg of elemental calcium  1 tablet Oral TID WC Harrie Foreman, MD   200 mg of elemental calcium at 05/06/15 0828  . cefTAZidime (FORTAZ) 2 g in dextrose 5 % 50 mL IVPB  2 g Intravenous Q24H Crystal G Scarpena, RPH   2 g at 05/06/15 0530  . citalopram (CELEXA) tablet 40 mg  40 mg Oral q morning - 10a Theodoro Grist, MD   40 mg at 05/06/15 0829  . diltiazem (CARDIZEM) tablet 30 mg  30 mg Oral 4 times per day Vaughan Basta, MD   30 mg at 05/06/15 0529  . enoxaparin (LOVENOX) injection 40 mg  40 mg Subcutaneous Q24H  Crystal G Scarpena, RPH   40 mg at 05/06/15 0826  . feeding supplement (BOOST / RESOURCE BREEZE) liquid 1 Container  1 Container Oral TID WC Vaughan Basta, MD   1 Container at 05/06/15 0831  . filgrastim (NEUPOGEN) injection 300 mcg  300 mcg Subcutaneous Daily Nickolas Huger, MD   300 mcg at 05/06/15 3016  . fluticasone (FLONASE) 50 MCG/ACT nasal spray 1 spray  1 spray Each Nare Daily PRN Theodoro Grist, MD      . furosemide (LASIX) injection 20 mg  20 mg Intravenous BID Demetrios Loll, MD   20 mg at 05/06/15 1023  . gabapentin (NEURONTIN) capsule 300 mg  300 mg Oral QHS Theodoro Grist, MD   300 mg at 05/05/15 2120  . HYDROcodone-acetaminophen (NORCO/VICODIN) 5-325 MG per tablet 1-2 tablet  1-2 tablet Oral Q6H PRN Theodoro Grist, MD   1 tablet at 05/04/15 2034  . insulin aspart (novoLOG) injection 0-9 Units  0-9 Units Subcutaneous TID WC Theodoro Grist, MD   1 Units at 05/06/15 0830  . insulin aspart (novoLOG) injection 3 Units  3 Units Subcutaneous TID WC Theodoro Grist, MD   3 Units at 05/06/15 0831  . insulin detemir (LEVEMIR) injection 35 Units  35 Units Subcutaneous QHS Theodoro Grist, MD   35 Units at 05/05/15 2259  . levalbuterol (XOPENEX) nebulizer solution 1.25 mg  1.25 mg Nebulization Q4H Demetrios Loll, MD   1.25 mg at 05/06/15 0823  . loratadine (CLARITIN) tablet 10 mg  10 mg Oral Daily Theodoro Grist, MD   10 mg at 05/06/15 0828  . LORazepam (ATIVAN) tablet 1 mg  1 mg Oral Q8H PRN Theodoro Grist, MD   1 mg at 05/04/15 1637  . metoprolol tartrate (LOPRESSOR) tablet 12.5 mg  12.5 mg Oral BID Vaughan Basta, MD   12.5 mg at 05/06/15 0828  . morphine 2 MG/ML injection 2 mg  2 mg Intravenous Q3H PRN Theodoro Grist, MD   2 mg at 05/04/15 2342  . ondansetron (ZOFRAN) injection 4 mg  4 mg Intravenous Q6H PRN Vaughan Basta, MD   4 mg at 05/06/15 0325  . pantoprazole (PROTONIX) EC tablet 40 mg  40 mg Oral Daily Theodoro Grist, MD   40 mg at 05/06/15 0828  . polyvinyl alcohol (LIQUIFILM  TEARS) 1.4 % ophthalmic solution 1 drop  1 drop Both Eyes PRN Theodoro Grist, MD      . prochlorperazine (COMPAZINE) tablet 10 mg  10 mg Oral Q6H PRN Theodoro Grist, MD   10 mg at 05/06/15 0658  . promethazine (PHENERGAN) tablet 25 mg  25 mg Oral Q6H PRN Theodoro Grist, MD   25 mg at 05/06/15 0534  . sodium chloride 0.9 % injection 3 mL  3 mL Intravenous Q12H Theodoro Grist, MD   3 mL at 05/06/15 0831  . tiotropium (SPIRIVA) inhalation capsule  18 mcg  1 capsule Inhalation Daily Theodoro Grist, MD   18 mcg at 05/06/15 0828  . traZODone (DESYREL) tablet 50 mg  50 mg Oral QHS Theodoro Grist, MD   50 mg at 05/05/15 2120   Do not use this list as official medication orders. Please verify with discharge summary.  Discharge Medications:   Medication List    ASK your doctor about these medications        albuterol (2.5 MG/3ML) 0.083% nebulizer solution  Commonly known as:  PROVENTIL  Take 2.5 mg by nebulization every 2 (two) hours as needed for wheezing or shortness of breath.     ARTIFICIAL TEARS 0.1-0.3 % Soln  Place 1 drop into both eyes as needed (dry eyes).     atorvastatin 40 MG tablet  Commonly known as:  LIPITOR  Take 40 mg by mouth daily.     budesonide-formoterol 160-4.5 MCG/ACT inhaler  Commonly known as:  SYMBICORT  Inhale 2 puffs into the lungs 2 (two) times daily.     buPROPion 100 MG 12 hr tablet  Commonly known as:  WELLBUTRIN SR  Take 100 mg by mouth daily.     citalopram 40 MG tablet  Commonly known as:  CELEXA  Take 40 mg by mouth every morning.     FLONASE 50 MCG/ACT nasal spray  Generic drug:  fluticasone  Place 1 spray into the nose daily as needed for allergies or rhinitis.     furosemide 40 MG tablet  Commonly known as:  LASIX  Take 1 tablet (40 mg total) by mouth daily.     gabapentin 300 MG capsule  Commonly known as:  NEURONTIN  Take 300 mg by mouth at bedtime.     HYDROcodone-acetaminophen 5-325 MG tablet  Commonly known as:  NORCO/VICODIN  Take 1-2  tablets by mouth every 6 (six) hours as needed for moderate pain or severe pain.     insulin detemir 100 UNIT/ML injection  Commonly known as:  LEVEMIR  Inject 35 Units into the skin at bedtime.     isosorbide mononitrate 30 MG 24 hr tablet  Commonly known as:  IMDUR  Take 30 mg by mouth daily.     lidocaine-prilocaine cream  Commonly known as:  EMLA  Apply to affected area once     lisinopril 10 MG tablet  Commonly known as:  PRINIVIL,ZESTRIL  Take 10 mg by mouth daily.     loratadine 10 MG tablet  Commonly known as:  CLARITIN  Take 10 mg by mouth daily.     LORazepam 1 MG tablet  Commonly known as:  ATIVAN  Take 1 mg by mouth every 8 (eight) hours as needed for anxiety.     metFORMIN 1000 MG tablet  Commonly known as:  GLUCOPHAGE  Take 1,000 mg by mouth 2 (two) times daily with a meal.     metoprolol tartrate 25 MG tablet  Commonly known as:  LOPRESSOR  Take 25 mg by mouth 2 (two) times daily.     omeprazole 20 MG capsule  Commonly known as:  PRILOSEC  Take 20 mg by mouth 2 (two) times daily before a meal.     prochlorperazine 10 MG tablet  Commonly known as:  COMPAZINE  Take 1 tablet (10 mg total) by mouth every 6 (six) hours as needed (Nausea or vomiting).     promethazine 25 MG tablet  Commonly known as:  PHENERGAN  Take 25 mg by mouth every 6 (six) hours as needed  for nausea or vomiting.     SPIRIVA HANDIHALER 18 MCG inhalation capsule  Generic drug:  tiotropium  Place 1 capsule into inhaler and inhale daily.     traZODone 50 MG tablet  Commonly known as:  DESYREL  Take 1 tablet (50 mg total) by mouth at bedtime.        Relevant Imaging Results:  Relevant Lab Results:  Recent Labs    Additional Information  (SSN: [id number])  Loralyn Freshwater, LCSW

## 2015-05-06 NOTE — Consult Note (Signed)
[place]  [phone] [phone]) [phone]  ID: Sherry Prince OB: [birth year]  MR#: [id number]  JTT#:[id number]  Patient Care Team: Theotis Burrow, MD as PCP - General (Family Medicine)  CHIEF COMPLAINT:  Chief Complaint  Patient presents with  . Emesis    ems pt from home pt has history of Lung CA recent chemo and radiation has been vomiting since yesterday taking phenergan with no improvment    INTERVAL HISTORY: Patient is a 69 year old female with history of stage IV breast cancer who recently started chemotherapy with cisplatin and etoposide for stage IV small cell lung cancer. She was admitted to the hospital with increasing weakness and fatigue, nausea/ vomiting, and now neutropenic fever over the past 24-48 hours. She has no neurologic complaints. She denies any chest pain or shortness of breath. She has a poor appetite. She denies any pain today. Reports having several episodes of vomiting last night. Patient feels generally terrible, but offers no further specific complaints.  REVIEW OF SYSTEMS:   Review of Systems  Constitutional: Positive for fever and malaise/fatigue.  Respiratory: Negative.  Negative for shortness of breath.   Cardiovascular: Negative.  Negative for chest pain.  Gastrointestinal: Positive for nausea and vomiting.  Musculoskeletal: Negative.   Neurological: Positive for weakness.    As per HPI. Otherwise, a complete review of systems is negatve.  PAST MEDICAL HISTORY: Past Medical History  Diagnosis Date  . Diabetes mellitus without complication ([place])   . Chronic kidney disease   . GERD (gastroesophageal reflux disease)   . Hypertension   . Ear pain   . Serum calcium elevated   . Floaters   . Sleep apnea   . Cancer ([place])   . Liver cancer ([place])   . Cervical cancer ([place])   . Bone cancer ([place])   . CHF (congestive heart failure) ([place])   . COPD (chronic obstructive pulmonary disease) ([place])     PAST SURGICAL  HISTORY: Past Surgical History  Procedure Laterality Date  . Abdominal hysterectomy    . Breast surgery    . Eye surgery    . Endobronchial ultrasound N/A 03/29/2015    Procedure: ENDOBRONCHIAL ULTRASOUND;  Surgeon: Flora Lipps, MD;  Location: [facility] ORS;  Service: Cardiopulmonary;  Laterality: N/A;    FAMILY HISTORY Family History  Problem Relation Age of Onset  . Cancer Father        ADVANCED DIRECTIVES:    HEALTH MAINTENANCE: Social History  Substance Use Topics  . Smoking status: Former Research scientist (life sciences)  . Smokeless tobacco: None  . Alcohol Use: No     Colonoscopy:  PAP:  Bone density:  Lipid panel:  Allergies  Allergen Reactions  . Peanut Oil Anaphylaxis  . Penicillins Hives    Current Facility-Administered Medications  Medication Dose Route Frequency Provider Last Rate Last Dose  . acetaminophen (TYLENOL) tablet 650 mg  650 mg Oral Q6H PRN Theodoro Grist, MD   650 mg at 05/03/15 1500   Or  . acetaminophen (TYLENOL) suppository 650 mg  650 mg Rectal Q6H PRN Theodoro Grist, MD      . atorvastatin (LIPITOR) tablet 40 mg  40 mg Oral Daily Theodoro Grist, MD   40 mg at 05/06/15 0828  . benzonatate (TESSALON) capsule 200 mg  200 mg Oral TID PRN Theodoro Grist, MD   200 mg at 05/05/15 2120  . budesonide-formoterol (SYMBICORT) 160-4.5 MCG/ACT inhaler 2 puff  2 puff Inhalation BID Theodoro Grist, MD   2 puff at 05/06/15 0828  .  buPROPion (WELLBUTRIN SR) 12 hr tablet 100 mg  100 mg Oral Daily Theodoro Grist, MD   100 mg at 05/06/15 0828  . calcium carbonate (TUMS - dosed in mg elemental calcium) chewable tablet 200 mg of elemental calcium  1 tablet Oral TID WC Harrie Foreman, MD   200 mg of elemental calcium at 05/06/15 0828  . cefTAZidime (FORTAZ) 2 g in dextrose 5 % 50 mL IVPB  2 g Intravenous Q24H Crystal G Scarpena, RPH   2 g at 05/06/15 0530  . citalopram (CELEXA) tablet 40 mg  40 mg Oral q morning - 10a Theodoro Grist, MD   40 mg at 05/06/15 0829  . diltiazem (CARDIZEM) tablet 30  mg  30 mg Oral 4 times per day Vaughan Basta, MD   30 mg at 05/06/15 0529  . enoxaparin (LOVENOX) injection 40 mg  40 mg Subcutaneous Q24H Crystal G Scarpena, RPH   40 mg at 05/06/15 0826  . feeding supplement (BOOST / RESOURCE BREEZE) liquid 1 Container  1 Container Oral TID WC Vaughan Basta, MD   1 Container at 05/06/15 0831  . filgrastim (NEUPOGEN) injection 300 mcg  300 mcg Subcutaneous Daily Macmillan Huger, MD   300 mcg at 05/06/15 9562  . fluticasone (FLONASE) 50 MCG/ACT nasal spray 1 spray  1 spray Each Nare Daily PRN Theodoro Grist, MD      . furosemide (LASIX) injection 20 mg  20 mg Intravenous BID Demetrios Loll, MD   20 mg at 05/06/15 1023  . gabapentin (NEURONTIN) capsule 300 mg  300 mg Oral QHS Theodoro Grist, MD   300 mg at 05/05/15 2120  . HYDROcodone-acetaminophen (NORCO/VICODIN) 5-325 MG per tablet 1-2 tablet  1-2 tablet Oral Q6H PRN Theodoro Grist, MD   1 tablet at 05/04/15 2034  . insulin aspart (novoLOG) injection 0-9 Units  0-9 Units Subcutaneous TID WC Theodoro Grist, MD   1 Units at 05/06/15 0830  . insulin aspart (novoLOG) injection 3 Units  3 Units Subcutaneous TID WC Theodoro Grist, MD   3 Units at 05/06/15 0831  . insulin detemir (LEVEMIR) injection 35 Units  35 Units Subcutaneous QHS Theodoro Grist, MD   35 Units at 05/05/15 2259  . levalbuterol (XOPENEX) nebulizer solution 1.25 mg  1.25 mg Nebulization Q4H Demetrios Loll, MD   1.25 mg at 05/06/15 0823  . loratadine (CLARITIN) tablet 10 mg  10 mg Oral Daily Theodoro Grist, MD   10 mg at 05/06/15 0828  . LORazepam (ATIVAN) tablet 1 mg  1 mg Oral Q8H PRN Theodoro Grist, MD   1 mg at 05/04/15 1637  . metoprolol tartrate (LOPRESSOR) tablet 12.5 mg  12.5 mg Oral BID Vaughan Basta, MD   12.5 mg at 05/06/15 0828  . morphine 2 MG/ML injection 2 mg  2 mg Intravenous Q3H PRN Theodoro Grist, MD   2 mg at 05/04/15 2342  . ondansetron (ZOFRAN) injection 4 mg  4 mg Intravenous Q6H PRN Vaughan Basta, MD   4 mg at  05/06/15 0325  . pantoprazole (PROTONIX) EC tablet 40 mg  40 mg Oral Daily Theodoro Grist, MD   40 mg at 05/06/15 0828  . polyvinyl alcohol (LIQUIFILM TEARS) 1.4 % ophthalmic solution 1 drop  1 drop Both Eyes PRN Theodoro Grist, MD      . prochlorperazine (COMPAZINE) tablet 10 mg  10 mg Oral Q6H PRN Theodoro Grist, MD   10 mg at 05/06/15 0658  . promethazine (PHENERGAN) tablet 25 mg  25 mg Oral  Q6H PRN Theodoro Grist, MD   25 mg at 05/06/15 0534  . sodium chloride 0.9 % injection 3 mL  3 mL Intravenous Q12H Theodoro Grist, MD   3 mL at 05/06/15 0831  . tiotropium (SPIRIVA) inhalation capsule 18 mcg  1 capsule Inhalation Daily Theodoro Grist, MD   18 mcg at 05/06/15 0828  . traZODone (DESYREL) tablet 50 mg  50 mg Oral QHS Theodoro Grist, MD   50 mg at 05/05/15 2120    OBJECTIVE: Filed Vitals:   05/06/15 0827  BP: 117/84  Pulse: 110  Temp: 98.2 F (36.8 C)  Resp:      Body mass index is 40.94 kg/(m^2).    ECOG FS:3 - Symptomatic, >50% confined to bed  General: Ill-appearing, no acute distress. Eyes: Pink conjunctiva, anicteric sclera. HEENT: Normocephalic, moist mucous membranes, clear oropharnyx. Lungs: Clear to auscultation bilaterally. Heart: Regular rate and rhythm. No rubs, murmurs, or gallops. Abdomen: Soft, nontender, nondistended. No organomegaly noted, normoactive bowel sounds. Musculoskeletal: No edema, cyanosis, or clubbing. Neuro: Alert, answering all questions appropriately. Cranial nerves grossly intact. Skin: No rashes or petechiae noted. Psych: Normal affect.   LAB RESULTS:  Lab Results  Component Value Date   NA 131* 05/06/2015   K 3.4* 05/06/2015   CL 106 05/06/2015   CO2 14* 05/06/2015   GLUCOSE 154* 05/06/2015   BUN 41* 05/06/2015   CREATININE 2.50* 05/06/2015   CALCIUM 7.0* 05/06/2015   PROT 6.6 05/02/2015   ALBUMIN 3.1* 05/02/2015   AST 21 05/02/2015   ALT 25 05/02/2015   ALKPHOS 183* 05/02/2015   BILITOT 0.8 05/02/2015   GFRNONAA 19* 05/06/2015   GFRAA  21* 05/06/2015    Lab Results  Component Value Date   WBC 0.8* 05/06/2015   NEUTROABS 3.7 04/30/2015   HGB 10.0* 05/06/2015   HCT 30.3* 05/06/2015   MCV 87.8 05/06/2015   PLT 78* 05/06/2015     STUDIES: Dg Chest 2 View  05/04/2015  CLINICAL DATA:  Fever, nausea, shortness of Breath EXAM: CHEST  2 VIEW COMPARISON:  05/03/2015 FINDINGS: Cardiomediastinal silhouette is stable. Right IJ Port-A-Cath is unchanged in position. No acute infiltrate or pleural effusion. No pulmonary edema osteopenia and mild degenerative changes thoracic spine. Left upper lobe lesion again noted IMPRESSION: No active cardiopulmonary disease. Again noted left upper lobe lesion. Stable right IJ Port-A-Cath position. Electronically Signed   By: Lahoma Crocker M.D.   On: 05/04/2015 12:07   Dg Abd 1 View  05/02/2015  CLINICAL DATA:  Vomiting and diarrhea. Weakness. Cough. Currently undergoing chemotherapy for metastatic lung cancer. EXAM: ABDOMEN - 1 VIEW COMPARISON:  Radiographs dated 01/17/2007 and PET-CT dated 04/10/2015 FINDINGS: The bowel gas pattern is normal. No visible free air or free fluid. No acute osseous abnormalities. IMPRESSION: Benign-appearing abdomen. Electronically Signed   By: Lorriane Shire M.D.   On: 05/02/2015 10:58   Mr Jeri Cos WC Contrast  04/11/2015  CLINICAL DATA:  Initial staging of recently diagnosed left-sided small-cell lung cancer. EXAM: MRI HEAD WITHOUT AND WITH CONTRAST TECHNIQUE: Multiplanar, multiecho pulse sequences of the brain and surrounding structures were obtained without and with intravenous contrast. CONTRAST:  65m MULTIHANCE GADOBENATE DIMEGLUMINE 529 MG/ML IV SOLN COMPARISON:  None. FINDINGS: There is no evidence of acute infarct, intracranial hemorrhage, mass, midline shift, or extra-axial fluid collection. There is moderate generalized cerebral atrophy. Small foci of T2 hyperintensity in the subcortical and deep cerebral white matter and pons are nonspecific but compatible with  mild chronic small vessel ischemic  disease. An incidental, small left parietal developmental venous anomaly is noted. No abnormal enhancement is identified elsewhere. Prior bilateral cataract extraction is noted. There is at most trace mastoid air cell fluid bilaterally. No significant inflammatory disease is seen in the paranasal sinuses. Major intracranial vascular flow voids are preserved. No suspicious skull lesions are identified. IMPRESSION: 1. No evidence of intracranial metastases. 2. Mild chronic small vessel ischemic disease and moderate cerebral atrophy. Electronically Signed   By: Logan Bores M.D.   On: 04/11/2015 10:42   Nm Pet Image Initial (pi) Skull Base To Thigh  04/10/2015  CLINICAL DATA:  Initial treatment strategy for small cell lung cancer. EXAM: NUCLEAR MEDICINE PET SKULL BASE TO THIGH TECHNIQUE: 11.4 mCi F-18 FDG was injected intravenously. Full-ring PET imaging was performed from the skull base to thigh after the radiotracer. CT data was obtained and used for attenuation correction and anatomic localization. FASTING BLOOD GLUCOSE:  Value: 124 mg/dl COMPARISON:  CT 03/24/2015 FINDINGS: NECK LEFT level 3 lymph node measures 11 mm adjacent to the thyroid cartilage with SUV max equal 8.2 (image 40 fused data set). CHEST Hypermetabolic nodule measuring 2.8 cm (image 86, series 4) in the LEFT upper lobe with SUV max 15.5. There is a nodule more superiorly measuring 13 mm without associated metabolic activity which likely represents small focus of the post biopsy hemorrhage. No pleural fluid in the LEFT hemi thorax. There are hypermetabolic mediastinal lymph nodes including a subcarinal lymph node measuring 23 mm short axis with SUV max 12.2. Contralateral hypermetabolic RIGHT paratracheal lymph node on image 71 of the fused data set. There is hypermetabolic mass in the LEFT hilum measuring 3.3 cm. ABDOMEN/PELVIS There are multiple foci of hypermetabolic activity within the LEFT and RIGHT  hepatic lobe. These correspond to low-density lesions on comparison CT consistent widespread hepatic metastasis. Individual lesions have intense metabolic activity with SUV max equal 12.0. There are approximately 15 lesions within each hepatic lobe. No abnormal metabolic activity adrenal glands. Spleen is normal. No hypermetabolic abdominal or pelvic lymph nodes. SKELETON No focal hypermetabolic activity to suggest skeletal metastasis. IMPRESSION: 1. Hypermetabolic mass in LEFT upper lobe consists with primary bronchogenic carcinoma. 2. Extensive metastatic hypermetabolic adenopathy including LEFT hilum, subcarinal, contralateral paratracheal, and LEFT level III neck lymph node. 3. Multiple foci of hypermetabolic HEPATIC METASTASIS. 4. Small focus of post biopsy hemorrhage in the LEFT upper lobe. Electronically Signed   By: Suzy Bouchard M.D.   On: 04/10/2015 12:30   Dg Chest Port 1 View  05/03/2015  CLINICAL DATA:  Cough. EXAM: PORTABLE CHEST 1 VIEW COMPARISON:  03/23/2015 FINDINGS: There is a right jugular Port-A-Cath with tip at the cavoatrial junction. The left upper lobe mass is faintly visible. New no confluent airspace consolidation. No large effusion. Pulmonary vasculature is normal. IMPRESSION: Faint visibility of the left upper lobe mass. No acute findings are evident Electronically Signed   By: Andreas Newport M.D.   On: 05/03/2015 06:31    ASSESSMENT:  Stage IV small cell carcinoma lung, neutropenic fever.  Intractable nausea and vomiting.  PLAN:    1. Lung cancer: Patient received cycle 1 of cisplatin and etoposide last week. Her next treatment is in approximately 2 weeks, but will consider delaying if acute symptoms do not resolve. 2. Pancytopenia: Secondary to chemotherapy. Continue to monitor and transfuse if hemoglobin falls below 7.0. Will initiate daily Neupogen until her [place] is greater than 1000. 3. Neutropenic fever: Neupogen as above. Blood cultures negative to date. Agree  with  current antibiotics. 4. Intractable nausea and vomiting: Secondary to recent chemotherapy. Continue current antiemetics and IV fluids as ordered.  Will follow.  Evlyn Kanner, NP   05/06/2015 10:59 AM

## 2015-05-06 NOTE — Progress Notes (Signed)
cpap held. Pt vomiting.

## 2015-05-06 NOTE — Clinical Social Work Note (Signed)
Clinical Social Work Assessment  Patient Details  Name: Sherry Prince MRN: [id number] Date of Birth: 08/04/1945  Date of referral:  05/06/15               Reason for consult:  Facility Placement                Permission sought to share information with:  Chartered certified accountant granted to share information::  Yes, Verbal Permission Granted  Name::      [place]::   [place]  Relationship::     Contact Information:     Housing/Transportation Living arrangements for the past 2 months:  [place] of Information:  Patient Patient Interpreter Needed:  None Criminal Activity/Legal Involvement Pertinent to Current Situation/Hospitalization:  No - Comment as needed Significant Relationships:  Adult Children, Other Family Members Lives with:  Self Do you feel safe going back to the place where you live?  Yes Need for family participation in patient care:  Yes (Comment)  Care giving concerns: Patient lives alone in [place]. Her son Milbert Coulter and daughter in law Caren Griffins live beside her.    Social Worker assessment / plan: [place] representative (CSW) received SNF consult. PT is recommending SNF. CSW met with patient alone at bedside to discuss D/C plan. Patient was sitting up in bed and was alert and oriented. CSW introduced self and explained role of CSW department. Patient reported that she lives in [place] and her son, daughter in law and 2 grandchildren live beside her. CSW explained that PT is recommending short term rehab. Patient is agreeable to SNF search in [place] and [place]. Patient prefers a SNF in [place]. Patient was concerned about Co-pays. CSW explained that Medicare [facility] plans are all different and patient may have a co-pay starting day 1. Patient verbalized her understanding.   FL2 complete and faxed out. CSW will continue to follow and assist as needed.   Employment status:   Disabled (Comment on whether or not currently receiving Disability), Retired Nurse, adult PT Recommendations:  [place] / Referral to community resources:  [place]  Patient/Family's Response to care:  Patient is agreeable to [facility].   Patient/Family's Understanding of and Emotional Response to Diagnosis, Current Treatment, and Prognosis: Patient was pleasant throughout assessment.   Emotional Assessment Appearance:  Appears stated age Attitude/Demeanor/Rapport:    Affect (typically observed):  Accepting, Adaptable, Pleasant Orientation:  Oriented to Self, Oriented to Place, Oriented to  Time, Oriented to Situation Alcohol / Substance use:  Not Applicable Psych involvement (Current and /or in the community):  No (Comment)  Discharge Needs  Concerns to be addressed:  Discharge Planning Concerns Readmission within the last 30 days:  No Current discharge risk:  Chronically ill, Dependent with Mobility Barriers to Discharge:  Continued Medical Work up   Loralyn Freshwater, LCSW 05/06/2015, 10:38 AM

## 2015-05-06 NOTE — Clinical Social Work Placement (Signed)
   CLINICAL SOCIAL WORK PLACEMENT  NOTE  Date:  05/06/2015  Patient Details  Name: Sherry Prince MRN: [id number] Date of Birth: 12-05-1945  Clinical Social Work is seeking post-discharge placement for this patient at the [place] level of care (*CSW will initial, date and re-position this form in  chart as items are completed):  Yes   Patient/family provided with [place] Work Department's list of facilities offering this level of care within the geographic area requested by the patient (or if unable, by the patient's family).  Yes   Patient/family informed of their freedom to choose among providers that offer the needed level of care, that participate in Medicare, Medicaid or managed care program needed by the patient, have an available bed and are willing to accept the patient.  Yes   Patient/family informed of [facility]'s ownership interest in [facility] and [facility], as well as of the fact that they are under no obligation to receive care at these facilities.  PASRR submitted to EDS on 05/06/15     PASRR number received on 05/06/15     Existing PASRR number confirmed on 05/06/15 (Patient had an existing ALF PASARR. CSW changed to SNF level PASARR today. )     FL2 transmitted to all facilities in geographic area requested by pt/family on 05/06/15     FL2 transmitted to all facilities within larger geographic area on       Patient informed that his/her managed care company has contracts with or will negotiate with certain facilities, including the following:            Patient/family informed of bed offers received.  Patient chooses bed at       Physician recommends and patient chooses bed at      Patient to be transferred to   on  .  Patient to be transferred to facility by       Patient family notified on   of transfer.  Name of family member notified:        PHYSICIAN Please sign FL2     Additional Comment:     _______________________________________________ Loralyn Freshwater, LCSW 05/06/2015, 10:37 AM

## 2015-05-07 ENCOUNTER — Ambulatory Visit: Payer: Medicare Other

## 2015-05-07 DIAGNOSIS — E860 Dehydration: Principal | ICD-10-CM

## 2015-05-07 LAB — BASIC METABOLIC PANEL
Anion gap: 12 (ref 5–15)
BUN: 50 mg/dL — AB (ref 6–20)
CHLORIDE: 109 mmol/L (ref 101–111)
CO2: 15 mmol/L — AB (ref 22–32)
CREATININE: 2.24 mg/dL — AB (ref 0.44–1.00)
Calcium: 7.7 mg/dL — ABNORMAL LOW (ref 8.9–10.3)
GFR calc Af Amer: 25 mL/min — ABNORMAL LOW (ref >60)
GFR calc non Af Amer: 21 mL/min — ABNORMAL LOW (ref >60)
Glucose, Bld: 103 mg/dL — ABNORMAL HIGH (ref 65–99)
POTASSIUM: 3.5 mmol/L (ref 3.5–5.1)
Sodium: 136 mmol/L (ref 135–145)

## 2015-05-07 LAB — EXPECTORATED SPUTUM ASSESSMENT W REFEX TO RESP CULTURE

## 2015-05-07 LAB — CULTURE, BLOOD (ROUTINE X 2)
CULTURE: NO GROWTH
Culture: NO GROWTH

## 2015-05-07 LAB — GLUCOSE, CAPILLARY
GLUCOSE-CAPILLARY: 157 mg/dL — AB (ref 65–99)
GLUCOSE-CAPILLARY: 181 mg/dL — AB (ref 65–99)
Glucose-Capillary: 130 mg/dL — ABNORMAL HIGH (ref 65–99)
Glucose-Capillary: 131 mg/dL — ABNORMAL HIGH (ref 65–99)

## 2015-05-07 LAB — EXPECTORATED SPUTUM ASSESSMENT W GRAM STAIN, RFLX TO RESP C

## 2015-05-07 MED ORDER — SODIUM CHLORIDE 0.9 % IV SOLN
Freq: Once | INTRAVENOUS | Status: AC
  Administered 2015-05-07: 14:00:00 via INTRAVENOUS

## 2015-05-07 MED ORDER — GLUCERNA SHAKE PO LIQD
237.0000 mL | Freq: Three times a day (TID) | ORAL | Status: DC
  Administered 2015-05-07: 237 mL via ORAL

## 2015-05-07 MED ORDER — PROMETHAZINE HCL 25 MG/ML IJ SOLN
12.5000 mg | Freq: Once | INTRAMUSCULAR | Status: AC
  Administered 2015-05-07: 02:00:00 12.5 mg via INTRAVENOUS
  Filled 2015-05-07: qty 1

## 2015-05-07 MED ORDER — SODIUM CHLORIDE 0.9 % IV SOLN
8.0000 mg | Freq: Three times a day (TID) | INTRAVENOUS | Status: DC
  Administered 2015-05-07 – 2015-05-09 (×6): 8 mg via INTRAVENOUS
  Filled 2015-05-07 (×9): qty 4

## 2015-05-07 MED ORDER — METOCLOPRAMIDE HCL 5 MG/ML IJ SOLN
10.0000 mg | Freq: Four times a day (QID) | INTRAMUSCULAR | Status: DC
  Administered 2015-05-07 – 2015-05-08 (×5): 10 mg via INTRAVENOUS
  Filled 2015-05-07 (×5): qty 2

## 2015-05-07 NOTE — Progress Notes (Signed)
PT Cancellation Note  Patient Details Name: LOREAN EKSTRAND MRN: [id number] DOB: 1945-08-19   Cancelled Treatment:    Reason Eval/Treat Not Completed: Other (comment) (Nursing requesting to hold PT d/t pt not having a good day (pt not feeling well today).)  Will re-attempt PT session at a later date/time.   Raquel Sarna Darion Juhasz 05/07/2015, 4:54 PM Leitha Bleak, [place]

## 2015-05-07 NOTE — Progress Notes (Signed)
[facility]  [phone] [phone]  ID: Sherry Prince OB: [birth year]  MR#: [id number]  SWN#:[id number]  Patient Care Team: Theotis Burrow, MD as PCP - General (Family Medicine)  CHIEF COMPLAINT:  Chief Complaint  Patient presents with  . Emesis    ems pt from home pt has history of Lung CA recent chemo and radiation has been vomiting since yesterday taking phenergan with no improvment    INTERVAL HISTORY: Patient still with nausea and vomiting and minimal PO intake. She continues to have increased weakness and fatigue. She no longer is having fevers and her blood counts are improving.  REVIEW OF SYSTEMS:   Review of Systems  Constitutional: Positive for malaise/fatigue. Negative for fever.  Respiratory: Negative.   Cardiovascular: Negative.   Gastrointestinal: Positive for nausea and vomiting.  Musculoskeletal: Negative.   Neurological: Positive for weakness.    As per HPI. Otherwise, a complete review of systems is negatve.  PAST MEDICAL HISTORY: Past Medical History  Diagnosis Date  . Diabetes mellitus without complication ([place])   . Chronic kidney disease   . GERD (gastroesophageal reflux disease)   . Hypertension   . Ear pain   . Serum calcium elevated   . Floaters   . Sleep apnea   . Cancer ([place])   . Liver cancer ([place])   . Cervical cancer ([place])   . Bone cancer ([place])   . CHF (congestive heart failure) ([place])   . COPD (chronic obstructive pulmonary disease) ([place])     PAST SURGICAL HISTORY: Past Surgical History  Procedure Laterality Date  . Abdominal hysterectomy    . Breast surgery    . Eye surgery    . Endobronchial ultrasound N/A 03/29/2015    Procedure: ENDOBRONCHIAL ULTRASOUND;  Surgeon: Flora Lipps, MD;  Location: [facility] ORS;  Service: Cardiopulmonary;  Laterality: N/A;    FAMILY HISTORY Family History  Problem Relation Age of Onset  . Cancer Father        ADVANCED DIRECTIVES:    HEALTH  MAINTENANCE: Social History  Substance Use Topics  . Smoking status: Former Research scientist (life sciences)  . Smokeless tobacco: None  . Alcohol Use: No     Colonoscopy:  PAP:  Bone density:  Lipid panel:  Allergies  Allergen Reactions  . Peanut Oil Anaphylaxis  . Penicillins Hives    Current Facility-Administered Medications  Medication Dose Route Frequency Provider Last Rate Last Dose  . acetaminophen (TYLENOL) tablet 650 mg  650 mg Oral Q6H PRN Theodoro Grist, MD   650 mg at 05/03/15 1500   Or  . acetaminophen (TYLENOL) suppository 650 mg  650 mg Rectal Q6H PRN Theodoro Grist, MD      . atorvastatin (LIPITOR) tablet 40 mg  40 mg Oral Daily Theodoro Grist, MD   40 mg at 05/06/15 0828  . benzonatate (TESSALON) capsule 200 mg  200 mg Oral TID PRN Theodoro Grist, MD   200 mg at 05/05/15 2120  . budesonide-formoterol (SYMBICORT) 160-4.5 MCG/ACT inhaler 2 puff  2 puff Inhalation BID Theodoro Grist, MD   2 puff at 05/07/15 0833  . buPROPion (WELLBUTRIN SR) 12 hr tablet 100 mg  100 mg Oral Daily Theodoro Grist, MD   100 mg at 05/06/15 0828  . calcium carbonate (TUMS - dosed in mg elemental calcium) chewable tablet 200 mg of elemental calcium  1 tablet Oral TID WC Harrie Foreman, MD   200 mg of elemental calcium at 05/06/15 1622  . cefTAZidime (FORTAZ) 2 g in  dextrose 5 % 50 mL IVPB  2 g Intravenous Q24H Loleta Dicker, RPH   2 g at 05/07/15 1740  . citalopram (CELEXA) tablet 40 mg  40 mg Oral q morning - 10a Theodoro Grist, MD   40 mg at 05/06/15 0829  . diltiazem (CARDIZEM) tablet 30 mg  30 mg Oral 4 times per day Vaughan Basta, MD   30 mg at 05/07/15 1145  . enoxaparin (LOVENOX) injection 40 mg  40 mg Subcutaneous Q24H Crystal G Scarpena, RPH   40 mg at 05/07/15 8144  . feeding supplement (GLUCERNA SHAKE) (GLUCERNA SHAKE) liquid 237 mL  237 mL Oral TID WC Vaughan Basta, MD      . filgrastim (NEUPOGEN) injection 300 mcg  300 mcg Subcutaneous Daily Bloodsworth Huger, MD   300 mcg at 05/07/15  1139  . fluticasone (FLONASE) 50 MCG/ACT nasal spray 1 spray  1 spray Each Nare Daily PRN Theodoro Grist, MD      . furosemide (LASIX) injection 20 mg  20 mg Intravenous BID Demetrios Loll, MD   20 mg at 05/07/15 1139  . gabapentin (NEURONTIN) capsule 300 mg  300 mg Oral QHS Theodoro Grist, MD   300 mg at 05/06/15 2052  . HYDROcodone-acetaminophen (NORCO/VICODIN) 5-325 MG per tablet 1-2 tablet  1-2 tablet Oral Q6H PRN Theodoro Grist, MD   1 tablet at 05/04/15 2034  . insulin aspart (novoLOG) injection 0-9 Units  0-9 Units Subcutaneous TID WC Theodoro Grist, MD   2 Units at 05/07/15 1249  . insulin aspart (novoLOG) injection 3 Units  3 Units Subcutaneous TID WC Theodoro Grist, MD   3 Units at 05/07/15 1304  . insulin detemir (LEVEMIR) injection 35 Units  35 Units Subcutaneous QHS Theodoro Grist, MD   35 Units at 05/05/15 2259  . levalbuterol (XOPENEX) nebulizer solution 1.25 mg  1.25 mg Nebulization QID Demetrios Loll, MD   1.25 mg at 05/07/15 1142  . levalbuterol (XOPENEX) nebulizer solution 1.25 mg  1.25 mg Nebulization Q4H PRN Demetrios Loll, MD      . loratadine (CLARITIN) tablet 10 mg  10 mg Oral Daily Theodoro Grist, MD   10 mg at 05/06/15 0828  . LORazepam (ATIVAN) tablet 1 mg  1 mg Oral Q8H PRN Theodoro Grist, MD   1 mg at 05/06/15 2056  . metoCLOPramide (REGLAN) injection 10 mg  10 mg Intravenous 4 times per day Boening Huger, MD   10 mg at 05/07/15 1249  . metoprolol tartrate (LOPRESSOR) tablet 12.5 mg  12.5 mg Oral BID Vaughan Basta, MD   12.5 mg at 05/07/15 1145  . morphine 2 MG/ML injection 2 mg  2 mg Intravenous Q3H PRN Theodoro Grist, MD   2 mg at 05/04/15 2342  . ondansetron (ZOFRAN) 8 mg in sodium chloride 0.9 % 50 mL IVPB  8 mg Intravenous 3 times per day Valentine Huger, MD      . pantoprazole (PROTONIX) EC tablet 40 mg  40 mg Oral Daily Theodoro Grist, MD   40 mg at 05/06/15 0828  . polyvinyl alcohol (LIQUIFILM TEARS) 1.4 % ophthalmic solution 1 drop  1 drop Both Eyes PRN Theodoro Grist, MD       . prochlorperazine (COMPAZINE) tablet 10 mg  10 mg Oral Q6H PRN Theodoro Grist, MD   10 mg at 05/07/15 0833  . promethazine (PHENERGAN) tablet 25 mg  25 mg Oral Q6H PRN Theodoro Grist, MD   25 mg at 05/06/15 0534  . sodium chloride 0.9 %  injection 3 mL  3 mL Intravenous Q12H Theodoro Grist, MD   5 mL at 05/07/15 1306  . tiotropium (SPIRIVA) inhalation capsule 18 mcg  1 capsule Inhalation Daily Theodoro Grist, MD   18 mcg at 05/06/15 0828  . traZODone (DESYREL) tablet 50 mg  50 mg Oral QHS Theodoro Grist, MD   50 mg at 05/06/15 2052    OBJECTIVE: Filed Vitals:   05/07/15 1320  BP: 82/53  Pulse: 127  Temp: 98.6 F (37 C)  Resp:      Body mass index is 40.94 kg/(m^2).    ECOG FS:2 - Symptomatic, <50% confined to bed  General: Ill-appearing, no acute distress. Eyes: Pink conjunctiva, anicteric sclera. Lungs: Clear to auscultation bilaterally. Heart: Regular rate and rhythm. No rubs, murmurs, or gallops. Abdomen: Soft, nontender, nondistended. No organomegaly noted, normoactive bowel sounds. Musculoskeletal: No edema, cyanosis, or clubbing. Neuro: Alert, answering all questions appropriately. Cranial nerves grossly intact. Skin: No rashes or petechiae noted. Psych: Normal affect.   LAB RESULTS:  Lab Results  Component Value Date   NA 136 05/07/2015   K 3.5 05/07/2015   CL 109 05/07/2015   CO2 15* 05/07/2015   GLUCOSE 103* 05/07/2015   BUN 50* 05/07/2015   CREATININE 2.24* 05/07/2015   CALCIUM 7.7* 05/07/2015   PROT 6.6 05/02/2015   ALBUMIN 3.1* 05/02/2015   AST 21 05/02/2015   ALT 25 05/02/2015   ALKPHOS 183* 05/02/2015   BILITOT 0.8 05/02/2015   GFRNONAA 21* 05/07/2015   GFRAA 25* 05/07/2015    Lab Results  Component Value Date   WBC 1.5* 05/06/2015   NEUTROABS 0.8* 05/06/2015   HGB 10.0* 05/06/2015   HCT 30.8* 05/06/2015   MCV 86.2 05/06/2015   PLT 87* 05/06/2015     STUDIES: Dg Chest 2 View  05/04/2015  CLINICAL DATA:  Fever, nausea, shortness of Breath  EXAM: CHEST  2 VIEW COMPARISON:  05/03/2015 FINDINGS: Cardiomediastinal silhouette is stable. Right IJ Port-A-Cath is unchanged in position. No acute infiltrate or pleural effusion. No pulmonary edema osteopenia and mild degenerative changes thoracic spine. Left upper lobe lesion again noted IMPRESSION: No active cardiopulmonary disease. Again noted left upper lobe lesion. Stable right IJ Port-A-Cath position. Electronically Signed   By: Lahoma Crocker M.D.   On: 05/04/2015 12:07   Dg Abd 1 View  05/02/2015  CLINICAL DATA:  Vomiting and diarrhea. Weakness. Cough. Currently undergoing chemotherapy for metastatic lung cancer. EXAM: ABDOMEN - 1 VIEW COMPARISON:  Radiographs dated 01/17/2007 and PET-CT dated 04/10/2015 FINDINGS: The bowel gas pattern is normal. No visible free air or free fluid. No acute osseous abnormalities. IMPRESSION: Benign-appearing abdomen. Electronically Signed   By: Lorriane Shire M.D.   On: 05/02/2015 10:58   Mr Jeri Cos UM Contrast  04/11/2015  CLINICAL DATA:  Initial staging of recently diagnosed left-sided small-cell lung cancer. EXAM: MRI HEAD WITHOUT AND WITH CONTRAST TECHNIQUE: Multiplanar, multiecho pulse sequences of the brain and surrounding structures were obtained without and with intravenous contrast. CONTRAST:  76m MULTIHANCE GADOBENATE DIMEGLUMINE 529 MG/ML IV SOLN COMPARISON:  None. FINDINGS: There is no evidence of acute infarct, intracranial hemorrhage, mass, midline shift, or extra-axial fluid collection. There is moderate generalized cerebral atrophy. Small foci of T2 hyperintensity in the subcortical and deep cerebral white matter and pons are nonspecific but compatible with mild chronic small vessel ischemic disease. An incidental, small left parietal developmental venous anomaly is noted. No abnormal enhancement is identified elsewhere. Prior bilateral cataract extraction is noted. There is at  most trace mastoid air cell fluid bilaterally. No significant inflammatory  disease is seen in the paranasal sinuses. Major intracranial vascular flow voids are preserved. No suspicious skull lesions are identified. IMPRESSION: 1. No evidence of intracranial metastases. 2. Mild chronic small vessel ischemic disease and moderate cerebral atrophy. Electronically Signed   By: Logan Bores M.D.   On: 04/11/2015 10:42   Nm Pet Image Initial (pi) Skull Base To Thigh  04/10/2015  CLINICAL DATA:  Initial treatment strategy for small cell lung cancer. EXAM: NUCLEAR MEDICINE PET SKULL BASE TO THIGH TECHNIQUE: 11.4 mCi F-18 FDG was injected intravenously. Full-ring PET imaging was performed from the skull base to thigh after the radiotracer. CT data was obtained and used for attenuation correction and anatomic localization. FASTING BLOOD GLUCOSE:  Value: 124 mg/dl COMPARISON:  CT 03/24/2015 FINDINGS: NECK LEFT level 3 lymph node measures 11 mm adjacent to the thyroid cartilage with SUV max equal 8.2 (image 40 fused data set). CHEST Hypermetabolic nodule measuring 2.8 cm (image 86, series 4) in the LEFT upper lobe with SUV max 15.5. There is a nodule more superiorly measuring 13 mm without associated metabolic activity which likely represents small focus of the post biopsy hemorrhage. No pleural fluid in the LEFT hemi thorax. There are hypermetabolic mediastinal lymph nodes including a subcarinal lymph node measuring 23 mm short axis with SUV max 12.2. Contralateral hypermetabolic RIGHT paratracheal lymph node on image 71 of the fused data set. There is hypermetabolic mass in the LEFT hilum measuring 3.3 cm. ABDOMEN/PELVIS There are multiple foci of hypermetabolic activity within the LEFT and RIGHT hepatic lobe. These correspond to low-density lesions on comparison CT consistent widespread hepatic metastasis. Individual lesions have intense metabolic activity with SUV max equal 12.0. There are approximately 15 lesions within each hepatic lobe. No abnormal metabolic activity adrenal glands. Spleen  is normal. No hypermetabolic abdominal or pelvic lymph nodes. SKELETON No focal hypermetabolic activity to suggest skeletal metastasis. IMPRESSION: 1. Hypermetabolic mass in LEFT upper lobe consists with primary bronchogenic carcinoma. 2. Extensive metastatic hypermetabolic adenopathy including LEFT hilum, subcarinal, contralateral paratracheal, and LEFT level III neck lymph node. 3. Multiple foci of hypermetabolic HEPATIC METASTASIS. 4. Small focus of post biopsy hemorrhage in the LEFT upper lobe. Electronically Signed   By: Suzy Bouchard M.D.   On: 04/10/2015 12:30   Dg Chest Port 1 View  05/03/2015  CLINICAL DATA:  Cough. EXAM: PORTABLE CHEST 1 VIEW COMPARISON:  03/23/2015 FINDINGS: There is a right jugular Port-A-Cath with tip at the cavoatrial junction. The left upper lobe mass is faintly visible. New no confluent airspace consolidation. No large effusion. Pulmonary vasculature is normal. IMPRESSION: Faint visibility of the left upper lobe mass. No acute findings are evident Electronically Signed   By: Andreas Newport M.D.   On: 05/03/2015 06:31    ASSESSMENT: Stage IV small cell carcinoma lung, neutropenic fever. Intractable nausea and vomiting  PLAN:    1. Lung cancer: Patient received cycle 1 of cisplatin and etoposide approximately 2 weeks ago. Her next treatment is in approximately 1 week, but will consider delaying if acute symptoms do not resolve. 2. Pancytopenia: Secondary to chemotherapy.  Improving. Continue to monitor and transfuse if hemoglobin falls below 7.0. Daily Neupogen until her [place] is greater than 1000. 3. Neutropenic fever: Resolved. Neupogen as above. Blood cultures negative to date. Agree with current antibiotics. 4. Intractable nausea and vomiting: Likely secondary to recent chemotherapy. Will scheduled Zofran and Reglan. If no improvement in the next 24-48 hours,  will consider GI consult. Continue IV fluids as ordered.  5. Renal failure: Multifactorial including  dehydration. IV fluids as above.   Will follow.  Quest Huger, MD   05/07/2015 1:23 PM

## 2015-05-07 NOTE — Progress Notes (Signed)
ANTIBIOTIC CONSULT NOTE - Follow Up  Pharmacy Consult for Ceftazidime/Renal adjustment Indication: Febrile neutropenia  Allergies  Allergen Reactions  . Peanut Oil Anaphylaxis  . Penicillins Hives    Patient Measurements: Height: '5\' 5"'$  (165.1 cm) Weight: 246 lb (111.585 kg) IBW/kg (Calculated) : 57 Adjusted Body Weight: 79 kg  Vital Signs: Temp: 98.3 F (36.8 C) (11/07 0509) Temp Source: Oral (11/07 0509) BP: 106/70 mmHg (11/07 0509) Pulse Rate: 119 (11/07 0509) Intake/Output from previous day: 11/06 0701 - 11/07 0700 In: -  Out: 1880 [Urine:300; Emesis/NG output:1580] Intake/Output from this shift: Total I/O In: 100 [IV Piggyback:100] Out: -   Labs:  Recent Labs  05/05/15 0452 05/06/15 0329 05/06/15 1624 05/07/15 0633  WBC 0.3* 0.8* 1.5*  --   HGB 9.3* 10.0* 10.0*  --   PLT 54* 78* 87*  --   CREATININE 2.42* 2.50*  --  2.24*   Estimated Creatinine Clearance: 29.5 mL/min (by C-G formula based on Cr of 2.24). No results for input(s): VANCOTROUGH, VANCOPEAK, VANCORANDOM, GENTTROUGH, GENTPEAK, GENTRANDOM, TOBRATROUGH, TOBRAPEAK, TOBRARND, AMIKACINPEAK, AMIKACINTROU, AMIKACIN in the last 72 hours.   Microbiology: Recent Results (from the past 720 hour(s))  Culture, blood (routine x 2)     Status: None (Preliminary result)   Collection Time: 05/02/15 10:45 AM  Result Value Ref Range Status   Specimen Description BLOOD RIGHT HAND  Final   Special Requests BOTTLES DRAWN AEROBIC AND ANAEROBIC 3CC  Final   Culture NO GROWTH 4 DAYS  Final   Report Status PENDING  Incomplete  Culture, blood (routine x 2)     Status: None (Preliminary result)   Collection Time: 05/02/15 11:03 AM  Result Value Ref Range Status   Specimen Description BLOOD RIGHT HAND  Final   Special Requests BOTTLES DRAWN AEROBIC AND ANAEROBIC 4CC  Final   Culture NO GROWTH 4 DAYS  Final   Report Status PENDING  Incomplete  Culture, blood (routine x 2)     Status: None (Preliminary result)    Collection Time: 05/03/15  4:47 AM  Result Value Ref Range Status   Specimen Description BLOOD LEFT ANTECUBITAL  Final   Special Requests BOTTLES DRAWN AEROBIC AND ANAEROBIC 10ML  Final   Culture NO GROWTH 3 DAYS  Final   Report Status PENDING  Incomplete  Culture, blood (routine x 2)     Status: None (Preliminary result)   Collection Time: 05/03/15  5:12 AM  Result Value Ref Range Status   Specimen Description BLOOD LEFT HAND  Final   Special Requests BOTTLES DRAWN AEROBIC AND ANAEROBIC 10ML  Final   Culture NO GROWTH 3 DAYS  Final   Report Status PENDING  Incomplete  Stool culture     Status: None   Collection Time: 05/03/15  4:05 PM  Result Value Ref Range Status   Specimen Description STOOL  Final   Special Requests Immunocompromised  Final   Culture   Final    NO SALMONELLA OR SHIGELLA ISOLATED NO CAMPYLOBACTER DETECTED No Pathogenic E. coli detected    Report Status 05/06/2015 FINAL  Final  C difficile quick scan w PCR reflex     Status: None   Collection Time: 05/03/15  4:05 PM  Result Value Ref Range Status   C Diff antigen NEGATIVE NEGATIVE Final   C Diff toxin NEGATIVE NEGATIVE Final   C Diff interpretation Negative for C. difficile  Final    Medical History: Past Medical History  Diagnosis Date  . Diabetes mellitus without complication ([place])   .  Chronic kidney disease   . GERD (gastroesophageal reflux disease)   . Hypertension   . Ear pain   . Serum calcium elevated   . Floaters   . Sleep apnea   . Cancer ([place])   . Liver cancer ([place])   . Cervical cancer ([place])   . Bone cancer ([place])   . CHF (congestive heart failure) ([place])   . COPD (chronic obstructive pulmonary disease) ([facility])     Medications:  Scheduled:  . atorvastatin  40 mg Oral Daily  . budesonide-formoterol  2 puff Inhalation BID  . buPROPion  100 mg Oral Daily  . calcium carbonate  1 tablet Oral TID WC  . cefTAZidime (FORTAZ)  IV  2 g Intravenous Q24H  . citalopram  40 mg Oral q morning - 10a   . diltiazem  30 mg Oral 4 times per day  . enoxaparin (LOVENOX) injection  40 mg Subcutaneous Q24H  . feeding supplement  1 Container Oral TID WC  . filgrastim  300 mcg Subcutaneous Daily  . furosemide  20 mg Intravenous BID  . gabapentin  300 mg Oral QHS  . insulin aspart  0-9 Units Subcutaneous TID WC  . insulin aspart  3 Units Subcutaneous TID WC  . insulin detemir  35 Units Subcutaneous QHS  . levalbuterol  1.25 mg Nebulization QID  . loratadine  10 mg Oral Daily  . metoprolol tartrate  12.5 mg Oral BID  . pantoprazole  40 mg Oral Daily  . sodium chloride  3 mL Intravenous Q12H  . tiotropium  1 capsule Inhalation Daily  . traZODone  50 mg Oral QHS   Assessment: Patient is a 69 yo female admitted with N/V status post chemo.  Ceftazidime therapy initiated for treatment of febrile neutropenia.   SCr 2.24, est CrCl~29.5 mL/min  Goal of Therapy:  Renal adjustment  Plan:  Will continue Ceftazidime 2 gm IV q24h dosing due to acute renal failure.  Per recommendations, may use 50% greater then suggested renal adjustment for severe infections.  Therefore, will continue 2 gm dose vs 1 gm.  Follow up culture results   Pharmacy will continue to follow.  Ardie Mclennan G 05/07/2015,8:03 AM

## 2015-05-07 NOTE — Care Management Important Message (Signed)
Important Message  Patient Details  Name: Sherry Prince MRN: [id number] Date of Birth: [birth year]   Medicare Important Message Given:  Yes-second notification given    Shelbie Ammons, RN 05/07/2015, 1:37 PM

## 2015-05-07 NOTE — Progress Notes (Signed)
SNF and Non-Emergent EMS Transport Benefits:  Number called: [phone] Rep:  Ubaldo Glassing Reference Number: [place] Medicare Complete HMO Plan Two active as of 06/30/14 with no deductible.  Out of pocket max is $6700, of which [phone] met so far.  In-network SNF: $0 copay for days 1-20, a $160 daily copay for days 21-62, and a $0 copay for days 63-100.  Once out of pocket is reached, patient covered at 100% for remainder of 100 day benefit period. Per rep, all 100 skilled days available at this time.  $0 copay for professional fees and 3 day hospital stay is not required.  Josem Kaufmann is required: [phone].    Non-emergent EMS transport: $250 copay for each one way medically necessary, Medicare covered trip.  Josem Kaufmann is required: [phone].

## 2015-05-07 NOTE — Clinical Documentation Improvement (Signed)
Internal Medicine Please update your documentation within the medical record to reflect your response to this query. Thank you Can the diagnosis of CHF be further specified?    Acuity - Acute, Chronic, Acute on Chronic   Type - Systolic, Diastolic, Systolic and Diastolic  Other  Clinically Undetermined  Document any associated diagnoses/conditions  Supporting Information: 05/06/15 progr note.Marland KitchenMarland Kitchen"History of chronic CHF. Restart Lasix but hold if blood pressure is normal.".Marland KitchenMarland Kitchen  Please exercise your independent, professional judgment when responding. A specific answer is not anticipated or expected.  Thank You,  Ermelinda Das, RN, BSN, [place] Certified Clinical Documentation Specialist [place]: Health Information Management [phone]

## 2015-05-07 NOTE — Progress Notes (Signed)
Nutrition Follow-up  DOCUMENTATION CODES:   Severe malnutrition in context of acute illness/injury  INTERVENTION:   Meals and Snacks: Cater to patient preferences, will send afternoon snack of peaches and jello in am Medical Food Supplement Therapy: will recommend Glucerna Shake po TID, each supplement provides 220 kcal and 10 grams of protein, and discontinue Boost Breeze on meal trays Coordination of Care: recommend new weight   NUTRITION DIAGNOSIS:   Malnutrition related to cancer and cancer related treatments as evidenced by energy intake < or equal to 50% for > or equal to 5 days, percent weight loss  GOAL:   Patient will meet greater than or equal to 90% of their needs; ongoing  MONITOR:    (Energy Intake, Electrolyte and renal Profile, Digestive System, Glucose Profile, Anthropometrics)  REASON FOR ASSESSMENT:   Consult Poor PO  ASSESSMENT:   Pt admitted with weakness, intractible n/v and neutropenic fevers secondary to chemotherapy. Pt with h/o stage IV breast cancer and small cell lung cancer. Pt remains with active n/v, per MD note KUB negative.  Diet Order:  DIET SOFT Room service appropriate?: Yes; Fluid consistency:: Thin    Current Nutrition: Pt reports taking sips of milk this am at bedside. Pt asking for popsicle on visit. Pt reports not tolerating po over the weekend. Recorded po intake bites and sips.    Gastrointestinal Profile: per Nsg documentation, pt with distended taut abdomen, hypoactive BS with emesis this am.  Last BM: 05/07/2015   Scheduled Medications:  . atorvastatin  40 mg Oral Daily  . budesonide-formoterol  2 puff Inhalation BID  . buPROPion  100 mg Oral Daily  . calcium carbonate  1 tablet Oral TID WC  . cefTAZidime (FORTAZ)  IV  2 g Intravenous Q24H  . citalopram  40 mg Oral q morning - 10a  . diltiazem  30 mg Oral 4 times per day  . enoxaparin (LOVENOX) injection  40 mg Subcutaneous Q24H  . feeding supplement (GLUCERNA SHAKE)   237 mL Oral TID WC  . filgrastim  300 mcg Subcutaneous Daily  . furosemide  20 mg Intravenous BID  . gabapentin  300 mg Oral QHS  . insulin aspart  0-9 Units Subcutaneous TID WC  . insulin aspart  3 Units Subcutaneous TID WC  . insulin detemir  35 Units Subcutaneous QHS  . levalbuterol  1.25 mg Nebulization QID  . loratadine  10 mg Oral Daily  . metoprolol tartrate  12.5 mg Oral BID  . pantoprazole  40 mg Oral Daily  . sodium chloride  3 mL Intravenous Q12H  . tiotropium  1 capsule Inhalation Daily  . traZODone  50 mg Oral QHS    Electrolyte/Renal Profile and Glucose Profile:   Recent Labs Lab 05/04/15 0930 05/05/15 0452 05/06/15 0329 05/07/15 0633  NA 136 138 131* 136  K 3.7 4.1 3.4* 3.5  CL 110 112* 106 109  CO2 18* 16* 14* 15*  BUN 36* 35* 41* 50*  CREATININE 2.84* 2.42* 2.50* 2.24*  CALCIUM 6.1* 6.7* 7.0* 7.7*  MG 0.6* 1.1* 2.2  --   GLUCOSE 154* 114* 154* 103*   Protein Profile:  Recent Labs Lab 05/02/15 0531  ALBUMIN 3.1*     Weight Trend since Admission: Filed Weights   05/02/15 0524  Weight: 246 lb (111.585 kg)     Skin:  Reviewed, no issues   BMI:  Body mass index is 40.94 kg/(m^2).  Estimated Nutritional Needs:   Kcal:  using IBW of 56.8kg, BEE:  1093kcals, TEE: (IF 1.1-1.3)(AF 1.2) 1444-1706kcals  Protein:  62-74g protein (1.1-1.3g/kg)   Fluid:  1420-1777m of fluid (25-369mkg)  EDUCATION NEEDS:   No education needs identified at this time   HIVelmaRD, LDN Pager ([phone]

## 2015-05-07 NOTE — Progress Notes (Signed)
[facility] NAME: Caiya Bettes    MR#:  [id number]  DATE OF BIRTH:  1945-08-09  SUBJECTIVE:  CHIEF COMPLAINT:   Chief Complaint  Patient presents with  . Emesis    ems pt from home pt has history of Lung CA recent chemo and radiation has been vomiting since yesterday taking phenergan with no improvment   A large mount of vomiting last night and once just now.  No Wheezing or SOB.  on O2 [place] 3 L. BP is low at 82/53 just now.  REVIEW OF SYSTEMS:  CONSTITUTIONAL: No fever, positive for generalized weakness.  EYES: No blurred or double vision.  EARS, NOSE, AND THROAT: No tinnitus or ear pain.  RESPIRATORY: No cough, shortness of breath, wheezing or hemoptysis.  CARDIOVASCULAR: No chest pain, orthopnea, edema.  GASTROINTESTINAL: Has nausea,vomiting and diarrhea, but no abdominal pain. GENITOURINARY: No dysuria, hematuria.  ENDOCRINE: No polyuria, nocturia,  HEMATOLOGY: No anemia, easy bruising or bleeding SKIN: No rash or lesion.  MUSCULOSKELETAL: No joint pain or arthritis.   NEUROLOGIC: No tingling, numbness, weakness.  PSYCHIATRY: No anxiety or depression.   ROS  DRUG ALLERGIES:   Allergies  Allergen Reactions  . Peanut Oil Anaphylaxis  . Penicillins Hives    VITALS:  Blood pressure 119/102, pulse 100, temperature 98.6 F (37 C), temperature source Oral, resp. rate 18, height '5\' 5"'$  (1.651 m), weight 111.585 kg (246 lb), SpO2 100 %.  PHYSICAL EXAMINATION:  GENERAL:  69 y.o.-year-old patient lying in the bed with no acute distress. Obese. EYES: Pupils equal, round, reactive to light and accommodation. No scleral icterus. Extraocular muscles intact.  HEENT: Head atraumatic, normocephalic. Oropharynx and nasopharynx clear.  NECK:  Supple, no jugular venous distention. No thyroid enlargement, no tenderness.  LUNGS: Diminished breath sounds bilaterally, no wheezing, no rales,rhonchi or crepitation. No use of accessory  muscles of respiration.  CARDIOVASCULAR: S1, S2 normal. No murmurs, rubs, or gallops.  ABDOMEN: Soft, nontender, distended. Bowel sounds present. No organomegaly or mass.  EXTREMITIES: No pedal edema, cyanosis, or clubbing.  NEUROLOGIC: Cranial nerves II through XII are intact. Muscle strength 4/5 in all extremities. Sensation intact. Gait not checked.  PSYCHIATRIC: The patient is alert and oriented x 3.  SKIN: No obvious rash, lesion, or ulcer.   Physical Exam LABORATORY PANEL:   CBC  Recent Labs Lab 05/06/15 1624  WBC 1.5*  HGB 10.0*  HCT 30.8*  PLT 87*   ------------------------------------------------------------------------------------------------------------------  Chemistries   Recent Labs Lab 05/02/15 0531  05/06/15 0329 05/07/15 0633  NA 135  < > 131* 136  K 3.9  < > 3.4* 3.5  CL 99*  < > 106 109  CO2 24  < > 14* 15*  GLUCOSE 279*  < > 154* 103*  BUN 25*  < > 41* 50*  CREATININE 1.14*  < > 2.50* 2.24*  CALCIUM 7.4*  < > 7.0* 7.7*  MG  --   < > 2.2  --   AST 21  --   --   --   ALT 25  --   --   --   ALKPHOS 183*  --   --   --   BILITOT 0.8  --   --   --   < > = values in this interval not displayed. ------------------------------------------------------------------------------------------------------------------  Cardiac Enzymes  Recent Labs Lab 05/02/15 0531  TROPONINI <0.03   ------------------------------------------------------------------------------------------------------------------  RADIOLOGY:  No results found.  ASSESSMENT AND  PLAN:   Principal Problem:   Nausea & vomiting Active Problems:   CKD (chronic kidney disease)   Pancytopenia ([place])   Community acquired pneumonia   Hypocalcemia   Protein-calorie malnutrition, severe   Chemotherapy-induced neutropenia ([facility])  1. Nausea, vomiting and diarrhea, likely due to chemotherapy, improved.  Symptomatic therapy, discontinued IV fluids, started soft diet, negative KUB  2.  Pancytopenia, WBC is improving, conservative therapy, negative blood cultures, ceftazidime intravenously. neupogen daily, f/u CBC.     3. Suspected pneumonia versus bronchitis, chest x-ray negative. Negative sputum cultures, no source of infection yet. Repeat Xray chest: unremarkable. on ceftazidime intravenously, discontinue today.   4. Sinus tachycardia, possibly related to ARF/dehydration. Improved, continue metoprolol.   * Hypomagnesemia. Improved after magnesium IV. *Hypokalemia, potassium supplement and follow-up BMP. *Acute renal failure. Was on IV fluid, which was discontinued due to wheezing and shortness of breath. Follow-up BMP.  *COPD. continue Xopenex, Flonase and the Spiriva.  * Sleep apnea. CPAP at night.  *Diabetes. Continue sliding scale, NovoLog 3 times a day before meals and Levemir 35 units at at bedtime.  * Hypertension. Continue Lopressor and Cardizem.  * Small cell lung cancer. Status post chemotherapy. Hold chemotherapy per oncologist.  History of chronic CHF. Restarted Lasix but hold if blood pressure is low.  Hypotension. NS 250 ml bolus. BP is 119/102 later. F/u VS.   Per PT, the patient needs skilled nursing facility placement.   All the records are reviewed and case discussed with Care Management/Social Workerr. Management plans discussed with the patient, her daughter and they are in agreement. Greater than 50% time was spent on coordination of care and face-to-face counseling.  CODE STATUS: Full code  TOTAL TIME TAKING CARE OF THIS PATIENT: 45 minutes.    POSSIBLE D/C TO SKILLED NURSING FACILITY IN 2 DAYS, DEPENDING ON CLINICAL CONDITION.   Demetrios Loll M.D on 05/07/2015   Between 7am to 6pm - Pager - [phone]  After 6pm go to [email or web address] - password [email or web address] [facility]  Office  [phone]  CC: Primary care physician; Theotis Burrow, MD  Note: This dictation was prepared with Dragon dictation along with  smaller phrase technology. Any transcriptional errors that result from this process are unintentional.

## 2015-05-07 NOTE — Plan of Care (Signed)
Problem: Safety: Goal: Ability to remain free from injury will improve Outcome: Progressing Pt calls when she needs anything.  2 person asst to [facility] b/c of pt's weakness.   Problem: Fluid Volume: Goal: Ability to maintain a balanced intake and output will improve Outcome: Not Progressing Pt continues to experience unrelieved nausea and vomiting. High level of emesis doesn't correspond to pt's level of intake.  Dr. Grayland Ormond placed her on scheduled zofran and reglan.  If no improvement in next 24-48 hours, will order GI consult.  Also did small amt of fluid bolus for pt's low BP 82/59 - with improvement. WBC improved from .8 to 1.5.  Hgb currently stable at 10.0.  Pt last had chemo a week ago and continues to get radiation daily.  Didn't have it today and didn't work w/PT today b/c of Pt's poor condition today.

## 2015-05-07 NOTE — Plan of Care (Signed)
Problem: Safety: Goal: Ability to remain free from injury will improve Outcome: Progressing Patient demonstrates proper use of call light, bed alarm activated, and patient remains free of injury this shift.   Problem: Fluid Volume: Goal: Ability to maintain a balanced intake and output will improve Outcome: Not Progressing Pt continues to c/o nausea and actively vomiting this shift, MD notified and a new one time order for Phenergan IV entered. Improvement noted, pt able to rest the remainder of shift.

## 2015-05-08 ENCOUNTER — Inpatient Hospital Stay: Payer: Medicare Other

## 2015-05-08 ENCOUNTER — Ambulatory Visit: Payer: Medicare Other

## 2015-05-08 DIAGNOSIS — J449 Chronic obstructive pulmonary disease, unspecified: Secondary | ICD-10-CM

## 2015-05-08 DIAGNOSIS — R197 Diarrhea, unspecified: Secondary | ICD-10-CM

## 2015-05-08 DIAGNOSIS — C3490 Malignant neoplasm of unspecified part of unspecified bronchus or lung: Secondary | ICD-10-CM

## 2015-05-08 DIAGNOSIS — J9621 Acute and chronic respiratory failure with hypoxia: Secondary | ICD-10-CM

## 2015-05-08 DIAGNOSIS — D6481 Anemia due to antineoplastic chemotherapy: Secondary | ICD-10-CM

## 2015-05-08 DIAGNOSIS — C787 Secondary malignant neoplasm of liver and intrahepatic bile duct: Secondary | ICD-10-CM

## 2015-05-08 DIAGNOSIS — Z9221 Personal history of antineoplastic chemotherapy: Secondary | ICD-10-CM

## 2015-05-08 DIAGNOSIS — I10 Essential (primary) hypertension: Secondary | ICD-10-CM

## 2015-05-08 DIAGNOSIS — E43 Unspecified severe protein-calorie malnutrition: Secondary | ICD-10-CM

## 2015-05-08 DIAGNOSIS — Z87891 Personal history of nicotine dependence: Secondary | ICD-10-CM

## 2015-05-08 DIAGNOSIS — R5081 Fever presenting with conditions classified elsewhere: Secondary | ICD-10-CM

## 2015-05-08 DIAGNOSIS — D702 Other drug-induced agranulocytosis: Secondary | ICD-10-CM

## 2015-05-08 LAB — BASIC METABOLIC PANEL
Anion gap: 12 (ref 5–15)
BUN: 65 mg/dL — AB (ref 6–20)
CALCIUM: 8 mg/dL — AB (ref 8.9–10.3)
CHLORIDE: 102 mmol/L (ref 101–111)
CO2: 19 mmol/L — ABNORMAL LOW (ref 22–32)
CREATININE: 2.56 mg/dL — AB (ref 0.44–1.00)
GFR calc Af Amer: 21 mL/min — ABNORMAL LOW (ref >60)
GFR calc non Af Amer: 18 mL/min — ABNORMAL LOW (ref >60)
Glucose, Bld: 195 mg/dL — ABNORMAL HIGH (ref 65–99)
Potassium: 3.1 mmol/L — ABNORMAL LOW (ref 3.5–5.1)
SODIUM: 133 mmol/L — AB (ref 135–145)

## 2015-05-08 LAB — BLOOD GAS, ARTERIAL
ALLENS TEST (PASS/FAIL): POSITIVE — AB
Acid-base deficit: 8.9 mmol/L — ABNORMAL HIGH (ref 0.0–2.0)
Bicarbonate: 15.6 mEq/L — ABNORMAL LOW (ref 21.0–28.0)
FIO2: 0.32
O2 SAT: 97.8 %
PATIENT TEMPERATURE: 37
PO2 ART: 107 mmHg (ref 83.0–108.0)
pCO2 arterial: 29 mmHg — ABNORMAL LOW (ref 32.0–48.0)
pH, Arterial: 7.34 — ABNORMAL LOW (ref 7.350–7.450)

## 2015-05-08 LAB — CULTURE, BLOOD (ROUTINE X 2)
Culture: NO GROWTH
Culture: NO GROWTH

## 2015-05-08 LAB — GLUCOSE, CAPILLARY
GLUCOSE-CAPILLARY: 173 mg/dL — AB (ref 65–99)
Glucose-Capillary: 207 mg/dL — ABNORMAL HIGH (ref 65–99)
Glucose-Capillary: 232 mg/dL — ABNORMAL HIGH (ref 65–99)

## 2015-05-08 LAB — CBC WITH DIFFERENTIAL/PLATELET
BASOS PCT: 0 %
Band Neutrophils: 25 %
Basophils Absolute: 0 10*3/uL (ref 0–0.1)
Blasts: 0 %
EOS PCT: 1 %
Eosinophils Absolute: 0.1 10*3/uL (ref 0–0.7)
HCT: 32.9 % — ABNORMAL LOW (ref 35.0–47.0)
HEMOGLOBIN: 10.9 g/dL — AB (ref 12.0–16.0)
LYMPHS ABS: 0.4 10*3/uL — AB (ref 1.0–3.6)
Lymphocytes Relative: 7 %
MCH: 28.6 pg (ref 26.0–34.0)
MCHC: 33.2 g/dL (ref 32.0–36.0)
MCV: 86.1 fL (ref 80.0–100.0)
MONO ABS: 0.6 10*3/uL (ref 0.2–0.9)
MONOS PCT: 11 %
Metamyelocytes Relative: 2 %
Myelocytes: 3 %
NEUTROS ABS: 4.4 10*3/uL (ref 1.4–6.5)
NEUTROS PCT: 51 %
NRBC: 0 /100{WBCs}
OTHER: 0 %
PLATELETS: 129 10*3/uL — AB (ref 150–440)
Promyelocytes Absolute: 0 %
RBC: 3.82 MIL/uL (ref 3.80–5.20)
RDW: 14.9 % — AB (ref 11.5–14.5)
WBC: 5.5 10*3/uL (ref 3.6–11.0)

## 2015-05-08 LAB — MAGNESIUM: MAGNESIUM: 2 mg/dL (ref 1.7–2.4)

## 2015-05-08 MED ORDER — POTASSIUM CHLORIDE IN NACL 20-0.9 MEQ/L-% IV SOLN
INTRAVENOUS | Status: DC
  Administered 2015-05-08: 12:00:00 via INTRAVENOUS
  Filled 2015-05-08 (×2): qty 1000

## 2015-05-08 MED ORDER — GLYCOPYRROLATE 0.2 MG/ML IJ SOLN
0.1000 mg | Freq: Four times a day (QID) | INTRAMUSCULAR | Status: DC | PRN
  Administered 2015-05-08 – 2015-05-09 (×3): 0.1 mg via INTRAVENOUS
  Filled 2015-05-08 (×4): qty 0.5

## 2015-05-08 MED ORDER — MORPHINE SULFATE (CONCENTRATE) 10 MG/0.5ML PO SOLN
10.0000 mg | ORAL | Status: DC | PRN
  Administered 2015-05-08: 19:00:00 10 mg via ORAL
  Filled 2015-05-08: qty 1

## 2015-05-08 MED ORDER — MORPHINE SULFATE (CONCENTRATE) 10 MG/0.5ML PO SOLN: 5.0000 mg | ORAL | Status: DC | PRN

## 2015-05-08 MED ORDER — PROCHLORPERAZINE 25 MG RE SUPP
25.0000 mg | Freq: Two times a day (BID) | RECTAL | Status: DC | PRN
  Filled 2015-05-08: qty 1

## 2015-05-08 MED ORDER — HEPARIN SODIUM (PORCINE) 5000 UNIT/ML IJ SOLN: 5000.0000 [IU] | Freq: Three times a day (TID) | INTRAMUSCULAR | Status: DC

## 2015-05-08 MED ORDER — IPRATROPIUM-ALBUTEROL 0.5-2.5 (3) MG/3ML IN SOLN
3.0000 mL | Freq: Once | RESPIRATORY_TRACT | Status: AC
  Administered 2015-05-08: 3 mL via RESPIRATORY_TRACT

## 2015-05-08 MED ORDER — MORPHINE SULFATE (PF) 4 MG/ML IV SOLN
3.0000 mg | INTRAVENOUS | Status: DC | PRN
  Administered 2015-05-08 – 2015-05-09 (×4): 3 mg via INTRAVENOUS
  Filled 2015-05-08 (×3): qty 1
  Filled 2015-05-08: qty 3
  Filled 2015-05-08: qty 1

## 2015-05-08 MED ORDER — METOCLOPRAMIDE HCL 5 MG/ML IJ SOLN
5.0000 mg | Freq: Four times a day (QID) | INTRAMUSCULAR | Status: DC
  Administered 2015-05-08 – 2015-05-09 (×4): 5 mg via INTRAVENOUS
  Filled 2015-05-08 (×4): qty 2

## 2015-05-08 MED ORDER — ACETAMINOPHEN 325 MG PO TABS: 650.0000 mg | ORAL_TABLET | ORAL | Status: AC | PRN

## 2015-05-08 MED ORDER — LORAZEPAM 2 MG/ML IJ SOLN
1.0000 mg | Freq: Three times a day (TID) | INTRAMUSCULAR | Status: DC | PRN
  Administered 2015-05-08: 1 mg via INTRAVENOUS
  Filled 2015-05-08: qty 1

## 2015-05-08 MED ORDER — PROCHLORPERAZINE 25 MG RE SUPP: 25.0000 mg | Freq: Two times a day (BID) | RECTAL | Status: AC | PRN

## 2015-05-08 MED ORDER — ARTIFICIAL TEARS 0.1-0.3 % OP SOLN: 1.0000 [drp] | OPHTHALMIC | Status: AC | PRN

## 2015-05-08 MED ORDER — LORAZEPAM 1 MG PO TABS: 1.0000 mg | ORAL_TABLET | ORAL | Status: AC | PRN

## 2015-05-08 MED ORDER — BISACODYL 10 MG RE SUPP: 10.0000 mg | Freq: Every day | RECTAL | Status: AC | PRN

## 2015-05-08 MED ORDER — IPRATROPIUM-ALBUTEROL 0.5-2.5 (3) MG/3ML IN SOLN
RESPIRATORY_TRACT | Status: AC
  Administered 2015-05-08: 3 mL via RESPIRATORY_TRACT
  Filled 2015-05-08: qty 3

## 2015-05-08 MED ORDER — MORPHINE SULFATE (CONCENTRATE) 10 MG/0.5ML PO SOLN: 10.0000 mg | ORAL | Status: AC | PRN

## 2015-05-08 MED ORDER — GLYCOPYRROLATE 1 MG PO TABS: 1.0000 mg | ORAL_TABLET | Freq: Four times a day (QID) | ORAL | Status: AC | PRN

## 2015-05-08 MED ORDER — POTASSIUM CHLORIDE CRYS ER 20 MEQ PO TBCR: 40.0000 meq | EXTENDED_RELEASE_TABLET | Freq: Once | ORAL | Status: DC

## 2015-05-08 MED ORDER — BISACODYL 10 MG RE SUPP: 10.0000 mg | Freq: Every day | RECTAL | Status: DC | PRN

## 2015-05-08 NOTE — Plan of Care (Signed)
Pt was very lethargic at beginning of shift - even with getting ativan.  ABG was done and pt had low bicarb likely from profuse vomiting yesterday.  BP low.  Pt started on IVF.  Palliative care consult and nephrology consult today.  Pt had renal U/S.  Possible bowel obstruction.  Pt made comfort care - may go to hospice home tomorrow.  Trying to wean her O2.  Had been put on bipap for respiratory distress. Pt moved to larger room to accommodate family, and bereavement cart ordered. Chaplain also rounding.

## 2015-05-08 NOTE — Consult Note (Signed)
Palliative Medicine Inpatient Consult Note   Name: Sherry Prince Date: 05/08/2015 MRN: [id number]  DOB: 03/30/1946  Referring Physician: Demetrios Loll, MD  Palliative Care consult requested for this 69 y.o. female for goals of medical therapy in patient with small cell lung carcinoma.  She is declining.   TODAY'S DISCUSSIONS AND DECISIONS: 1.  Pt is already DNR.  I placed portable document in the paper chart  2.  Pt has two sons who are decision makers. Pt seems confused and thus their input is needed.  One son's wife is reportedly HCPOA (but no documents are in the record to this effect). She reports that all of them do not want patient to suffer.  3.  I am waiting on one of the son's to return to the room. We will then discuss options for comfort care.  Pt is on BIPAP, so that poses some challenges, but none that cannot be addressed.     IMPRESSION: 1.  Small Cell Lung Cancer --stage IV 'extensive'  ---had chemo put on hold by oncologist (Dr Grayland Ormond) ---extensive mediastinal left hilar subcarinal and contralateral paratracheal adenopathy ---numerous hypermetabolic masses in the liver c/w hepatic mets. ---palliative radiation given Oct 13 2.  Neutropenic Fever 3.  Essential HTN 4.  Obstructive Sleep Apnea 5.  COPD 6.  Intermitteent Diarrhea 7.  H/O stage IV breast cancer ---modified radical mastc with adjuvant chemo but no radiation (remotely at [facility]).  8.  Pancytopenia 9.  GERD 10. DM2 11. Acute on Chronic Respiratory Failure with hypoxemia 12. Metabolic acidosis 13.  Hypotension 14.  Suspected pneumonia vs bronchitis with negative CXR 15.  Acute renal failure on CKD stage  16.  Metabolic Acidosis 17.  Nausea and Vomiting  18.  Severe  Malnutrition 19.  Former Smoker 74.  Anemia due to chemo --transfuesed and given Neupogen as of Nov 6.      REVIEW OF SYSTEMS:  Patient is not able to provide ROS due to confusion and distress on.BIPAP for last 3.5 hours.  She has  course airway sounds and complains of not being able to breathe.  She went into respiratory distress quickly when it was turned off once today.   SPIRITUAL SUPPORT SYSTEM: Yes  Family appears to be very supportive. .  SOCIAL HISTORY:  reports that she has quit smoking. She does not have any smokeless tobacco history on file. She reports that she does not drink alcohol or use illicit drugs.  LEGAL DOCUMENTS:  I have now placed a DNR form in the chart as pt is DNR status.     CODE STATUS: DNR  PAST MEDICAL HISTORY: Past Medical History  Diagnosis Date  . Diabetes mellitus without complication ([place])   . Chronic kidney disease   . GERD (gastroesophageal reflux disease)   . Hypertension   . Ear pain   . Serum calcium elevated   . Floaters   . Sleep apnea   . Cancer ([place])   . Liver cancer ([place])   . Cervical cancer ([place])   . Bone cancer ([place])   . CHF (congestive heart failure) ([place])   . COPD (chronic obstructive pulmonary disease) ([place])     PAST SURGICAL HISTORY:  Past Surgical History  Procedure Laterality Date  . Abdominal hysterectomy    . Breast surgery    . Eye surgery    . Endobronchial ultrasound N/A 03/29/2015    Procedure: ENDOBRONCHIAL ULTRASOUND;  Surgeon: Flora Lipps, MD;  Location: [facility] ORS;  Service: Cardiopulmonary;  Laterality: N/A;    ALLERGIES:  is allergic to peanut oil and penicillins.  MEDICATIONS:  Current Facility-Administered Medications  Medication Dose Route Frequency Provider Last Rate Last Dose  . 0.9 % NaCl with KCl 20 mEq/ L  infusion   Intravenous Continuous Demetrios Loll, MD 50 mL/hr at 05/08/15 1211    . acetaminophen (TYLENOL) tablet 650 mg  650 mg Oral Q6H PRN Theodoro Grist, MD   650 mg at 05/03/15 1500   Or  . acetaminophen (TYLENOL) suppository 650 mg  650 mg Rectal Q6H PRN Theodoro Grist, MD      . atorvastatin (LIPITOR) tablet 40 mg  40 mg Oral Daily Theodoro Grist, MD   40 mg at 05/06/15 0828  . benzonatate (TESSALON) capsule 200 mg  200 mg  Oral TID PRN Theodoro Grist, MD   200 mg at 05/05/15 2120  . budesonide-formoterol (SYMBICORT) 160-4.5 MCG/ACT inhaler 2 puff  2 puff Inhalation BID Theodoro Grist, MD   2 puff at 05/07/15 2139  . buPROPion (WELLBUTRIN SR) 12 hr tablet 100 mg  100 mg Oral Daily Theodoro Grist, MD   100 mg at 05/06/15 0828  . calcium carbonate (TUMS - dosed in mg elemental calcium) chewable tablet 200 mg of elemental calcium  1 tablet Oral TID WC Harrie Foreman, MD   200 mg of elemental calcium at 05/06/15 1622  . citalopram (CELEXA) tablet 40 mg  40 mg Oral q morning - 10a Theodoro Grist, MD   40 mg at 05/06/15 0829  . feeding supplement (GLUCERNA SHAKE) (GLUCERNA SHAKE) liquid 237 mL  237 mL Oral TID WC Vaughan Basta, MD   237 mL at 05/07/15 1354  . fluticasone (FLONASE) 50 MCG/ACT nasal spray 1 spray  1 spray Each Nare Daily PRN Theodoro Grist, MD      . gabapentin (NEURONTIN) capsule 300 mg  300 mg Oral QHS Theodoro Grist, MD   300 mg at 05/07/15 2040  . heparin injection 5,000 Units  5,000 Units Subcutaneous 3 times per day Demetrios Loll, MD      . HYDROcodone-acetaminophen (NORCO/VICODIN) 5-325 MG per tablet 1-2 tablet  1-2 tablet Oral Q6H PRN Theodoro Grist, MD   1 tablet at 05/07/15 2314  . insulin aspart (novoLOG) injection 0-9 Units  0-9 Units Subcutaneous TID WC Theodoro Grist, MD   3 Units at 05/08/15 1413  . insulin aspart (novoLOG) injection 3 Units  3 Units Subcutaneous TID WC Theodoro Grist, MD   3 Units at 05/07/15 1304  . insulin detemir (LEVEMIR) injection 35 Units  35 Units Subcutaneous QHS Theodoro Grist, MD   35 Units at 05/07/15 2143  . levalbuterol (XOPENEX) nebulizer solution 1.25 mg  1.25 mg Nebulization QID Demetrios Loll, MD   1.25 mg at 05/08/15 1114  . levalbuterol (XOPENEX) nebulizer solution 1.25 mg  1.25 mg Nebulization Q4H PRN Demetrios Loll, MD   1.25 mg at 05/08/15 0110  . loratadine (CLARITIN) tablet 10 mg  10 mg Oral Daily Theodoro Grist, MD   10 mg at 05/06/15 0828  . LORazepam (ATIVAN)  injection 1 mg  1 mg Intravenous TID PRN Demetrios Loll, MD   1 mg at 05/08/15 1340  . metoCLOPramide (REGLAN) injection 10 mg  10 mg Intravenous 4 times per day Whitehurst Huger, MD   10 mg at 05/08/15 1413  . morphine 2 MG/ML injection 2 mg  2 mg Intravenous Q3H PRN Theodoro Grist, MD   2 mg at 05/04/15 2342  . ondansetron (ZOFRAN) 8 mg  in sodium chloride 0.9 % 50 mL IVPB  8 mg Intravenous 3 times per day Milos Huger, MD   8 mg at 05/08/15 0508  . pantoprazole (PROTONIX) EC tablet 40 mg  40 mg Oral Daily Theodoro Grist, MD   40 mg at 05/06/15 0828  . polyvinyl alcohol (LIQUIFILM TEARS) 1.4 % ophthalmic solution 1 drop  1 drop Both Eyes PRN Theodoro Grist, MD      . potassium chloride SA (K-DUR,KLOR-CON) CR tablet 40 mEq  40 mEq Oral Once Demetrios Loll, MD   40 mEq at 05/08/15 1120  . prochlorperazine (COMPAZINE) tablet 10 mg  10 mg Oral Q6H PRN Theodoro Grist, MD   10 mg at 05/07/15 0833  . promethazine (PHENERGAN) tablet 25 mg  25 mg Oral Q6H PRN Theodoro Grist, MD   25 mg at 05/06/15 0534  . sodium chloride 0.9 % injection 3 mL  3 mL Intravenous Q12H Theodoro Grist, MD   3 mL at 05/08/15 1100  . tiotropium (SPIRIVA) inhalation capsule 18 mcg  1 capsule Inhalation Daily Theodoro Grist, MD   18 mcg at 05/06/15 0828  . traZODone (DESYREL) tablet 50 mg  50 mg Oral QHS Theodoro Grist, MD   50 mg at 05/07/15 2041    Vital Signs: BP 94/75 mmHg  Pulse 78  Temp(Src) 97.7 F (36.5 C) (Oral)  Resp 16  Ht '5\' 5"'  (1.651 m)  Wt 111.585 kg (246 lb)  BMI 40.94 kg/m2  SpO2 91% Filed Weights   05/02/15 0524  Weight: 111.585 kg (246 lb)    Estimated body mass index is 40.94 kg/(m^2) as calculated from the following:   Height as of this encounter: '5\' 5"'  (1.651 m).   Weight as of this encounter: 111.585 kg (246 lb).  PERFORMANCE STATUS (ECOG) : 4 - Bedbound  PHYSICAL EXAM: Restless on BIPAP  Lying in medical bed Grabbing at daughter in law. Talking but not making much sense. No JVD or TM Hrt rrr  tachy Lungs some ronchi no rales hears Abd soft and NT Ext no CCE Skin no mottling --no cyanosis.   LABS: CBC:    Component Value Date/Time   WBC 5.5 05/08/2015 0518   WBC 12.2* 06/18/2014 1125   HGB 10.9* 05/08/2015 0518   HGB 12.0 06/18/2014 1125   HCT 32.9* 05/08/2015 0518   HCT 37.9 06/18/2014 1125   PLT 129* 05/08/2015 0518   PLT 315 06/18/2014 1125   MCV 86.1 05/08/2015 0518   MCV 91 06/18/2014 1125   NEUTROABS 4.4 05/08/2015 0518   NEUTROABS 14.3* 03/25/2014 0229   LYMPHSABS 0.4* 05/08/2015 0518   LYMPHSABS 0.8* 03/25/2014 0229   MONOABS 0.6 05/08/2015 0518   MONOABS 0.2 03/25/2014 0229   EOSABS 0.1 05/08/2015 0518   EOSABS 0.0 03/25/2014 0229   BASOSABS 0.0 05/08/2015 0518   BASOSABS 0.0 03/25/2014 0229   Comprehensive Metabolic Panel:    Component Value Date/Time   NA 133* 05/08/2015 0518   NA 136 06/18/2014 1125   K 3.1* 05/08/2015 0518   K 4.1 06/18/2014 1125   CL 102 05/08/2015 0518   CL 102 06/18/2014 1125   CO2 19* 05/08/2015 0518   CO2 29 06/18/2014 1125   BUN 65* 05/08/2015 0518   BUN 12 06/18/2014 1125   CREATININE 2.56* 05/08/2015 0518   CREATININE 0.98 06/18/2014 1125   GLUCOSE 195* 05/08/2015 0518   GLUCOSE 209* 06/18/2014 1125   CALCIUM 8.0* 05/08/2015 0518   CALCIUM 8.2* 06/18/2014 1125   AST  21 05/02/2015 0531   AST 17 03/24/2014 0147   ALT 25 05/02/2015 0531   ALT 19 03/24/2014 0147   ALKPHOS 183* 05/02/2015 0531   ALKPHOS 90 03/24/2014 0147   BILITOT 0.8 05/02/2015 0531   BILITOT 0.3 03/24/2014 0147   PROT 6.6 05/02/2015 0531   PROT 6.5 03/24/2014 0147   ALBUMIN 3.1* 05/02/2015 0531   ALBUMIN 2.9* 03/24/2014 0147    More than 50% of the visit was spent in counseling/coordination of care: Yes  Time Spent: 80 minutes

## 2015-05-08 NOTE — Progress Notes (Signed)
[place] [facility] [place] NAME: Sherry Prince    MR#:  [id number]  DATE OF BIRTH:  10/03/1945  SUBJECTIVE:  CHIEF COMPLAINT:   Chief Complaint  Patient presents with  . Emesis    ems pt from home pt has history of Lung CA recent chemo and radiation has been vomiting since yesterday taking phenergan with no improvment   The pt is lethargic and less unresponsive,  vomited last night, upper airway wheezing.  on O2 ventimask.  BP is low at 88/57 just now.  REVIEW OF SYSTEMS:  Unable to get ROS.  ROS  DRUG ALLERGIES:   Allergies  Allergen Reactions  . Peanut Oil Anaphylaxis  . Penicillins Hives    VITALS:  Blood pressure 88/57, pulse 101, temperature 97.7 F (36.5 C), temperature source Oral, resp. rate 16, height '5\' 5"'$  (1.651 m), weight 111.585 kg (246 lb), SpO2 91 %.  PHYSICAL EXAMINATION:  GENERAL:  69 y.o.-year-old patient lying in the bed with no acute distress. Obese.  EYES:  No scleral icterus. Extraocular muscles intact.  HEENT: Head atraumatic, normocephalic.on ventimask.  NECK:  Supple, no jugular venous distention. No thyroid enlargement, no tenderness.  LUNGS: Diminished breath sounds bilaterally, upper airway wheezing, no rales,rhonchi or crepitation. No use of accessory muscles of respiration.  CARDIOVASCULAR: S1, S2 normal. No murmurs, rubs, or gallops.  ABDOMEN: Soft, nontender, distended. Bowel sounds present. No organomegaly or mass.  EXTREMITIES: No pedal edema, cyanosis, or clubbing.  NEUROLOGIC: unable to obtain. PSYCHIATRIC: The patient is lethargic and unresponsive to verbal stimuli. SKIN: No obvious rash, lesion, or ulcer.   Physical Exam LABORATORY PANEL:   CBC  Recent Labs Lab 05/08/15 0518  WBC 5.5  HGB 10.9*  HCT 32.9*  PLT 129*   ------------------------------------------------------------------------------------------------------------------  Chemistries   Recent Labs Lab 05/02/15 0531   05/08/15 0518  NA 135  < > 133*  K 3.9  < > 3.1*  CL 99*  < > 102  CO2 24  < > 19*  GLUCOSE 279*  < > 195*  BUN 25*  < > 65*  CREATININE 1.14*  < > 2.56*  CALCIUM 7.4*  < > 8.0*  MG  --   < > 2.0  AST 21  --   --   ALT 25  --   --   ALKPHOS 183*  --   --   BILITOT 0.8  --   --   < > = values in this interval not displayed. ------------------------------------------------------------------------------------------------------------------  Cardiac Enzymes  Recent Labs Lab 05/02/15 0531  TROPONINI <0.03   ------------------------------------------------------------------------------------------------------------------  RADIOLOGY:  No results found.  ASSESSMENT AND PLAN:   Principal Problem:   Nausea & vomiting Active Problems:   CKD (chronic kidney disease)   Pancytopenia ([place])   Community acquired pneumonia   Hypocalcemia   Protein-calorie malnutrition, severe   Chemotherapy-induced neutropenia (HCC)  1. Nausea, vomiting and diarrhea, likely due to chemotherapy,  Symptomatic therapy, resume IV fluids.  2. Pancytopenia, WBC is improved, conservative therapy, negative blood cultures, discontinue ceftazidime intravenously.  Discontinue neupogen daily.     3. Suspected pneumonia versus bronchitis, chest x-ray negative. Negative sputum cultures, no source of infection yet. Repeat Xray chest: unremarkable. on ceftazidime intravenously, discontinued.  4. Sinus tachycardia, possibly related to ARF/dehydration. continue metoprolol.   * Hypomagnesemia. Improved after magnesium IV. *Hypokalemia, potassium supplement and follow-up BMP. *Acute renal failure. Was on IV fluid, which was discontinued due to wheezing and shortness of  breath. Start NS with KCl, nephrology consult.  Follow-up BMP.  * metabolic acidosis. F/u Dr. Juleen China.  *COPD. continue Xopenex, Flonase and the Spiriva.  * Sleep apnea. CPAP at night.  *Diabetes. Continue sliding scale, NovoLog 3 times a day  before meals and Levemir 35 units at at bedtime.  * Hypertension. Low BP, discontinue Lopressor and Cardizem.  * Small cell lung cancer. Status post chemotherapy. Hold chemotherapy per oncologist. Palliative care consult.  History of chronic CHF. Restarted Lasix but hold if blood pressure is low.  Hypotension. Continue NS iv. Hold lasix, lopressor and cardizem.  Acute metabolic encephalopathy. Due to above.  D/w Dr. Juleen China. The patient condition is declining, has high risk of cardiopulmonary arrest.  All the records are reviewed and case discussed with Care Management/Social Workerr. Management plans discussed with the patient's daughter and they are in agreement. Greater than 50% time was spent on coordination of care and face-to-face counseling.  CODE STATUS: DNR.  TOTAL  Critical TIME TAKING CARE OF THIS PATIENT: 55 minutes.    POSSIBLE D/C TO SKILLED NURSING FACILITY IN 3 DAYS, DEPENDING ON CLINICAL CONDITION.   Demetrios Loll M.D on 05/08/2015   Between 7am to 6pm - Pager - [phone]  After 6pm go to [email or web address] - password [email or web address] [facility]  Office  [phone]  CC: Primary care physician; Theotis Burrow, MD  Note: This dictation was prepared with Dragon dictation along with smaller phrase technology. Any transcriptional errors that result from this process are unintentional.

## 2015-05-08 NOTE — Progress Notes (Signed)
CSW spoke with Pt's daughter-in-law Caren Griffins on Monday afternoon. We discussed SNF offers, potential need for Pt to be out of SNF during chemo and then potentially return for more rehab after chemo. Pt's daughter-in-law Caren Griffins shared that she is concerned that the Pt will not be able to tolerate next round of chemo. Pt's family has selected [facility] for SNF at [employer].  CSW will continue to follow as Pt's care progresses.   Toma Copier, [place]

## 2015-05-08 NOTE — Progress Notes (Signed)
   05/08/15 1500  Clinical Encounter Type  Visited With Patient and family together  Visit Type Initial;Spiritual support  Referral From Nurse;Chaplain  Consult/Referral To Chaplain  Spiritual Encounters  Spiritual Needs Emotional;Prayer  Provided pastoral support, presence and prayer to patient and family members on unit.  Will follow up again tomorrow morning and will pass update to on call chaplain tonight.  [place] [phone]

## 2015-05-08 NOTE — Progress Notes (Signed)
PT Cancellation Note  Patient Details Name: Sherry Prince MRN: [id number] DOB: Jan 27, 1946   Cancelled Treatment:    Reason Eval/Treat Not Completed: Other (comment);Medical issues which prohibited therapy. Treatment attempted; pt in distress/restless and with altered mental status. Spoke with family who declines PT attempt and notes they are awaiting hospice/palliative consult. Will await any new MD orders.    Erline Levine Bishop 05/08/2015, 2:38 PM

## 2015-05-08 NOTE — Progress Notes (Signed)
New hospice home referral received from Palliative medicine physician Dr. Megan Salon. Information faxed to referral, patient to be weaned from [place] and transitioned to comfort care. Daughter in law [place], [place],  not present, Probation officer to follow up in the morning with plan to transfer Sherry Prince to the hospice home tomorrow 11/9. Staff RN Stanton Kidney, CSW Baxter Flattery and Dr. Megan Salon all made aware. Dr. Megan Salon has spoken to attending physician Dr. Bridgett Larsson and consulting physicians regarding this plan, all in agreement. Thank you. Flo Shanks RN, BSN, [place] [facility] [place], [facility] [phone] c

## 2015-05-08 NOTE — Plan of Care (Signed)
Problem: Fluid Volume: Goal: Ability to maintain a balanced intake and output will improve Outcome: Not Progressing Plan of care, progress to goals. 1. Pt with good understanding of teaching regarding oxygen and nebulizer. 2. Pt remains free from falls. 3. Pt continues to have emesis and nausea with poor po intake. Managed with IV reglan and zofran tonight. 4. Skin remains cdi with out injury. 5. Pt continues to have expiratory wheeze and required additional breathing tx with albuterol per RT.  Pt refused to use cpap tonight. Dorna Bloom RN

## 2015-05-08 NOTE — Plan of Care (Signed)
Pt is very lethargic - called respiratory - he put her on cpap.  She has been refusing last couple of nights.  He also did an ABG and her bicarb was 15.  Also called Dr. Bridgett Larsson.  Pt is very lethargic.

## 2015-05-08 NOTE — Progress Notes (Addendum)
Palliative Care Update  Pt's family all gathered together for a meeting.  They all asked for comfort terminal care and inquired about Hospice. Family now agrees with Hospice Home after various options for care were described --in various settings.  They live in [place].  Two sons are here and the one daughter-in-law who says she is HCPOA.  Many other family members are present and all were in agreement that they do not want pt to suffer.  They agree with Hospice Home.   The patient is currently on BIPAP, but hasn't been on this for long and it is thought that she can be weaned to O2 via [place] or [place]. Discussed with RT. We will see how this goes.  Pt has her own CPAP machine (at home) which could be brought to [facility], if needed.  But RT feels pt might be fine on less O2. We will see. Pt is so distressed even with adequate oxygen, that she will need regular dosing of morphine to treat not only air hunger but apparent pain.  Pt is not able to converse with [place] now as she is greatly confused.    She may go to [facility] later tonight, or tomorrow.  She could pass away here.  We will have to go with what is to be.  I will update other doctors involved in her care.   I have done the medication reconciliation for discharge with a focus on comfort care.  For today, I will leave her Xopenex nebs going until we see how she is doing off of BIPAP.   I have called and spoken with Dr Grayland Ormond and Dr Abigail Butts:  and have paged Dr. Bridgett Larsson just now to provide updates to all of pts active physicians.    Colleen Can, MD

## 2015-05-08 NOTE — Progress Notes (Signed)
Informed Dr. Lavetta Nielsen of patient, patient given xopenex with no relief of shortness of breath, Gave duoneb, patient looks much more comfortable and states some relief.

## 2015-05-09 ENCOUNTER — Ambulatory Visit: Payer: Medicare Other

## 2015-05-09 ENCOUNTER — Ambulatory Visit: Payer: Medicare Other | Admitting: Pain Medicine

## 2015-05-09 DIAGNOSIS — N179 Acute kidney failure, unspecified: Secondary | ICD-10-CM | POA: Insufficient documentation

## 2015-05-09 MED ORDER — MORPHINE SULFATE (CONCENTRATE) 10 MG/0.5ML PO SOLN
5.0000 mg | ORAL | Status: DC | PRN
  Administered 2015-05-09: 5 mg via ORAL
  Filled 2015-05-09: qty 1

## 2015-05-09 MED ORDER — HEPARIN SOD (PORK) LOCK FLUSH 100 UNIT/ML IV SOLN
500.0000 [IU] | INTRAVENOUS | Status: AC | PRN
  Administered 2015-05-09: 11:00:00 500 [IU]
  Filled 2015-05-09: qty 5

## 2015-05-09 MED ORDER — MORPHINE SULFATE (PF) 4 MG/ML IV SOLN
3.0000 mg | INTRAVENOUS | Status: DC | PRN
  Filled 2015-05-09: qty 1

## 2015-05-09 MED ORDER — SCOPOLAMINE 1 MG/3DAYS TD PT72
1.0000 | MEDICATED_PATCH | TRANSDERMAL | Status: DC
  Administered 2015-05-09: 1.5 mg via TRANSDERMAL
  Filled 2015-05-09: qty 1

## 2015-05-09 MED ORDER — MORPHINE SULFATE (CONCENTRATE) 10 MG/0.5ML PO SOLN: 10.0000 mg | ORAL | Status: DC | PRN

## 2015-05-09 NOTE — Progress Notes (Signed)
Pt continues to decline.  Pt has expiratory wheeze and gurgle.  Spoke with Dr Lavetta Nielsen and received order for Scopolamine patch.  Pt receiving Morphine IV and Robinul IV.  Chaplain with pt and family. Dorna Bloom RN

## 2015-05-09 NOTE — Plan of Care (Signed)
Problem: Fluid Volume: Goal: Ability to maintain a balanced intake and output will improve Outcome: Not Applicable Date Met:  80/06/34 Plan of care, progress to goals. 1. Pt transferred to comfort care today and family with understanding of palliative care . 2. Pt remains injury free. 3. Fluids stopped and pt provided with comfort measures only. 4.  Pt with increased respiratory distress and gurgle.  Given IV Morphine, IV Robinul, and scopolamine patch ordered and applied. Dorna Bloom RN

## 2015-05-09 NOTE — Discharge Summary (Signed)
[place] [facility] [place] NAME: Sherry Prince    MR#:  [id number]  DATE OF BIRTH:  [birth year]  DATE OF ADMISSION:  05/02/2015 ADMITTING PHYSICIAN: Theodoro Grist, MD  DATE OF DISCHARGE: 05/09/2015   PRIMARY CARE PHYSICIAN: Theotis Burrow, MD    ADMISSION DIAGNOSIS:  Cough [R05] Dehydration [E86.0] Chemotherapy-induced neutropenia (HCC) [D70.1] Non-intractable vomiting with nausea, vomiting of unspecified type [R11.2]  DISCHARGE DIAGNOSIS:  Principal Problem:   Nausea & vomiting Active Problems:   CKD (chronic kidney disease)   Pancytopenia ([place])   Community acquired pneumonia   Hypocalcemia   Protein-calorie malnutrition, severe   Chemotherapy-induced neutropenia ([place])  SECONDARY DIAGNOSIS:   Past Medical History  Diagnosis Date  . Diabetes mellitus without complication ([place])   . Chronic kidney disease   . GERD (gastroesophageal reflux disease)   . Hypertension   . Ear pain   . Serum calcium elevated   . Floaters   . Sleep apnea   . Cancer ([place])   . Liver cancer ([place])   . Cervical cancer ([place])   . Bone cancer ([place])   . CHF (congestive heart failure) ([place])   . COPD (chronic obstructive pulmonary disease) [facility])    HOSPITAL COURSE:  69 y.o. female with a known history of stage IV breast carcinoma and small cell lung carcinoma, also diabetes. CKD, hypertension, obstructive sleep apnea, CHF, COPD admitted to the hospital for generalized weakness, nausea, vomiting over the past 2 days and a few episodes of diarrheal stool.  Nares see Dr. Keenan Bachelor dated history and physical for further details.   Oncology consultation was obtained who recommended palliative care evaluation due to her declining clinical condition and this was performed by Dr. Megan Salon who had extensive discussion with family and decision was made for hospice home placement, which patient and family were in agreement.  Due to her very poor condition and poor  prognosis.  She has a hospice home bed available today where she is being discharged in fair condition. DISCHARGE CONDITIONS:  fair CONSULTS OBTAINED:  Treatment Team:  Marsteller Huger, MD Teodoro Spray, MD DRUG ALLERGIES:   Allergies  Allergen Reactions  . Peanut Oil Anaphylaxis  . Penicillins Hives   DISCHARGE MEDICATIONS:   Current Discharge Medication List    START taking these medications   Details  acetaminophen (TYLENOL) 325 MG tablet Take 2 tablets (650 mg total) by mouth every 4 (four) hours as needed for mild pain or fever (or Fever >/= 101).    bisacodyl (DULCOLAX) 10 MG suppository Place 1 suppository (10 mg total) rectally daily as needed for moderate constipation. Qty: 6 suppository, Refills: 0    glycopyrrolate (ROBINUL) 1 MG tablet Take 1 tablet (1 mg total) by mouth every 6 (six) hours as needed (prn excessive secretions). Qty: 15 tablet, Refills: 0    !! Morphine Sulfate (MORPHINE CONCENTRATE) 10 MG/0.5ML SOLN concentrated solution Take 0.5 mLs (10 mg total) by mouth every 2 (two) hours as needed for severe pain or shortness of breath. Qty: 30 mL, Refills: 0    !! Morphine Sulfate (MORPHINE CONCENTRATE) 10 MG/0.5ML SOLN concentrated solution Take 0.5 mLs (10 mg total) by mouth every 2 (two) hours as needed for moderate pain, severe pain or shortness of breath. Qty: 30 mL, Refills: 0    prochlorperazine (COMPAZINE) 25 MG suppository Place 1 suppository (25 mg total) rectally every 12 (twelve) hours as needed for nausea or vomiting. Qty: 6 suppository, Refills: 0     !! -  Potential duplicate medications found. Please discuss with provider.    CONTINUE these medications which have CHANGED   Details  ARTIFICIAL TEARS 0.1-0.3 % SOLN Place 1 drop into both eyes as needed (dry eyes). Qty: 15 mL, Refills: 0    LORazepam (ATIVAN) 1 MG tablet Take 1 tablet (1 mg total) by mouth every 4 (four) hours as needed for anxiety. Qty: 15 tablet, Refills: 0       STOP taking these medications     albuterol (PROVENTIL) (2.5 MG/3ML) 0.083% nebulizer solution      atorvastatin (LIPITOR) 40 MG tablet      budesonide-formoterol (SYMBICORT) 160-4.5 MCG/ACT inhaler      buPROPion (WELLBUTRIN SR) 100 MG 12 hr tablet      citalopram (CELEXA) 40 MG tablet      fluticasone (FLONASE) 50 MCG/ACT nasal spray      furosemide (LASIX) 40 MG tablet      gabapentin (NEURONTIN) 300 MG capsule      insulin detemir (LEVEMIR) 100 UNIT/ML injection      isosorbide mononitrate (IMDUR) 30 MG 24 hr tablet      lisinopril (PRINIVIL,ZESTRIL) 10 MG tablet      loratadine (CLARITIN) 10 MG tablet      metFORMIN (GLUCOPHAGE) 1000 MG tablet      metoprolol tartrate (LOPRESSOR) 25 MG tablet      omeprazole (PRILOSEC) 20 MG capsule      promethazine (PHENERGAN) 25 MG tablet      tiotropium (SPIRIVA HANDIHALER) 18 MCG inhalation capsule      traZODone (DESYREL) 50 MG tablet      HYDROcodone-acetaminophen (NORCO/VICODIN) 5-325 MG tablet      lidocaine-prilocaine (EMLA) cream      prochlorperazine (COMPAZINE) 10 MG tablet        DISCHARGE INSTRUCTIONS:   DIET:  Regular diet  DISCHARGE CONDITION:  Fair  ACTIVITY:  Activity as tolerated  OXYGEN:  Home Oxygen: Yes.     Oxygen Delivery: 2 liters/min via Patient connected to nasal cannula oxygen for comfort care  DISCHARGE LOCATION:  Hospice home   If you experience worsening of your admission symptoms, develop shortness of breath, life threatening emergency, suicidal or homicidal thoughts you must seek medical attention immediately by calling 911 or calling your MD immediately  if symptoms less severe.  You Must read complete instructions/literature along with all the possible adverse reactions/side effects for all the Medicines you take and that have been prescribed to you. Take any new Medicines after you have completely understood and accpet all the possible adverse reactions/side effects.    Please note  You were cared for by a hospitalist during your hospital stay. If you have any questions about your discharge medications or the care you received while you were in the hospital after you are discharged, you can call the unit and asked to speak with the hospitalist on call if the hospitalist that took care of you is not available. Once you are discharged, your primary care physician will handle any further medical issues. Please note that NO REFILLS for any discharge medications will be authorized once you are discharged, as it is imperative that you return to your primary care physician (or establish a relationship with a primary care physician if you do not have one) for your aftercare needs so that they can reassess your need for medications and monitor your lab values.    On the day of Discharge:  VITAL SIGNS:  Blood pressure 87/48, pulse 111, temperature 97.5  F (36.4 C), temperature source Oral, resp. rate 22, height '5\' 5"'$  (1.651 m), weight 111.585 kg (246 lb), SpO2 97 %. PHYSICAL EXAMINATION:  GENERAL: 69 y.o.-year-old patient lying in the bed with no acute distress. Obese.  EYES: No scleral icterus. Extraocular muscles intact.  HEENT: Head atraumatic, normocephalic.on ventimask.  NECK: Supple, no jugular venous distention. No thyroid enlargement, no tenderness.  LUNGS: Diminished breath sounds bilaterally, upper airway wheezing, no rales,rhonchi or crepitation. No use of accessory muscles of respiration.  CARDIOVASCULAR: S1, S2 normal. No murmurs, rubs, or gallops.  ABDOMEN: Soft, nontender, distended. Bowel sounds present. No organomegaly or mass.  EXTREMITIES: No pedal edema, cyanosis, or clubbing.  NEUROLOGIC: unable to obtain. PSYCHIATRIC: The patient is lethargic and unresponsive to verbal stimuli. SKIN: No obvious rash, lesion, or ulcer.  DATA REVIEW:   CBC  Recent Labs Lab 05/08/15 0518  WBC 5.5  HGB 10.9*  HCT 32.9*  PLT 129*     Chemistries   Recent Labs Lab 05/08/15 0518  NA 133*  K 3.1*  CL 102  CO2 19*  GLUCOSE 195*  BUN 65*  CREATININE 2.56*  CALCIUM 8.0*  MG 2.0    Cardiac Enzymes No results for input(s): TROPONINI in the last 168 hours.  Microbiology Results  Results for orders placed or performed during the hospital encounter of 05/02/15  Culture, blood (routine x 2)     Status: None   Collection Time: 05/02/15 10:45 AM  Result Value Ref Range Status   Specimen Description BLOOD RIGHT HAND  Final   Special Requests BOTTLES DRAWN AEROBIC AND ANAEROBIC 3CC  Final   Culture NO GROWTH 5 DAYS  Final   Report Status 05/07/2015 FINAL  Final  Culture, blood (routine x 2)     Status: None   Collection Time: 05/02/15 11:03 AM  Result Value Ref Range Status   Specimen Description BLOOD RIGHT HAND  Final   Special Requests BOTTLES DRAWN AEROBIC AND ANAEROBIC 4CC  Final   Culture NO GROWTH 5 DAYS  Final   Report Status 05/07/2015 FINAL  Final  Culture, blood (routine x 2)     Status: None   Collection Time: 05/03/15  4:47 AM  Result Value Ref Range Status   Specimen Description BLOOD LEFT ANTECUBITAL  Final   Special Requests BOTTLES DRAWN AEROBIC AND ANAEROBIC 10ML  Final   Culture NO GROWTH 5 DAYS  Final   Report Status 05/08/2015 FINAL  Final  Culture, blood (routine x 2)     Status: None   Collection Time: 05/03/15  5:12 AM  Result Value Ref Range Status   Specimen Description BLOOD LEFT HAND  Final   Special Requests BOTTLES DRAWN AEROBIC AND ANAEROBIC 10ML  Final   Culture NO GROWTH 5 DAYS  Final   Report Status 05/08/2015 FINAL  Final  Stool culture     Status: None   Collection Time: 05/03/15  4:05 PM  Result Value Ref Range Status   Specimen Description STOOL  Final   Special Requests Immunocompromised  Final   Culture   Final    NO SALMONELLA OR SHIGELLA ISOLATED NO CAMPYLOBACTER DETECTED No Pathogenic E. coli detected    Report Status 05/06/2015 FINAL  Final  C difficile  quick scan w PCR reflex     Status: None   Collection Time: 05/03/15  4:05 PM  Result Value Ref Range Status   C Diff antigen NEGATIVE NEGATIVE Final   C Diff toxin NEGATIVE NEGATIVE Final   C  Diff interpretation Negative for C. difficile  Final  Culture, expectorated sputum-assessment     Status: None   Collection Time: 05/07/15  8:22 AM  Result Value Ref Range Status   Specimen Description EXPECTORATED SPUTUM  Final   Special Requests NONE  Final   Sputum evaluation   Final    Sputum specimen not acceptable for testing.  Please recollect.   Results Called to: [facility] AT 5945 ON 05/07/15 CTJ    Report Status 05/07/2015 FINAL  Final    RADIOLOGY:  US Renal  05/08/2015  CLINICAL DATA:  Acute renal failure. EXAM: RENAL / URINARY TRACT ULTRASOUND COMPLETE COMPARISON:  PET-CT 04/10/2015 FINDINGS: Right Kidney: Length: 9.6 cm. Renal parenchyma appears echogenic. No mass or hydronephrosis visualized. Left Kidney: Length: 9.3 cm. Renal parenchyma appears echogenic. No mass or hydronephrosis visualized. Bladder: Not seen Additional: Fluid filled dilated bowel loops are identified in acute abdomen, involving the upper and lower portions of the abdomen appear within the left upper quadrant, and significantly distended viscus is identified possibly the stomach. IMPRESSION: 1. No hydronephrosis.  Echogenic renal parenchyma bilaterally. 2. Bladder is not visualized. 3. Dilated bowel loops identified in the upper and lower abdomen. Possible bowel obstruction. Consider further evaluation with CT of the abdomen and pelvis to evaluate for bowel obstruction. 4. These results will be called to the ordering clinician or representative by the Radiologist Assistant, and communication documented in the PACS or zVision Dashboard. Electronically Signed   By: Nolon Nations M.D.   On: 05/08/2015 16:33     Management plans discussed with the patient, family and they are in agreement.  CODE STATUS: DO NOT  RESUSCITATE  TOTAL TIME TAKING CARE OF THIS PATIENT: 55 minutes.    [facility], Maybel Dambrosio M.D on 05/09/2015 at 11:43 AM  Between 7am to 6pm - Pager - [phone]  After 6pm go to [email or web address] - password [email or web address] [facility]  Office  [phone]  CC: Primary care physician; Theotis Burrow, MD

## 2015-05-09 NOTE — Care Management Important Message (Signed)
Important Message  Patient Details  Name: KATORIA YETMAN MRN: [id number] Date of Birth: 26-Feb-1946   Medicare Important Message Given:  Yes-third notification given    Shelbie Ammons, RN 05/09/2015, 8:29 AM

## 2015-05-09 NOTE — Progress Notes (Signed)
CSW notified that Pt and her family are agreeable to [facility] at this time. CSW followed up to offer support with family and will assist as needed.  Santiago Glad, [place] liaison coordinating transfer to hospice home.   Toma Copier, [place]

## 2015-05-09 NOTE — Discharge Instructions (Signed)
Hospice °Hospice is a service that is designed to provide people who are terminally ill and their families with medical, spiritual, and psychological support. Its aim is to improve your quality of life by keeping you as alert and comfortable as possible. Hospice is performed by a team of health care professionals and volunteers who: °· Help keep you comfortable. Hospice can be provided in your home or in a homelike setting. The hospice staff works with your family and friends to help meet your needs. You will enjoy the support of loved ones by receiving much of your basic care from family and friends. °· Provide pain relief and manage your symptoms. The staff supply all necessary medicines and equipment. °· Provide companionship when you are alone. °· Allow you and your family to rest. They may do light housekeeping, prepare meals, and run errands. °· Provide counseling. They will make sure your emotional, spiritual, and social needs and those of your family are being met. °· Provide spiritual care. Spiritual care is individualized to meet your needs and your family's needs. It may involve helping you look at what death means to you, say goodbye, or perform a specific religious ceremony or ritual. °Hospice teams often include: °· A nurse. °· A doctor. °· Social workers. °· Religious leaders (such as a chaplain). °· Trained volunteers. °WHEN SHOULD HOSPICE CARE BEGIN? °Most people who use hospice are believed to have fewer than 6 months to live. Your family and health care providers can help you decide when hospice services should begin. If your condition improves, you may discontinue the program. °WHAT SHOULD I CONSIDER BEFORE SELECTING A PROGRAM? °Most hospice programs are run by nonprofit, independent organizations. Some are affiliated with hospitals, nursing homes, or home health care agencies. Hospice programs can take place in the home or at a hospice center, hospital, or skilled nursing facility. When choosing  a hospice program, ask the following questions: °· What services are available to me? °· What services are offered to my loved ones? °· How involved are my loved ones? °· How involved is my health care provider? °· Who makes up the hospice care team? How are they trained or screened? °· How will my pain and symptoms be managed? °· If my circumstances change, can the services be provided in a different setting, such as my home or in the hospital? °· Is the program reviewed and licensed by the state or certified in some other way? °WHERE CAN I LEARN MORE ABOUT HOSPICE? °You can learn about existing hospice programs in your area from your health care providers. You can also read more about hospice online. The websites of the following organizations contain helpful information: °· The National Hospice and Palliative Care Organization (NHPCO). °· The Hospice Association of America (HAA). °· The Hospice Education Institute. °· The American Cancer Society (ACS). °· Hospice Net. °  °This information is not intended to replace advice given to you by your health care provider. Make sure you discuss any questions you have with your health care provider. °  °Document Released: 10/03/2003 Document Revised: 06/21/2013 Document Reviewed: 04/26/2013 °Elsevier Interactive Patient Education ©2016 Elsevier Inc. ° °

## 2015-05-09 NOTE — Progress Notes (Signed)
   05/09/15 0930  Clinical Encounter Type  Visited With Patient and family together  Visit Type Follow-up;Spiritual support  Referral From Chaplain  Consult/Referral To Chaplain  Spiritual Encounters  Spiritual Needs Emotional;Prayer  Stress Factors  Family Stress Factors Health changes;Major life changes  Follow up visit to patient and family.  Accompanied by Hospice nurse.  Provided pastoral presence, prayer and spiritual support to patient and family members. [place] [phone]

## 2015-05-09 NOTE — Progress Notes (Signed)
New hospice home referral received from Dr. Megan Salon on 11/8. Patient has been weaned off Bipap and is currently on 3 liters of oxygen via nasal cannula. Patient has stage IV small cell lung cancer, she was admitted to [facility] on 11/2 for treatment of nausea/vomiting. PMH includes: Breast Ca, HTN, COPD, DM II and CKD. She has continued to decline with acute respiratory failure, acute renal failure and possible bowel obstruction. Family has chosen to focus on comfort care at the hospice home. Patient is a DNR code.  Patient seen lying in bed, 15-20 second periods of apnea noted, audible congestion noted. She is currently receiving IV robinul, zofran and reglan as well as oral morphine for control of symptoms. No mottling noted, hands and feet warm to touch, she did close her mouth/react to mouth care. Writer spoke with family members present, Daughter in law [place], and patient's son Shanon Brow as well as an adult granddaughter. Discussed transfer to the hospice home, all in agreement. Consents signed and faxed to referral. Dr. Megan Salon, attending physician Dr. Manuella Ghazi, [facility] Hassan Rowan, [place] and staff RN Andee Poles all aware of plan for transfer to the hospice home via EMS with portable DNR in place.  Thank you for the opportunity to assist in the care of this patient and her family. Flo Shanks, RN, BSN, [facility] and Palliative Care of [place], [facility] [phone] c

## 2015-05-09 NOTE — Progress Notes (Signed)
Palliative Medicine Inpatient Consult Follow Up Note   Name: Sherry Prince Date: 05/09/2015 MRN: [id number]  DOB: Jan 24, 1946  Referring Physician: No att. providers found  Palliative Care consult requested for this 69 y.o. female for goals of medical therapy in patient with small cell lung cancer.    TODAY'S DISCUSSIONS AND DECISIONS: 1. Pt is already DNR. I placed portable document in the paper chart  2. Family is all in agreement with terminal comfort care.    3. Pt is to go to [facility] today.  Hospice Liaison aware and this is being worked on.  I have done the DC med rec.  IMPRESSION: 1. Small Cell Lung Cancer --stage IV 'extensive'  ---had chemo put on hold by oncologist (Dr Sherry Prince) ---extensive mediastinal left hilar subcarinal and contralateral paratracheal adenopathy ---numerous hypermetabolic masses in the liver c/w hepatic mets. ---palliative radiation given Oct 13 2. Neutropenic Fever 3. Essential HTN 4. Obstructive Sleep Apnea 5. COPD 6. Intermitteent Diarrhea 7. H/O stage IV breast cancer ---modified radical mastc with adjuvant chemo but no radiation (remotely at [facility]).  8. Pancytopenia 9. GERD 10. DM2 11. Acute on Chronic Respiratory Failure with hypoxemia 12. Metabolic acidosis 13. Hypotension 14. Suspected pneumonia vs bronchitis with negative CXR 15. Acute renal failure on CKD stage  16. Metabolic Acidosis 17. Nausea and Vomiting  18. Severe Malnutrition 19. Former Smoker 31. Anemia due to chemo --transfuesed and given Neupogen as of Nov 6.    REVIEW OF SYSTEMS:  Patient is not able to provide ROS due to dying process  CODE STATUS: DNR   PAST MEDICAL HISTORY: Past Medical History  Diagnosis Date  . Diabetes mellitus without complication ([place])   . Chronic kidney disease   . GERD (gastroesophageal reflux disease)   . Hypertension   . Ear pain   . Serum calcium elevated   . Floaters   . Sleep apnea   .  Cancer ([place])   . Liver cancer ([place])   . Cervical cancer ([place])   . Bone cancer ([place])   . CHF (congestive heart failure) ([place])   . COPD (chronic obstructive pulmonary disease) ([place])     PAST SURGICAL HISTORY:  Past Surgical History  Procedure Laterality Date  . Abdominal hysterectomy    . Breast surgery    . Eye surgery    . Endobronchial ultrasound N/A 03/29/2015    Procedure: ENDOBRONCHIAL ULTRASOUND;  Surgeon: Flora Lipps, MD;  Location: [facility] ORS;  Service: Cardiopulmonary;  Laterality: N/A;    Vital Signs: BP 87/48 mmHg  Pulse 111  Temp(Src) 97.5 F (36.4 C) (Oral)  Resp 22  Ht '5\' 5"'  (1.651 m)  Wt 111.585 kg (246 lb)  BMI 40.94 kg/m2  SpO2 97% Filed Weights   05/02/15 0524  Weight: 111.585 kg (246 lb)    Estimated body mass index is 40.94 kg/(m^2) as calculated from the following:   Height as of this encounter: '5\' 5"'  (1.651 m).   Weight as of this encounter: 111.585 kg (246 lb).  PHYSICAL EXAM: She has ronchi noted and also wet gurgling sounds are heard Eyes closed Lying in medical bed with family bedside Hrt rrr no mgr Lungs with some ronchi Abd distant bowel sounds Skin warm and dry but with cyanosis of finger and toenails noted.  LABS: CBC:    Component Value Date/Time   WBC 5.5 05/08/2015 0518   WBC 12.2* 06/18/2014 1125   HGB 10.9* 05/08/2015 0518   HGB 12.0 06/18/2014 1125   HCT  32.9* 05/08/2015 0518   HCT 37.9 06/18/2014 1125   PLT 129* 05/08/2015 0518   PLT 315 06/18/2014 1125   MCV 86.1 05/08/2015 0518   MCV 91 06/18/2014 1125   NEUTROABS 4.4 05/08/2015 0518   NEUTROABS 14.3* 03/25/2014 0229   LYMPHSABS 0.4* 05/08/2015 0518   LYMPHSABS 0.8* 03/25/2014 0229   MONOABS 0.6 05/08/2015 0518   MONOABS 0.2 03/25/2014 0229   EOSABS 0.1 05/08/2015 0518   EOSABS 0.0 03/25/2014 0229   BASOSABS 0.0 05/08/2015 0518   BASOSABS 0.0 03/25/2014 0229   Comprehensive Metabolic Panel:    Component Value Date/Time   NA 133* 05/08/2015 0518   NA 136  06/18/2014 1125   K 3.1* 05/08/2015 0518   K 4.1 06/18/2014 1125   CL 102 05/08/2015 0518   CL 102 06/18/2014 1125   CO2 19* 05/08/2015 0518   CO2 29 06/18/2014 1125   BUN 65* 05/08/2015 0518   BUN 12 06/18/2014 1125   CREATININE 2.56* 05/08/2015 0518   CREATININE 0.98 06/18/2014 1125   GLUCOSE 195* 05/08/2015 0518   GLUCOSE 209* 06/18/2014 1125   CALCIUM 8.0* 05/08/2015 0518   CALCIUM 8.2* 06/18/2014 1125   AST 21 05/02/2015 0531   AST 17 03/24/2014 0147   ALT 25 05/02/2015 0531   ALT 19 03/24/2014 0147   ALKPHOS 183* 05/02/2015 0531   ALKPHOS 90 03/24/2014 0147   BILITOT 0.8 05/02/2015 0531   BILITOT 0.3 03/24/2014 0147   PROT 6.6 05/02/2015 0531   PROT 6.5 03/24/2014 0147   ALBUMIN 3.1* 05/02/2015 0531   ALBUMIN 2.9* 03/24/2014 0147    More than 50% of the visit was spent in counseling/coordination of care: YES  Time Spent: 25 min

## 2015-05-09 NOTE — Care Management (Signed)
Discharge to [facility] per Dr. Manuella Ghazi. Family members are in agreement. Will transport per [facility]. Shelbie Ammons RN MSN CCM Care Management [phone]

## 2015-05-09 NOTE — Progress Notes (Signed)
   05/09/15 0100  Clinical Encounter Type  Visited With Patient and family together  Visit Type Follow-up;Spiritual support  Referral From Chaplain;Nurse  Consult/Referral To Chaplain  Spiritual Encounters  Spiritual Needs Prayer;Emotional;Grief support  Stress Factors  Patient Stress Factors Not reviewed  Family Stress Factors Health changes  Chaplain was paged to come to patient's room. Reported to the room and introduced self and offered assistance. Family requested prayer and we prayed. Offered a listening ear and a compassionate presence. Chaplain Ashwin Tibbs A. Emaya [place] Ext. [phone]

## 2015-05-10 ENCOUNTER — Ambulatory Visit: Payer: Medicare Other

## 2015-05-10 LAB — VITAMIN D 1,25 DIHYDROXY
Vitamin D 1, 25 (OH)2 Total: 53 pg/mL
Vitamin D2 1, 25 (OH)2: <10 pg/mL
Vitamin D3 1, 25 (OH)2: 52 pg/mL

## 2015-05-11 ENCOUNTER — Ambulatory Visit: Payer: Medicare Other

## 2015-05-11 LAB — GLUCOSE, CAPILLARY: GLUCOSE-CAPILLARY: 144 mg/dL — AB (ref 65–99)

## 2015-05-14 ENCOUNTER — Inpatient Hospital Stay: Payer: Medicare Other | Admitting: Oncology

## 2015-05-14 ENCOUNTER — Ambulatory Visit: Payer: Medicare Other

## 2015-05-14 ENCOUNTER — Other Ambulatory Visit: Payer: Medicare Other

## 2015-05-15 ENCOUNTER — Ambulatory Visit: Payer: Medicare Other

## 2015-05-16 ENCOUNTER — Ambulatory Visit: Payer: Medicare Other

## 2015-05-17 ENCOUNTER — Ambulatory Visit: Payer: Medicare Other

## 2015-05-18 ENCOUNTER — Ambulatory Visit: Payer: Medicare Other

## 2015-05-21 ENCOUNTER — Ambulatory Visit: Payer: Medicare Other

## 2015-05-22 ENCOUNTER — Ambulatory Visit: Payer: Medicare Other

## 2015-05-23 ENCOUNTER — Ambulatory Visit: Payer: Medicare Other

## 2015-05-28 ENCOUNTER — Ambulatory Visit: Payer: Medicare Other

## 2015-05-29 ENCOUNTER — Ambulatory Visit: Payer: Medicare Other

## 2015-05-30 ENCOUNTER — Ambulatory Visit: Payer: Medicare Other

## 2015-05-31 ENCOUNTER — Ambulatory Visit: Payer: Medicare Other

## 2015-05-31 DEATH — deceased

## 2015-06-01 ENCOUNTER — Ambulatory Visit: Payer: Medicare Other

## 2015-06-04 ENCOUNTER — Ambulatory Visit: Payer: Medicare Other

## 2015-06-27 ENCOUNTER — Other Ambulatory Visit: Payer: Self-pay | Admitting: Nurse Practitioner
